# Patient Record
Sex: Female | Born: 1945 | Race: White | Hispanic: No
Health system: Midwestern US, Community
[De-identification: ages and names within clinical notes are randomized; demographics above are authoritative.]

## PROBLEM LIST (undated history)

## (undated) DIAGNOSIS — Z860100 Personal history of colon polyps, unspecified: Secondary | ICD-10-CM

## (undated) DIAGNOSIS — T7840XA Allergy, unspecified, initial encounter: Secondary | ICD-10-CM

## (undated) DIAGNOSIS — E785 Hyperlipidemia, unspecified: Secondary | ICD-10-CM

## (undated) DIAGNOSIS — J302 Other seasonal allergic rhinitis: Secondary | ICD-10-CM

## (undated) DIAGNOSIS — N200 Calculus of kidney: Secondary | ICD-10-CM

## (undated) DIAGNOSIS — Z789 Other specified health status: Secondary | ICD-10-CM

## (undated) DIAGNOSIS — G4733 Obstructive sleep apnea (adult) (pediatric): Secondary | ICD-10-CM

## (undated) DIAGNOSIS — D519 Vitamin B12 deficiency anemia, unspecified: Secondary | ICD-10-CM

## (undated) DIAGNOSIS — M1990 Unspecified osteoarthritis, unspecified site: Secondary | ICD-10-CM

## (undated) DIAGNOSIS — E119 Type 2 diabetes mellitus without complications: Secondary | ICD-10-CM

## (undated) DIAGNOSIS — G4730 Sleep apnea, unspecified: Secondary | ICD-10-CM

## (undated) DIAGNOSIS — Z952 Presence of prosthetic heart valve: Secondary | ICD-10-CM

## (undated) DIAGNOSIS — R011 Cardiac murmur, unspecified: Secondary | ICD-10-CM

## (undated) DIAGNOSIS — Z5189 Encounter for other specified aftercare: Secondary | ICD-10-CM

## (undated) DIAGNOSIS — N201 Calculus of ureter: Secondary | ICD-10-CM

## (undated) DIAGNOSIS — Z87442 Personal history of urinary calculi: Secondary | ICD-10-CM

## (undated) DIAGNOSIS — I10 Essential (primary) hypertension: Secondary | ICD-10-CM

## (undated) DIAGNOSIS — J4520 Mild intermittent asthma, uncomplicated: Secondary | ICD-10-CM

## (undated) HISTORY — PX: EXTRACORPOREAL SHOCK WAVE LITHOTRIPSY: SHX1557

## (undated) HISTORY — PX: UPPER GASTROINTESTINAL ENDOSCOPY: SHX188

## (undated) HISTORY — DX: Essential (primary) hypertension: I10

## (undated) HISTORY — DX: Unspecified osteoarthritis, unspecified site: M19.90

## (undated) HISTORY — PX: CARDIAC VALVE REPLACEMENT: SHX585

## (undated) HISTORY — PX: EYE SURGERY: SHX253

## (undated) HISTORY — DX: Sleep apnea, unspecified: G47.30

## (undated) HISTORY — DX: Encounter for other specified aftercare: Z51.89

## (undated) HISTORY — PX: AORTIC VALVE REPLACEMENT: SHX41

## (undated) HISTORY — PX: COLONOSCOPY: SHX174

## (undated) HISTORY — DX: Cardiac murmur, unspecified: R01.1

## (undated) HISTORY — DX: Other specified health status: Z78.9

## (undated) HISTORY — PX: POLYPECTOMY: SHX149

## (undated) HISTORY — DX: Allergy, unspecified, initial encounter: T78.40XA

## (undated) HISTORY — PX: ESOPHAGOGASTRODUODENOSCOPY: SHX1529

---
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
## 1976-12-09 HISTORY — PX: CHOLECYSTECTOMY: SHX55

## 2006-11-16 ENCOUNTER — Emergency Department: Payer: Self-pay | Admitting: Internal Medicine

## 2006-12-09 LAB — HM DEXA SCAN

## 2007-01-07 ENCOUNTER — Ambulatory Visit: Payer: Self-pay | Admitting: Family Medicine

## 2007-01-08 ENCOUNTER — Ambulatory Visit: Payer: Self-pay | Admitting: Family Medicine

## 2007-01-13 ENCOUNTER — Ambulatory Visit: Payer: Self-pay | Admitting: Specialist

## 2007-02-24 ENCOUNTER — Ambulatory Visit: Payer: Self-pay | Admitting: Gastroenterology

## 2007-02-24 ENCOUNTER — Encounter: Payer: Self-pay | Admitting: Gastroenterology

## 2007-06-18 ENCOUNTER — Ambulatory Visit: Payer: Self-pay

## 2007-06-18 LAB — CONVERTED CEMR LAB
Cholesterol: 235 mg/dL (ref 0–200)
HDL: 39.6 mg/dL (ref >39.0)
Triglycerides: 143 mg/dL (ref 0–149)

## 2007-10-02 ENCOUNTER — Other Ambulatory Visit: Payer: Self-pay

## 2007-10-02 ENCOUNTER — Emergency Department: Payer: Self-pay | Admitting: Emergency Medicine

## 2007-11-14 ENCOUNTER — Emergency Department: Payer: Self-pay | Admitting: Emergency Medicine

## 2008-07-25 DIAGNOSIS — L259 Unspecified contact dermatitis, unspecified cause: Secondary | ICD-10-CM

## 2009-04-26 ENCOUNTER — Emergency Department: Payer: Self-pay | Admitting: Emergency Medicine

## 2009-05-04 ENCOUNTER — Encounter: Payer: Self-pay | Admitting: Gastroenterology

## 2009-07-28 ENCOUNTER — Ambulatory Visit: Payer: Self-pay | Admitting: Gastroenterology

## 2009-07-28 DIAGNOSIS — R1012 Left upper quadrant pain: Secondary | ICD-10-CM

## 2009-07-28 LAB — CONVERTED CEMR LAB
ALT: 11 units/L (ref 0–35)
AST: 17 units/L (ref 0–37)
Alkaline Phosphatase: 39 units/L (ref 39–117)
Basophils Absolute: 0 10*3/uL (ref 0.0–0.1)
CO2: 32 meq/L (ref 19–32)
Creatinine, Ser: 0.6 mg/dL (ref 0.4–1.2)
Eosinophils Absolute: 0.1 10*3/uL (ref 0.0–0.7)
GFR calc non Af Amer: 107.54 mL/min (ref >60)
HCT: 40.6 % (ref 36.0–46.0)
Hemoglobin: 13.4 g/dL (ref 12.0–15.0)
Lymphs Abs: 1.2 10*3/uL (ref 0.7–4.0)
MCHC: 33 g/dL (ref 30.0–36.0)
Monocytes Absolute: 0.4 10*3/uL (ref 0.1–1.0)
Neutro Abs: 3.1 10*3/uL (ref 1.4–7.7)
Platelets: 108 10*3/uL — ABNORMAL LOW (ref 150.0–400.0)
RDW: 13.8 % (ref 11.5–14.6)
Sodium: 141 meq/L (ref 135–145)
Total Bilirubin: 0.9 mg/dL (ref 0.3–1.2)

## 2009-08-01 ENCOUNTER — Telehealth (INDEPENDENT_AMBULATORY_CARE_PROVIDER_SITE_OTHER): Payer: Self-pay | Admitting: *Deleted

## 2009-08-17 ENCOUNTER — Telehealth: Payer: Self-pay | Admitting: Gastroenterology

## 2009-08-17 ENCOUNTER — Ambulatory Visit: Payer: Self-pay | Admitting: Gastroenterology

## 2009-08-23 ENCOUNTER — Encounter: Payer: Self-pay | Admitting: Gastroenterology

## 2009-08-23 ENCOUNTER — Ambulatory Visit: Payer: Self-pay | Admitting: Gastroenterology

## 2009-08-24 ENCOUNTER — Encounter (INDEPENDENT_AMBULATORY_CARE_PROVIDER_SITE_OTHER): Payer: Self-pay | Admitting: *Deleted

## 2010-02-14 ENCOUNTER — Encounter (INDEPENDENT_AMBULATORY_CARE_PROVIDER_SITE_OTHER): Payer: Self-pay | Admitting: *Deleted

## 2010-09-12 ENCOUNTER — Telehealth: Payer: Self-pay | Admitting: Gastroenterology

## 2010-10-18 ENCOUNTER — Encounter (INDEPENDENT_AMBULATORY_CARE_PROVIDER_SITE_OTHER): Payer: Self-pay | Admitting: *Deleted

## 2010-12-24 ENCOUNTER — Encounter (INDEPENDENT_AMBULATORY_CARE_PROVIDER_SITE_OTHER): Payer: Self-pay | Admitting: *Deleted

## 2010-12-27 ENCOUNTER — Ambulatory Visit
Admission: RE | Admit: 2010-12-27 | Discharge: 2010-12-27 | Payer: Self-pay | Source: Home / Self Care | Attending: Gastroenterology | Admitting: Gastroenterology

## 2010-12-27 ENCOUNTER — Ambulatory Visit: Admit: 2010-12-27 | Payer: Self-pay | Admitting: Gastroenterology

## 2011-01-09 NOTE — Letter (Signed)
Summary: Pre Visit Letter Revised  [facility]  [address] [place], [place] [address]   Phone: [phone]  Fax: [phone]      10/18/2010 MRN: [id number]    [facility] [address] [place], [place]  [address]             Procedure Date:  January 09, 2011   Welcome to the Gastroenterology Division at [facility].    You are scheduled to see a nurse for your pre-procedure visit on THURSDAY, December 27, 2010 at 8:00 A.M. on the 3rd floor at [facility], [address]  We ask that you try to arrive at our office 15 minutes prior to your appointment time to allow for check-in.  Please take a minute to review the attached form.  If you answer "Yes" to one or more of the questions on the first page, we ask that you call the person listed at your earliest opportunity.  If you answer "No" to all of the questions, please complete the rest of the form and bring it to your appointment.    Your nurse visit will consist of discussing your medical and surgical history, your immediate family medical history, and your medications.   If you are unable to list all of your medications on the form, please bring the medication bottles to your appointment and we will list them.  We will need to be aware of both prescribed and over the counter drugs.  We will need to know exact dosage information as well.    Please be prepared to read and sign documents such as consent forms, a financial agreement, and acknowledgement forms.  If necessary, and with your consent, a friend or relative is welcome to sit-in on the nurse visit with you.  Please bring your insurance card so that we may make a copy of it.  If your insurance requires a referral to see a specialist, please bring your referral form from your primary care physician.  No co-pay is required for this nurse visit.     If you cannot keep your appointment, please call [phone] to cancel or reschedule prior to  your appointment date.  This allows [place] the opportunity to schedule an appointment for another patient in need of care.    Thank you for choosing [facility] for your medical needs.  We appreciate the opportunity to care for you.  Please visit [place] at our website  to learn more about our practice.  Sincerely, The Gastroenterology Division

## 2011-01-09 NOTE — Progress Notes (Signed)
Summary: Schedule Colonoscopy or Office Visit  Phone Note Outgoing Call Call back at [facility] Phone [phone] Call back at Work Phone [phone]   Call placed by: Harlow Mares CMA Duncan Dull),  September 12, 2010 9:05 AM Call placed to: Patient Summary of Call: patients home number is disconnected I called her work number and left a message on her voicemail. patient ns her last office visit and is due for a colonoscopy. She can make either appt. but can have a direct colonoscopy if she wants to just not have another ov. Left a message on patients machine to call back.  Initial call taken by: Harlow Mares CMA Duncan Dull),  September 12, 2010 9:06 AM  Follow-up for Phone Call        left another voicemail on the patients work voicemail.  Left a message on the patient machine to call back and schedule a previsit and procedure with our office. A letter will be mailed to the patient.   Follow-up by: Harlow Mares CMA Duncan Dull),  September 21, 2010 8:53 AM

## 2011-01-09 NOTE — Letter (Signed)
Summary: Colonoscopy Letter  [facility]  [address] [place], [place] [address]   Phone: [phone]  Fax: [phone]      February 14, 2010 MRN: [id number]   [facility] [address] [place], [place]  [address]   Dear Ms. Boothe,   According to your medical record, it is time for you to schedule a Colonoscopy. The American Cancer Society recommends this procedure as a method to detect early colon cancer. Patients with a family history of colon cancer, or a personal history of colon polyps or inflammatory bowel disease are at increased risk.  This letter has been generated based on the recommendations made at the time of your procedure. If you feel that in your particular situation this may no longer apply, please contact our office.  Please call our office at [phone] to schedule this appointment or to update your records at your earliest convenience.  Thank you for cooperating with [place] to provide you with the very best care possible.   Sincerely,  Rachael Fee, M.D.  [facility] Gastroenterology Division [phone]

## 2011-01-10 NOTE — Letter (Signed)
Summary: Moviprep Instructions  [facility]  [address]   [place], [place] [address]   Phone: [phone]  Fax: [phone]       Bailey [name]    03-Jan-1946    MRN: [id number]        Procedure Day /Date: Friday 01-18-11     Arrival Time: 9:00 a.m.     Procedure Time: 10:00 a.m.     Location of Procedure:                     x   [facility] (4th Floor)                        PREPARATION FOR COLONOSCOPY WITH MOVIPREP   Starting 5 days prior to your procedure 01-13-11 do not eat nuts, seeds, popcorn, corn, beans, peas,  salads, or any raw vegetables.  Do not take any fiber supplements (e.g. Metamucil, Citrucel, and Benefiber).  THE DAY BEFORE YOUR PROCEDURE         DATE: 01-17-11   DAY: Thursday  1.  Drink clear liquids the entire day-NO SOLID FOOD  2.  Do not drink anything colored red or purple.  Avoid juices with pulp.  No orange juice.  3.  Drink at least 64 oz. (8 glasses) of fluid/clear liquids during the day to prevent dehydration and help the prep work efficiently.  CLEAR LIQUIDS INCLUDE: Water Jello Ice Popsicles Tea (sugar ok, no milk/cream) Powdered fruit flavored drinks Coffee (sugar ok, no milk/cream) Gatorade Juice: apple, white grape, white cranberry  Lemonade Clear bullion, consomm, broth Carbonated beverages (any kind) Strained chicken noodle soup Hard Candy                             4.  In the morning, mix first dose of MoviPrep solution:    Empty 1 Pouch A and 1 Pouch B into the disposable container    Add lukewarm drinking water to the top line of the container. Mix to dissolve    Refrigerate (mixed solution should be used within 24 hrs)  5.  Begin drinking the prep at 5:00 p.m. The MoviPrep container is divided by 4 marks.   Every 15 minutes drink the solution down to the next mark (approximately 8 oz) until the full liter is complete.   6.  Follow completed prep with 16 oz of clear liquid of your choice  (Nothing red or purple).  Continue to drink clear liquids until bedtime.  7.  Before going to bed, mix second dose of MoviPrep solution:    Empty 1 Pouch A and 1 Pouch B into the disposable container    Add lukewarm drinking water to the top line of the container. Mix to dissolve    Refrigerate  THE DAY OF YOUR PROCEDURE      DATE: 01-18-11 DAY: Friday  Beginning at 5:00 a.m. (5 hours before procedure):         1. Every 15 minutes, drink the solution down to the next mark (approx 8 oz) until the full liter is complete.  2. Follow completed prep with 16 oz. of clear liquid of your choice.    3. You may drink clear liquids until 8:00 a.m. (2 HOURS BEFORE PROCEDURE).   MEDICATION INSTRUCTIONS  Unless otherwise instructed, you should take regular prescription medications with a small sip of water   as early as possible the  morning of your procedure.       OTHER INSTRUCTIONS  You will need a responsible adult at least 64 years of age to accompany you and drive you home.   This person must remain in the waiting room during your procedure.  Wear loose fitting clothing that is easily removed.  Leave jewelry and other valuables at home.  However, you may wish to bring a book to read or  an iPod/MP3 player to listen to music as you wait for your procedure to start.  Remove all body piercing jewelry and leave at home.  Total time from sign-in until discharge is approximately 2-3 hours.  You should go home directly after your procedure and rest.  You can resume normal activities the  day after your procedure.  The day of your procedure you should not:   Drive   Make legal decisions   Operate machinery   Drink alcohol   Return to work  You will receive specific instructions about eating, activities and medications before you leave.    The above instructions have been reviewed and explained to me by   Durwin Glaze RN  December 27, 2010 9:19 AM     I fully understand  and can verbalize these instructions _____________________________ Date _________

## 2011-01-10 NOTE — Miscellaneous (Signed)
Summary: [facility]  Clinical Lists Changes  Medications: Added new medication of MOVIPREP 100 GM  SOLR (PEG-KCL-NACL-NASULF-NA ASC-C) As per prep instructions. - Signed Added new medication of MOVIPREP 100 GM  SOLR (PEG-KCL-NACL-NASULF-NA ASC-C) As per prep instructions. - Signed Rx of MOVIPREP 100 GM  SOLR (PEG-KCL-NACL-NASULF-NA ASC-C) As per prep instructions.;  #1 x 0;  Signed;  Entered by: Durwin Glaze RN;  Authorized by: Rachael Fee MD;  Method used: Electronically to [facility] # [phone]*, [address], [place], [place]  [address], Ph: [phone], Fax: [phone] Rx of MOVIPREP 100 GM  SOLR (PEG-KCL-NACL-NASULF-NA ASC-C) As per prep instructions.;  #1 x 0;  Signed;  Entered by: Durwin Glaze RN;  Authorized by: Rachael Fee MD;  Method used: Electronically to [facility] # [phone]*, [address], [place], [place]  [address], Ph: [phone], Fax: [phone] Observations: Added new observation of ALLERGY REV: Done (12/27/2010 8:43)    Prescriptions: MOVIPREP 100 GM  SOLR (PEG-KCL-NACL-NASULF-NA ASC-C) As per prep instructions.  #1 x 0   Entered by:   Durwin Glaze RN   Authorized by:   Rachael Fee MD   Signed by:   Durwin Glaze RN on 12/27/2010   Method used:   Electronically to        [facility] # [phone]* (retail)       [address]       [place], [place]  [address]       Ph: [phone]       Fax: [phone]   RxID:   [id number] MOVIPREP 100 GM  SOLR (PEG-KCL-NACL-NASULF-NA ASC-C) As per prep instructions.  #1 x 0   Entered by:   Durwin Glaze RN   Authorized by:   Rachael Fee MD   Signed by:   Durwin Glaze RN on 12/27/2010   Method used:   Electronically to        [facility] # [phone]* (retail)       [address]       [place], [place]  [address]       Ph: [phone]       Fax: [phone]   RxID:   [id number]

## 2011-01-17 ENCOUNTER — Encounter (INDEPENDENT_AMBULATORY_CARE_PROVIDER_SITE_OTHER): Payer: Self-pay | Admitting: *Deleted

## 2011-01-17 ENCOUNTER — Telehealth (INDEPENDENT_AMBULATORY_CARE_PROVIDER_SITE_OTHER): Payer: Self-pay | Admitting: *Deleted

## 2011-01-17 DIAGNOSIS — R69 Illness, unspecified: Secondary | ICD-10-CM

## 2011-01-18 ENCOUNTER — Other Ambulatory Visit: Payer: Self-pay | Admitting: Gastroenterology

## 2011-01-24 NOTE — Letter (Signed)
Summary: [facility] Instructions  [facility]  [address] [place], [place] [address]   Phone: [phone]  Fax: [phone]       Bailey [name]    23-Oct-1946    MRN: [id number]        Procedure Day /Date: Tuesday 02/19/2011      Arrival Time: 1:00 pm     Procedure Time: 2:00 pm     Location of Procedure:                    _x _  [facility] (4th Floor)   PREPARATION FOR COLONOSCOPY WITH MOVIPREP   Starting 5 days prior to your procedure Thursday 3/8 do not eat nuts, seeds, popcorn, corn, beans, peas,  salads, or any raw vegetables.  Do not take any fiber supplements (e.g. Metamucil, Citrucel, and Benefiber).  THE DAY BEFORE YOUR PROCEDURE         DATE: Monday 3/12  1.  Drink clear liquids the entire day-NO SOLID FOOD  2.  Do not drink anything colored red or purple.  Avoid juices with pulp.  No orange juice.  3.  Drink at least 64 oz. (8 glasses) of fluid/clear liquids during the day to prevent dehydration and help the prep work efficiently.  CLEAR LIQUIDS INCLUDE: Water Jello Ice Popsicles Tea (sugar ok, no milk/cream) Powdered fruit flavored drinks Coffee (sugar ok, no milk/cream) Gatorade Juice: apple, white grape, white cranberry  Lemonade Clear bullion, consomm, broth Carbonated beverages (any kind) Strained chicken noodle soup Hard Candy                             4.  In the morning, mix first dose of MoviPrep solution:    Empty 1 Pouch A and 1 Pouch B into the disposable container    Add lukewarm drinking water to the top line of the container. Mix to dissolve    Refrigerate (mixed solution should be used within 24 hrs)  5.  Begin drinking the prep at 5:00 p.m. The MoviPrep container is divided by 4 marks.   Every 15 minutes drink the solution down to the next mark (approximately 8 oz) until the full liter is complete.   6.  Follow completed prep with 16 oz of clear liquid of your choice (Nothing red or purple).   Continue to drink clear liquids until bedtime.  7.  Before going to bed, mix second dose of MoviPrep solution:    Empty 1 Pouch A and 1 Pouch B into the disposable container    Add lukewarm drinking water to the top line of the container. Mix to dissolve    Refrigerate  THE DAY OF YOUR PROCEDURE      DATE: Tuesday 3/13  Beginning at 9:00 a.m. (5 hours before procedure):         1. Every 15 minutes, drink the solution down to the next mark (approx 8 oz) until the full liter is complete.  2. Follow completed prep with 16 oz. of clear liquid of your choice.    3. You may drink clear liquids until 12:00 pm (2 HOURS BEFORE PROCEDURE).   MEDICATION INSTRUCTIONS  Unless otherwise instructed, you should take regular prescription medications with a small sip of water   as early as possible the morning of your procedure.  Diabetic patients - see separate instructions.          OTHER INSTRUCTIONS  You  will need a responsible adult at least 64 years of age to accompany you and drive you home.   This person must remain in the waiting room during your procedure.  Wear loose fitting clothing that is easily removed.  Leave jewelry and other valuables at home.  However, you may wish to bring a book to read or  an iPod/MP3 player to listen to music as you wait for your procedure to start.  Remove all body piercing jewelry and leave at home.  Total time from sign-in until discharge is approximately 2-3 hours.  You should go home directly after your procedure and rest.  You can resume normal activities the  day after your procedure.  The day of your procedure you should not:   Drive   Make legal decisions   Operate machinery   Drink alcohol   Return to work  You will receive specific instructions about eating, activities and medications before you leave.    The above instructions have been reviewed and explained to me by   _______________________    I fully  understand and can verbalize these instructions _____________________________ Date _________  INSTRUCTIONS MAILED TO PT.  SHE WILL CALL PREVISIT TO REVIEW INSTRUCTIONS OVER TELEPHONE. [phone]

## 2011-01-24 NOTE — Letter (Signed)
Summary: Diabetic Instructions  [facility]  [address] [place], [place] [address]   Phone: [phone]  Fax: [phone]    SHERECE GAMBRILL 1946/05/07 MRN: [id number]   _  _   ORAL DIABETIC MEDICATION INSTRUCTIONS  The day before your procedure:   Take your diabetic pill as you do normally  The day of your procedure:   Do not take your diabetic pill    We will check your blood sugar levels during the admission process and again in Recovery before discharging you home  ________________________________________________________________________

## 2011-01-31 ENCOUNTER — Telehealth (INDEPENDENT_AMBULATORY_CARE_PROVIDER_SITE_OTHER): Payer: Self-pay | Admitting: *Deleted

## 2011-02-05 NOTE — Progress Notes (Signed)
Summary: speak to nurse  Phone Note Call from Patient Call back at Home Phone [phone]   Caller: Patient Call For: Dr Christella Hartigan Reason for Call: Talk to Nurse Summary of Call: Patient wants to speak directly to nurse,. Initial call taken by: Tawni Levy,  January 31, 2011 2:20 PM  Follow-up for Phone Call        Moviprep instructions and diabetic instructions reviewed w/ pt over the phone.  Pt verbalized understanding Follow-up by: Ezra Sites RN,  January 31, 2011 2:53 PM

## 2011-02-19 ENCOUNTER — Other Ambulatory Visit (AMBULATORY_SURGERY_CENTER): Payer: BC Managed Care – PPO | Admitting: Gastroenterology

## 2011-02-19 ENCOUNTER — Other Ambulatory Visit: Payer: Self-pay | Admitting: Gastroenterology

## 2011-02-19 DIAGNOSIS — D126 Benign neoplasm of colon, unspecified: Secondary | ICD-10-CM

## 2011-02-19 DIAGNOSIS — K644 Residual hemorrhoidal skin tags: Secondary | ICD-10-CM

## 2011-02-19 DIAGNOSIS — Z86010 Personal history of colonic polyps: Secondary | ICD-10-CM

## 2011-02-19 LAB — GLUCOSE, CAPILLARY
Glucose-Capillary: 107 mg/dL — ABNORMAL HIGH (ref 70–99)
Glucose-Capillary: 100 mg/dL — ABNORMAL HIGH (ref 70–99)

## 2011-02-19 NOTE — Progress Notes (Signed)
Summary: Cancelled colon/? cancellation fee  Phone Note Call from Patient   Caller: Patient Reason for Call: Talk to Nurse Summary of Call: Dr. Christella Hartigan: Pt is scheduled for colonoscopy tomorrow 2/10.  She lost prep instructions and has not followed guidlelines for prep today.  She rescheduled colonoscopy for next month.  Should she be charged cancellation fee? Initial call taken by: Ezra Sites RN,  January 17, 2011 2:12 PM  Follow-up for Phone Call        yes, it looks like she was scheduled for colonoscopy a week ago and rescheduled once already. Follow-up by: Rachael Fee MD,  January 17, 2011 2:53 PM  Additional Follow-up for Phone Call Additional follow up Details #1::        Will forward to [facility]. Additional Follow-up by: Ezra Sites RN,  January 17, 2011 2:59 PM    Additional Follow-up for Phone Call Additional follow up Details #2::    Patient BILLED Colon Cx fee $100. Follow-up by: Leanor Kail [facility],  February 13, 2011 8:34 AM

## 2011-02-26 ENCOUNTER — Encounter: Payer: Self-pay | Admitting: Gastroenterology

## 2011-02-26 NOTE — Procedures (Addendum)
Summary: Colonoscopy  Patient: [name] Note: All result statuses are Final unless otherwise noted.  Tests: (1) Colonoscopy (COL)   COL Colonoscopy           DONE     [facility]     [address]     [place], [place]  [address]          COLONOSCOPY PROCEDURE REPORT          PATIENT:  Bailey [name], Bailey [name]  MR#:  [id number]     BIRTHDATE:  1946/09/26, 64 yrs. old  GENDER:  female     ENDOSCOPIST:  Rachael Fee, MD     PROCEDURE DATE:  02/19/2011     PROCEDURE:  Colonoscopy with snare polypectomy     ASA CLASS:  Class II     INDICATIONS:  history of pre-cancerous (adenomatous) colon polyps,     adenomoatous polyps removed by Dr. Servando Snare in 2008, he recommended     repeat colonoscopy at 3 year interval     MEDICATIONS:   Fentanyl 100 mcg IV, Versed 10 mg IV     DESCRIPTION OF PROCEDURE:   After the risks benefits and     alternatives of the procedure were thoroughly explained, informed     consent was obtained.  Digital rectal exam was performed and     revealed no rectal masses.   The LB [id number]-[id number] endoscope     was introduced through the anus and advanced to the cecum, which     was identified by both the appendix and ileocecal valve, without     limitations.  The quality of the prep was good, using MoviPrep.     The instrument was then slowly withdrawn as the colon was fully     examined.     <<PROCEDUREIMAGES>>     FINDINGS:  Three diminutive polyps were found (transverse,     descending an sigmoid segments). All were removed with cold snare     and all were sent to pathology (jar 1) (see image2 and image3).     External Hemorrhoids were found.  This was otherwise a normal     examination of the colon (see image1 and image4).   Retroflexed     views in the rectum revealed no abnormalities.    The scope was     then withdrawn from the patient and the procedure completed.     COMPLICATIONS:  None          ENDOSCOPIC IMPRESSION:     1) Three small polyps  removed, all were sent to pathology     2) External hemorrhoids     3) Otherwise normal examination          RECOMMENDATIONS:     1) If the polyp(s) removed today are proven to be adenomatous     (pre-cancerous) polyps, you will need a colonoscopy in 3-5 years.          2) You will receive a letter within 1-2 weeks with the results     of your biopsy as well as final recommendations. Please call my     office if you have not received a letter after 3 weeks.          ______________________________     Rachael Fee, MD          n.     eSIGNED:   Rachael Fee at 02/19/2011 02:25 PM  [name], [name], [phone]  Note: An exclamation mark (!) indicates a result that was not dispersed into the flowsheet. Document Creation Date: 02/19/2011 2:25 PM _______________________________________________________________________  (1) Order result status: Final Collection or observation date-time: 02/19/2011 14:20 Requested date-time:  Receipt date-time:  Reported date-time:  Referring Physician:   Ordering Physician: Rob Bunting [id number]) Specimen Source:  Source: Launa Grill Order Number: [id number] Lab site:   Appended Document: Colonoscopy     Procedures Next Due Date:    Colonoscopy: 02/2014

## 2011-03-07 NOTE — Letter (Signed)
Summary: Results Letter  [facility]  [address] [place], [place] [address]   Phone: [phone]  Fax: [phone]        February 26, 2011 MRN: [id number]    Bailey [name] [address] [place], [place]  [address]    Dear Ms. Digangi,   The polyps removed during your recent procedure were proven to be adenomatous.  These are pre-cancerous polyps that may have grown into cancers if they had not been removed.  Based on current nationally recognized surveillance guidelines, I recommend that you have a repeat colonoscopy in 3 years.   We will therefore put your information in our reminder system and will contact you in 3 years to schedule a repeat procedure.  Please call if you have any questions or concerns.       Sincerely,  Rachael Fee MD  This letter has been electronically signed by your physician.  Appended Document: Results Letter letter mailed

## 2011-03-15 LAB — GLUCOSE, CAPILLARY: Glucose-Capillary: 83 mg/dL (ref 70–99)

## 2011-05-29 ENCOUNTER — Encounter: Payer: Self-pay | Admitting: Cardiovascular Disease

## 2011-09-26 ENCOUNTER — Other Ambulatory Visit: Payer: Self-pay | Admitting: Family Medicine

## 2011-09-26 DIAGNOSIS — Z1231 Encounter for screening mammogram for malignant neoplasm of breast: Secondary | ICD-10-CM

## 2011-10-14 ENCOUNTER — Ambulatory Visit: Payer: BC Managed Care – PPO

## 2011-12-10 DIAGNOSIS — Z952 Presence of prosthetic heart valve: Secondary | ICD-10-CM

## 2011-12-10 HISTORY — DX: Presence of prosthetic heart valve: Z95.2

## 2012-05-13 DIAGNOSIS — E669 Obesity, unspecified: Secondary | ICD-10-CM | POA: Diagnosis not present

## 2012-05-13 DIAGNOSIS — K8689 Other specified diseases of pancreas: Secondary | ICD-10-CM | POA: Diagnosis not present

## 2012-05-13 DIAGNOSIS — Z5181 Encounter for therapeutic drug level monitoring: Secondary | ICD-10-CM | POA: Diagnosis not present

## 2012-05-13 DIAGNOSIS — E119 Type 2 diabetes mellitus without complications: Secondary | ICD-10-CM | POA: Diagnosis not present

## 2012-05-13 DIAGNOSIS — R0602 Shortness of breath: Secondary | ICD-10-CM | POA: Diagnosis not present

## 2012-05-13 DIAGNOSIS — I359 Nonrheumatic aortic valve disorder, unspecified: Secondary | ICD-10-CM | POA: Diagnosis not present

## 2012-05-13 DIAGNOSIS — I10 Essential (primary) hypertension: Secondary | ICD-10-CM | POA: Diagnosis not present

## 2012-05-13 DIAGNOSIS — E785 Hyperlipidemia, unspecified: Secondary | ICD-10-CM | POA: Diagnosis not present

## 2012-05-13 DIAGNOSIS — Z01810 Encounter for preprocedural cardiovascular examination: Secondary | ICD-10-CM | POA: Diagnosis not present

## 2012-06-03 DIAGNOSIS — E119 Type 2 diabetes mellitus without complications: Secondary | ICD-10-CM | POA: Diagnosis not present

## 2012-06-03 DIAGNOSIS — R0789 Other chest pain: Secondary | ICD-10-CM | POA: Diagnosis not present

## 2012-06-03 DIAGNOSIS — I10 Essential (primary) hypertension: Secondary | ICD-10-CM | POA: Diagnosis not present

## 2012-06-03 DIAGNOSIS — Z79899 Other long term (current) drug therapy: Secondary | ICD-10-CM | POA: Diagnosis not present

## 2012-06-03 DIAGNOSIS — Z049 Encounter for examination and observation for unspecified reason: Secondary | ICD-10-CM | POA: Diagnosis not present

## 2012-06-03 DIAGNOSIS — I359 Nonrheumatic aortic valve disorder, unspecified: Secondary | ICD-10-CM | POA: Diagnosis not present

## 2012-06-03 DIAGNOSIS — R0602 Shortness of breath: Secondary | ICD-10-CM | POA: Diagnosis not present

## 2012-06-04 DIAGNOSIS — I10 Essential (primary) hypertension: Secondary | ICD-10-CM | POA: Diagnosis not present

## 2012-06-04 DIAGNOSIS — E119 Type 2 diabetes mellitus without complications: Secondary | ICD-10-CM | POA: Diagnosis not present

## 2012-06-04 DIAGNOSIS — R0602 Shortness of breath: Secondary | ICD-10-CM | POA: Diagnosis not present

## 2012-06-04 DIAGNOSIS — R0789 Other chest pain: Secondary | ICD-10-CM | POA: Diagnosis not present

## 2012-06-04 DIAGNOSIS — Z79899 Other long term (current) drug therapy: Secondary | ICD-10-CM | POA: Diagnosis not present

## 2012-06-04 DIAGNOSIS — I359 Nonrheumatic aortic valve disorder, unspecified: Secondary | ICD-10-CM | POA: Diagnosis not present

## 2012-06-14 DIAGNOSIS — Z049 Encounter for examination and observation for unspecified reason: Secondary | ICD-10-CM | POA: Diagnosis not present

## 2012-06-14 DIAGNOSIS — I359 Nonrheumatic aortic valve disorder, unspecified: Secondary | ICD-10-CM | POA: Diagnosis not present

## 2012-06-15 DIAGNOSIS — E785 Hyperlipidemia, unspecified: Secondary | ICD-10-CM | POA: Diagnosis present

## 2012-06-15 DIAGNOSIS — J45909 Unspecified asthma, uncomplicated: Secondary | ICD-10-CM | POA: Diagnosis present

## 2012-06-15 DIAGNOSIS — J811 Chronic pulmonary edema: Secondary | ICD-10-CM | POA: Diagnosis not present

## 2012-06-15 DIAGNOSIS — M1990 Unspecified osteoarthritis, unspecified site: Secondary | ICD-10-CM | POA: Diagnosis present

## 2012-06-15 DIAGNOSIS — Z4682 Encounter for fitting and adjustment of non-vascular catheter: Secondary | ICD-10-CM | POA: Diagnosis not present

## 2012-06-15 DIAGNOSIS — E119 Type 2 diabetes mellitus without complications: Secondary | ICD-10-CM | POA: Diagnosis present

## 2012-06-15 DIAGNOSIS — J90 Pleural effusion, not elsewhere classified: Secondary | ICD-10-CM | POA: Diagnosis not present

## 2012-06-15 DIAGNOSIS — J9819 Other pulmonary collapse: Secondary | ICD-10-CM | POA: Diagnosis not present

## 2012-06-15 DIAGNOSIS — I509 Heart failure, unspecified: Secondary | ICD-10-CM | POA: Diagnosis not present

## 2012-06-15 DIAGNOSIS — R9389 Abnormal findings on diagnostic imaging of other specified body structures: Secondary | ICD-10-CM | POA: Diagnosis not present

## 2012-06-15 DIAGNOSIS — Z952 Presence of prosthetic heart valve: Secondary | ICD-10-CM | POA: Insufficient documentation

## 2012-06-15 DIAGNOSIS — E039 Hypothyroidism, unspecified: Secondary | ICD-10-CM | POA: Diagnosis present

## 2012-06-15 DIAGNOSIS — R918 Other nonspecific abnormal finding of lung field: Secondary | ICD-10-CM | POA: Diagnosis not present

## 2012-06-15 DIAGNOSIS — I517 Cardiomegaly: Secondary | ICD-10-CM | POA: Diagnosis not present

## 2012-06-15 DIAGNOSIS — I10 Essential (primary) hypertension: Secondary | ICD-10-CM | POA: Diagnosis present

## 2012-06-15 DIAGNOSIS — I359 Nonrheumatic aortic valve disorder, unspecified: Secondary | ICD-10-CM | POA: Diagnosis not present

## 2012-07-01 DIAGNOSIS — I359 Nonrheumatic aortic valve disorder, unspecified: Secondary | ICD-10-CM | POA: Diagnosis not present

## 2012-07-01 DIAGNOSIS — J9819 Other pulmonary collapse: Secondary | ICD-10-CM | POA: Diagnosis not present

## 2012-07-06 DIAGNOSIS — R079 Chest pain, unspecified: Secondary | ICD-10-CM | POA: Diagnosis not present

## 2012-07-06 DIAGNOSIS — J9819 Other pulmonary collapse: Secondary | ICD-10-CM | POA: Diagnosis not present

## 2012-07-06 DIAGNOSIS — Z954 Presence of other heart-valve replacement: Secondary | ICD-10-CM | POA: Diagnosis not present

## 2012-07-06 DIAGNOSIS — I519 Heart disease, unspecified: Secondary | ICD-10-CM | POA: Diagnosis not present

## 2012-07-06 DIAGNOSIS — I10 Essential (primary) hypertension: Secondary | ICD-10-CM | POA: Diagnosis not present

## 2012-07-09 ENCOUNTER — Encounter (HOSPITAL_COMMUNITY): Payer: Self-pay

## 2012-07-09 ENCOUNTER — Encounter (HOSPITAL_COMMUNITY)
Admission: RE | Admit: 2012-07-09 | Discharge: 2012-07-09 | Disposition: A | Discharge status: Home / Self Care | Payer: Medicare Other | Source: Ambulatory Visit | Attending: Cardiology | Admitting: Cardiology

## 2012-07-09 DIAGNOSIS — I10 Essential (primary) hypertension: Secondary | ICD-10-CM | POA: Insufficient documentation

## 2012-07-09 DIAGNOSIS — I359 Nonrheumatic aortic valve disorder, unspecified: Secondary | ICD-10-CM | POA: Insufficient documentation

## 2012-07-09 DIAGNOSIS — Z5189 Encounter for other specified aftercare: Secondary | ICD-10-CM | POA: Insufficient documentation

## 2012-07-09 NOTE — Progress Notes (Signed)
Cardiac Rehab Medication Review by a Pharmacist  Does the patient  feel that his/her medications are working for him/her?  yes  Has the patient been experiencing any side effects to the medications prescribed?  no  Does the patient measure his/her own blood pressure or blood glucose at home?  yes   Does the patient have any problems obtaining medications due to transportation or finances?   no  Understanding of regimen: excellent Understanding of indications: excellent Potential of compliance: excellent    Bailey [name] 07/09/2012 9:30 AM             Subjective:    Patient ID: Bailey [name], female    DOB: June 23, 1946, 66 y.o.   MRN: [id number]  HPI    Review of Systems     Objective:   Physical Exam        Assessment & Plan:

## 2012-07-13 ENCOUNTER — Encounter (HOSPITAL_COMMUNITY)
Admission: RE | Admit: 2012-07-13 | Discharge: 2012-07-13 | Disposition: A | Discharge status: Home / Self Care | Payer: Medicare Other | Source: Ambulatory Visit | Attending: Cardiology | Admitting: Cardiology

## 2012-07-13 ENCOUNTER — Encounter (HOSPITAL_COMMUNITY): Payer: Self-pay

## 2012-07-13 DIAGNOSIS — Z5189 Encounter for other specified aftercare: Secondary | ICD-10-CM | POA: Diagnosis not present

## 2012-07-13 DIAGNOSIS — I359 Nonrheumatic aortic valve disorder, unspecified: Secondary | ICD-10-CM | POA: Diagnosis not present

## 2012-07-13 DIAGNOSIS — I10 Essential (primary) hypertension: Secondary | ICD-10-CM | POA: Diagnosis not present

## 2012-07-15 ENCOUNTER — Encounter (HOSPITAL_COMMUNITY)
Admission: RE | Admit: 2012-07-15 | Discharge: 2012-07-15 | Disposition: A | Discharge status: Home / Self Care | Payer: Medicare Other | Source: Ambulatory Visit | Attending: Cardiology | Admitting: Cardiology

## 2012-07-15 NOTE — Progress Notes (Signed)
Pt started cardiac rehab today.  Pt tolerated light exercise without difficulty.  Pt c/o palpitations "heart pounding, hard heart beats" x2days. Pt also had intermittent vague c/o pinching discomfort in chest and shoulder.  Fleeting discomfort, no change with exercise or rest.  Dr. Frutoso Chase office made aware, rhythm strips faxed.  VSS, telemetry-NSR, rare fusion beat.  Pt oriented to exercise equipment and routine.  Understanding verbalized.   Pt expresses anxiety about learning cardiac rehab routine.  Will continue to monitor the patient throughout  the program.

## 2012-07-15 NOTE — Progress Notes (Signed)
Pt c/o "deep" chest discomfort, epigastric area fleeting no change with exercise or rest.  Pt also c/o left scapular discomfort, describes as a dull ache present prior to arriving at cardiac rehab however reports more awareness with activity.  Pt states discomfort is worsened with touch. Pt states she experienced this discomfort in initial post op period and was relieved with pain medication.  Pt reports she spoke to Dr. Frutoso Chase nurse via phone yesterday with no new orders given.  Will continue to monitor the patient throughout  the program.

## 2012-07-17 ENCOUNTER — Encounter (HOSPITAL_COMMUNITY)
Admission: RE | Admit: 2012-07-17 | Discharge: 2012-07-17 | Disposition: A | Discharge status: Home / Self Care | Payer: Medicare Other | Source: Ambulatory Visit | Attending: Cardiology | Admitting: Cardiology

## 2012-07-20 ENCOUNTER — Encounter (HOSPITAL_COMMUNITY)
Admission: RE | Admit: 2012-07-20 | Discharge: 2012-07-20 | Disposition: A | Discharge status: Home / Self Care | Payer: Medicare Other | Source: Ambulatory Visit | Attending: Cardiology | Admitting: Cardiology

## 2012-07-20 NOTE — Progress Notes (Signed)
Pt c/o chest discomfort while bending over.  Pt reports this has been persistent since surgery.  Pt also c/o continued "pounding and hard" heart beats.  Pt report she has discussed this with cardiology office.  Lastly pt c/o shortness of breath associated with asthma   Pt denies using her inhaler today.  Lungs clear, diminished bilateral bases, O2 sat-97%.  Pt encouraged to use her MDI.  Offered to contact cardiologist for appt to discuss sx pt declined stating she would rather call on her own.

## 2012-07-22 ENCOUNTER — Encounter (HOSPITAL_COMMUNITY)
Admission: RE | Admit: 2012-07-22 | Discharge: 2012-07-22 | Disposition: A | Discharge status: Home / Self Care | Payer: Medicare Other | Source: Ambulatory Visit | Attending: Cardiology | Admitting: Cardiology

## 2012-07-24 ENCOUNTER — Encounter (HOSPITAL_COMMUNITY): Payer: Medicare Other

## 2012-07-27 ENCOUNTER — Encounter (HOSPITAL_COMMUNITY)
Admission: RE | Admit: 2012-07-27 | Discharge: 2012-07-27 | Disposition: A | Discharge status: Home / Self Care | Payer: Medicare Other | Source: Ambulatory Visit | Attending: Cardiology | Admitting: Cardiology

## 2012-07-27 NOTE — Progress Notes (Signed)
Reviewed home exercise guidelines with patient including endpoints, temperature guidelines, THR and RPE scale. Pt plans to walk and has access to a fitness room where she lives, pt also has an elliptical at home. Pt voices understanding of instructions given.

## 2012-07-29 ENCOUNTER — Encounter (HOSPITAL_COMMUNITY)
Admission: RE | Admit: 2012-07-29 | Discharge: 2012-07-29 | Disposition: A | Discharge status: Home / Self Care | Payer: Medicare Other | Source: Ambulatory Visit | Attending: Cardiology | Admitting: Cardiology

## 2012-07-31 ENCOUNTER — Encounter (HOSPITAL_COMMUNITY): Payer: Medicare Other

## 2012-08-03 ENCOUNTER — Encounter (HOSPITAL_COMMUNITY): Payer: Medicare Other

## 2012-08-05 ENCOUNTER — Encounter (HOSPITAL_COMMUNITY)
Admission: RE | Admit: 2012-08-05 | Discharge: 2012-08-05 | Disposition: A | Discharge status: Home / Self Care | Payer: Medicare Other | Source: Ambulatory Visit | Attending: Cardiology | Admitting: Cardiology

## 2012-08-05 NOTE — Progress Notes (Signed)
Pt at cardiac rehab post exercise reporting episode of chest discomfort associated with diaphoresis that occurred at work yesterday.  Pt states episode was self relieved. Pt denies sx today at rest or with exercise. Pt states she will call cardiologist today for appt.  Offered to call for her and she declined stating she prefers to call herself.   Pt also reported concern over increased glucose levels at home.  Pt states her fasting CBG this am was 159.  Pt also reports blood present in urine.  Pt advised to present to PCP or urgent care for UTI evaluation.  Understanding verbalized

## 2012-08-05 NOTE — Progress Notes (Signed)
Bailey [name] 66 y.o. female Nutrition Note Spoke with pt.  Nutrition Plan and cholesterol goals reviewed with pt. Pt has not returned her Diabetes test or MEDFICTS survey. Per pt, pt wt is 95 kg today, which is down 5.5# over the past 3 weeks. Rate of wt loss appears safe. Pt states "I know a healthy diet doesn't cause wt loss because I've been following a low fat diet for 2 years and I haven't lost any wt. Weight loss tips, statin intolerance, and low-sodium diet discussed.  Pt is diabetic. No recent A1c noted. Pt c/o having to increase soluble fiber for lower cholesterol and having to watch carbs with DM. The role of high fiber foods in a DM diet reviewed. Pt expressed understanding.  Nutrition Diagnosis   Food-and nutrition-related knowledge deficit related to lack of exposure to information as related to diagnosis of: ? CVD ? DM    Obesity related to excessive energy intake as evidenced by a BMI of 39.3  Nutrition RX/ Estimated Daily Nutrition Needs for: wt loss  1200-1700 Kcal, 30-45 gm fat, 8-13 gm sat fat, 1.2-1.7 gm trans-fat, <1500 mg sodium , 150-175 gm CHO   Nutrition Intervention   Pt's individual nutrition plan including cholesterol goals reviewed with pt.   Benefits of adopting Therapeutic Lifestyle Changes discussed when Medficts reviewed.   Pt to attend the Portion Distortion class   Pt to attend the  ? Nutrition I class                         ? Nutrition II class        ? Diabetes Blitz class        ? Diabetes Q & A class   Pt given handouts for: ? wt loss ? DM ? Heart Healthy Eating fiber tips ? low sodium   Continue client-centered nutrition education by RD, as part of interdisciplinary care. Goal(s)   Pt to identify food quantities necessary to achieve: ? wt loss to a goal wt of 190-202 lb (86.6-92.0 kg) at graduation from cardiac rehab.    Pt to describe the benefit of including fruits, vegetables, whole grains, and low-fat dairy products in a heart healthy meal  plan. Monitor and Evaluate progress toward nutrition goal with team.

## 2012-08-05 NOTE — Progress Notes (Signed)
Pt absent from cardiac rehab today for birth of grandchild

## 2012-08-05 NOTE — Progress Notes (Addendum)
Pt absent from cardiac rehab today to care for grandchild

## 2012-08-07 ENCOUNTER — Encounter (HOSPITAL_COMMUNITY)
Admission: RE | Admit: 2012-08-07 | Discharge: 2012-08-07 | Disposition: A | Discharge status: Home / Self Care | Payer: Medicare Other | Source: Ambulatory Visit | Attending: Cardiology | Admitting: Cardiology

## 2012-08-07 NOTE — Progress Notes (Signed)
Bailey [name] 66 y.o. female Nutrition Note Spoke with pt. Pt has not returned her MEDFICTS food survey or DM Education test. Pt reports she is moving and has not brought in her orientation paperwork. Pt plans on bringing in paperwork on Wed/Fri of next week. Pt is diabetic. Spoke with pt further re: DM management. Pt checks CBG's q am daily. Per pt, her CBG's were 100-105 mg/dL before hospitalization and now they are 115-120 mg/dL. The effects of the healing process and stress on CBG's reviewed. Pt expressed understanding. Nutrition Diagnosis   Food-and nutrition-related knowledge deficit related to lack of exposure to information as related to diagnosis of: ? CVD ? DM    Obesity related to excessive energy intake as evidenced by a BMI of 39.3  Nutrition RX/ Estimated Daily Nutrition Needs for: wt loss  1200-1700 Kcal, 30-45 gm fat, 8-13 gm sat fat, 1.2-1.7 gm trans-fat, <1500 mg sodium , 150-175 gm CHO   Nutrition Intervention   Pt's individual nutrition plan reviewed.   Pt to attend the Portion Distortion class   Pt to attend the  ? Nutrition I class                         ? Nutrition II class        ? Diabetes Blitz class        ? Diabetes Q & A class   Continue client-centered nutrition education by RD, as part of interdisciplinary care. Goal(s)   Pt to identify food quantities necessary to achieve: ? wt loss to a goal wt of 190-202 lb (86.6-92.0 kg) at graduation from cardiac rehab.    Pt to describe the benefit of including fruits, vegetables, whole grains, and low-fat dairy products in a heart healthy meal plan. Monitor and Evaluate progress toward nutrition goal with team.

## 2012-08-10 ENCOUNTER — Encounter (HOSPITAL_COMMUNITY): Payer: Medicare Other

## 2012-08-10 DIAGNOSIS — I10 Essential (primary) hypertension: Secondary | ICD-10-CM | POA: Insufficient documentation

## 2012-08-10 DIAGNOSIS — Z5189 Encounter for other specified aftercare: Secondary | ICD-10-CM | POA: Insufficient documentation

## 2012-08-10 DIAGNOSIS — I359 Nonrheumatic aortic valve disorder, unspecified: Secondary | ICD-10-CM | POA: Insufficient documentation

## 2012-08-12 ENCOUNTER — Encounter (HOSPITAL_COMMUNITY)
Admission: RE | Admit: 2012-08-12 | Discharge: 2012-08-12 | Disposition: A | Discharge status: Home / Self Care | Payer: Medicare Other | Source: Ambulatory Visit | Attending: Cardiology | Admitting: Cardiology

## 2012-08-12 DIAGNOSIS — I359 Nonrheumatic aortic valve disorder, unspecified: Secondary | ICD-10-CM | POA: Diagnosis not present

## 2012-08-12 DIAGNOSIS — Z5189 Encounter for other specified aftercare: Secondary | ICD-10-CM | POA: Diagnosis not present

## 2012-08-12 DIAGNOSIS — I10 Essential (primary) hypertension: Secondary | ICD-10-CM | POA: Diagnosis not present

## 2012-08-14 ENCOUNTER — Encounter (HOSPITAL_COMMUNITY)
Admission: RE | Admit: 2012-08-14 | Discharge: 2012-08-14 | Disposition: A | Discharge status: Home / Self Care | Payer: Medicare Other | Source: Ambulatory Visit | Attending: Cardiology | Admitting: Cardiology

## 2012-08-14 LAB — GLUCOSE, CAPILLARY: Glucose-Capillary: 105 mg/dL — ABNORMAL HIGH (ref 70–99)

## 2012-08-14 NOTE — Progress Notes (Signed)
Pt arrived to cardiac rehab today reporting continued concern about her tenderness and swelling around her sternal incision.  Pt states she did not receive phone call from Dr. Kizzie Bane office, however states she disconnected her home telephone as she moved her residence yesterday. LM with Melissa, Dr. Kizzie Bane office to call pt to discuss.

## 2012-08-17 ENCOUNTER — Encounter (HOSPITAL_COMMUNITY)
Admission: RE | Admit: 2012-08-17 | Discharge: 2012-08-17 | Disposition: A | Discharge status: Home / Self Care | Payer: Medicare Other | Source: Ambulatory Visit | Attending: Cardiology | Admitting: Cardiology

## 2012-08-17 LAB — GLUCOSE, CAPILLARY: Glucose-Capillary: 128 mg/dL — ABNORMAL HIGH (ref 70–99)

## 2012-08-17 NOTE — Progress Notes (Signed)
Pt arrived to cardiac rehab reporting she received PC from Dr. Kizzie Bane office stating it is not necessary for her to have incision checked at this time since her symptoms are part of normal healing process.  Pt continues to have concerns about tenderness and swelling around incision site. Pt also c/o mild dyspnea on more than usual activity when walking briskly across campus at work.  Pt denies such sx at cardiac rehab or other times throughout her daily routine.  Pt denies edema, lungs clear.  LM with Melissa, Dr. Kizzie Bane office to discuss pt concerns

## 2012-08-19 ENCOUNTER — Encounter (HOSPITAL_COMMUNITY)
Admission: RE | Admit: 2012-08-19 | Discharge: 2012-08-19 | Disposition: A | Discharge status: Home / Self Care | Payer: Medicare Other | Source: Ambulatory Visit | Attending: Cardiology | Admitting: Cardiology

## 2012-08-19 LAB — GLUCOSE, CAPILLARY: Glucose-Capillary: 108 mg/dL — ABNORMAL HIGH (ref 70–99)

## 2012-08-19 NOTE — Progress Notes (Signed)
Pt arrived at cardiac rehab reporting she did speak to [facility] at Dr. Kizzie Bane office, was reassured.

## 2012-08-21 ENCOUNTER — Encounter (HOSPITAL_COMMUNITY)
Admission: RE | Admit: 2012-08-21 | Discharge: 2012-08-21 | Disposition: A | Discharge status: Home / Self Care | Payer: Medicare Other | Source: Ambulatory Visit | Attending: Cardiology | Admitting: Cardiology

## 2012-08-24 ENCOUNTER — Encounter (HOSPITAL_COMMUNITY)
Admission: RE | Admit: 2012-08-24 | Discharge: 2012-08-24 | Disposition: A | Discharge status: Home / Self Care | Payer: Medicare Other | Source: Ambulatory Visit | Attending: Cardiology | Admitting: Cardiology

## 2012-08-24 LAB — GLUCOSE, CAPILLARY: Glucose-Capillary: 113 mg/dL — ABNORMAL HIGH (ref 70–99)

## 2012-08-26 ENCOUNTER — Encounter (HOSPITAL_COMMUNITY): Payer: Medicare Other

## 2012-08-28 ENCOUNTER — Encounter (HOSPITAL_COMMUNITY)
Admission: RE | Admit: 2012-08-28 | Discharge: 2012-08-28 | Disposition: A | Discharge status: Home / Self Care | Payer: Medicare Other | Source: Ambulatory Visit | Attending: Cardiology | Admitting: Cardiology

## 2012-08-28 LAB — GLUCOSE, CAPILLARY: Glucose-Capillary: 107 mg/dL — ABNORMAL HIGH (ref 70–99)

## 2012-08-28 NOTE — Progress Notes (Signed)
Pt arrived to cardiac rehab reporting chest wall discomfort on 08/26/12 following slamming a heavy car door.  Pt states sx are now relieved and her previous chest wall discomforts are also relieved.  Will continue to monitor

## 2012-08-31 ENCOUNTER — Encounter (HOSPITAL_COMMUNITY)
Admission: RE | Admit: 2012-08-31 | Discharge: 2012-08-31 | Disposition: A | Discharge status: Home / Self Care | Payer: Medicare Other | Source: Ambulatory Visit | Attending: Cardiology | Admitting: Cardiology

## 2012-09-02 ENCOUNTER — Encounter (HOSPITAL_COMMUNITY)
Admission: RE | Admit: 2012-09-02 | Discharge: 2012-09-02 | Disposition: A | Discharge status: Home / Self Care | Payer: Medicare Other | Source: Ambulatory Visit | Attending: Cardiology | Admitting: Cardiology

## 2012-09-04 ENCOUNTER — Encounter (HOSPITAL_COMMUNITY)
Admission: RE | Admit: 2012-09-04 | Discharge: 2012-09-04 | Disposition: A | Discharge status: Home / Self Care | Payer: Medicare Other | Source: Ambulatory Visit | Attending: Cardiology | Admitting: Cardiology

## 2012-09-07 ENCOUNTER — Encounter (HOSPITAL_COMMUNITY)
Admission: RE | Admit: 2012-09-07 | Discharge: 2012-09-07 | Disposition: A | Discharge status: Home / Self Care | Payer: Medicare Other | Source: Ambulatory Visit | Attending: Cardiology | Admitting: Cardiology

## 2012-09-09 ENCOUNTER — Encounter (HOSPITAL_COMMUNITY)
Admission: RE | Admit: 2012-09-09 | Discharge: 2012-09-09 | Disposition: A | Discharge status: Home / Self Care | Payer: Medicare Other | Source: Ambulatory Visit | Attending: Cardiology | Admitting: Cardiology

## 2012-09-09 DIAGNOSIS — I10 Essential (primary) hypertension: Secondary | ICD-10-CM | POA: Insufficient documentation

## 2012-09-09 DIAGNOSIS — I359 Nonrheumatic aortic valve disorder, unspecified: Secondary | ICD-10-CM | POA: Insufficient documentation

## 2012-09-09 DIAGNOSIS — Z5189 Encounter for other specified aftercare: Secondary | ICD-10-CM | POA: Diagnosis not present

## 2012-09-11 ENCOUNTER — Encounter (HOSPITAL_COMMUNITY)
Admission: RE | Admit: 2012-09-11 | Discharge: 2012-09-11 | Disposition: A | Discharge status: Home / Self Care | Payer: Medicare Other | Source: Ambulatory Visit | Attending: Cardiology | Admitting: Cardiology

## 2012-09-14 ENCOUNTER — Encounter (HOSPITAL_COMMUNITY): Payer: Medicare Other

## 2012-09-16 ENCOUNTER — Encounter (HOSPITAL_COMMUNITY)
Admission: RE | Admit: 2012-09-16 | Discharge: 2012-09-16 | Disposition: A | Discharge status: Home / Self Care | Payer: Medicare Other | Source: Ambulatory Visit | Attending: Cardiology | Admitting: Cardiology

## 2012-09-18 ENCOUNTER — Encounter (HOSPITAL_COMMUNITY)
Admission: RE | Admit: 2012-09-18 | Discharge: 2012-09-18 | Disposition: A | Discharge status: Home / Self Care | Payer: Medicare Other | Source: Ambulatory Visit | Attending: Cardiology | Admitting: Cardiology

## 2012-09-21 ENCOUNTER — Encounter (HOSPITAL_COMMUNITY): Payer: Medicare Other

## 2012-09-23 ENCOUNTER — Encounter (HOSPITAL_COMMUNITY)
Admission: RE | Admit: 2012-09-23 | Discharge: 2012-09-23 | Disposition: A | Discharge status: Home / Self Care | Payer: Medicare Other | Source: Ambulatory Visit | Attending: Cardiology | Admitting: Cardiology

## 2012-09-24 ENCOUNTER — Other Ambulatory Visit: Payer: Self-pay | Admitting: Family Medicine

## 2012-09-24 DIAGNOSIS — R109 Unspecified abdominal pain: Secondary | ICD-10-CM

## 2012-09-24 DIAGNOSIS — I10 Essential (primary) hypertension: Secondary | ICD-10-CM | POA: Diagnosis not present

## 2012-09-24 DIAGNOSIS — Z1273 Encounter for screening for malignant neoplasm of ovary: Secondary | ICD-10-CM | POA: Diagnosis not present

## 2012-09-24 DIAGNOSIS — E785 Hyperlipidemia, unspecified: Secondary | ICD-10-CM | POA: Diagnosis not present

## 2012-09-24 DIAGNOSIS — E119 Type 2 diabetes mellitus without complications: Secondary | ICD-10-CM | POA: Diagnosis not present

## 2012-09-24 DIAGNOSIS — R110 Nausea: Secondary | ICD-10-CM

## 2012-09-24 DIAGNOSIS — Z954 Presence of other heart-valve replacement: Secondary | ICD-10-CM | POA: Diagnosis not present

## 2012-09-24 DIAGNOSIS — Z1159 Encounter for screening for other viral diseases: Secondary | ICD-10-CM | POA: Diagnosis not present

## 2012-09-25 ENCOUNTER — Encounter (HOSPITAL_COMMUNITY)
Admission: RE | Admit: 2012-09-25 | Discharge: 2012-09-25 | Disposition: A | Discharge status: Home / Self Care | Payer: Medicare Other | Source: Ambulatory Visit | Attending: Cardiology | Admitting: Cardiology

## 2012-09-28 ENCOUNTER — Encounter (HOSPITAL_COMMUNITY): Payer: Medicare Other

## 2012-09-28 DIAGNOSIS — I359 Nonrheumatic aortic valve disorder, unspecified: Secondary | ICD-10-CM | POA: Diagnosis not present

## 2012-09-28 DIAGNOSIS — I10 Essential (primary) hypertension: Secondary | ICD-10-CM | POA: Diagnosis not present

## 2012-09-28 DIAGNOSIS — E785 Hyperlipidemia, unspecified: Secondary | ICD-10-CM | POA: Diagnosis not present

## 2012-09-30 ENCOUNTER — Encounter (HOSPITAL_COMMUNITY): Payer: Medicare Other

## 2012-10-01 ENCOUNTER — Inpatient Hospital Stay: Admission: RE | Admit: 2012-10-01 | Payer: Medicare Other | Source: Ambulatory Visit

## 2012-10-02 ENCOUNTER — Telehealth (HOSPITAL_COMMUNITY): Payer: Self-pay | Admitting: Cardiac Rehabilitation

## 2012-10-02 ENCOUNTER — Encounter (HOSPITAL_COMMUNITY)
Admission: RE | Admit: 2012-10-02 | Discharge: 2012-10-02 | Disposition: A | Discharge status: Home / Self Care | Payer: Medicare Other | Source: Ambulatory Visit | Attending: Cardiology | Admitting: Cardiology

## 2012-10-05 ENCOUNTER — Encounter (HOSPITAL_COMMUNITY): Payer: Medicare Other

## 2012-10-07 ENCOUNTER — Encounter (HOSPITAL_COMMUNITY)
Admission: RE | Admit: 2012-10-07 | Discharge: 2012-10-07 | Disposition: A | Discharge status: Home / Self Care | Payer: Medicare Other | Source: Ambulatory Visit | Attending: Cardiology | Admitting: Cardiology

## 2012-10-07 NOTE — Progress Notes (Signed)
Bailey [name] 66 y.o. female Nutrition Note Spoke with pt. Pt returned MEDFICTS survey. There are some ways pt can make her eating habits healthier. Pt is now eating "more of a vegetarian style diet after watching '[place]'." Pt continues to eat 4 eggs/week and uses regular feta cheese and butter. Ways to make diet healthier discussed. Pt has increased her fruit and vegetable intake to 6.5 servings/day. Pt expressed understanding of information reviewed. Pt is unable to attend Tuesday Nutrition Classes. Nutrition Diagnosis   Food-and nutrition-related knowledge deficit related to lack of exposure to information as related to diagnosis of: ? CVD ? DM    Obesity related to excessive energy intake as evidenced by a BMI of 39.3  Nutrition RX/ Estimated Daily Nutrition Needs for: wt loss  1200-1700 Kcal, 30-45 gm fat, 8-13 gm sat fat, 1.2-1.7 gm trans-fat, <1500 mg sodium , 150-175 gm CHO  Nutrition Intervention   Pt's individual nutrition plan reviewed.   Benefits of adopting Therapeutic Lifestyle Changes discussed when Medficts reviewed.    Pt to attend the Portion Distortion class - met 09/02/12   Handouts given for  ? Nutrition I class                                   ? Nutrition II class                                     ? Diabetes Blitz class   Continue client-centered nutrition education by RD, as part of interdisciplinary care. Goal(s)   Pt to identify food quantities necessary to achieve: ? wt loss to a goal wt of 190-202 lb (86.6-92.0 kg) at graduation from cardiac rehab.    Pt to describe the benefit of including fruits, vegetables, whole grains, and low-fat dairy products in a heart healthy meal plan. Monitor and Evaluate progress toward nutrition goal with team.

## 2012-10-09 ENCOUNTER — Encounter (HOSPITAL_COMMUNITY): Payer: Medicare Other

## 2012-10-12 ENCOUNTER — Encounter (HOSPITAL_COMMUNITY): Payer: Medicare Other

## 2012-10-14 ENCOUNTER — Encounter (HOSPITAL_COMMUNITY): Payer: Medicare Other

## 2012-10-16 ENCOUNTER — Encounter (HOSPITAL_COMMUNITY): Payer: Medicare Other

## 2012-10-19 ENCOUNTER — Encounter (HOSPITAL_COMMUNITY): Payer: Medicare Other

## 2012-10-21 ENCOUNTER — Encounter (HOSPITAL_COMMUNITY): Payer: Medicare Other

## 2012-10-21 ENCOUNTER — Telehealth (HOSPITAL_COMMUNITY): Payer: Self-pay | Admitting: Cardiac Rehabilitation

## 2012-10-21 NOTE — Telephone Encounter (Signed)
Lm with pt to assess reason for absence this week.  Pt was absent week of 11/4-11/8 for scheduled work place training.

## 2012-10-21 NOTE — Telephone Encounter (Signed)
Message received from pt stating reason for absence today is viral sickness.  She plans to return as soon as she is able.

## 2012-10-22 ENCOUNTER — Inpatient Hospital Stay: Admission: RE | Admit: 2012-10-22 | Payer: Medicare Other | Source: Ambulatory Visit

## 2012-10-23 ENCOUNTER — Encounter (HOSPITAL_COMMUNITY): Payer: Medicare Other

## 2012-10-26 ENCOUNTER — Encounter (HOSPITAL_COMMUNITY): Payer: Medicare Other

## 2012-10-26 ENCOUNTER — Telehealth (HOSPITAL_COMMUNITY): Payer: Self-pay | Admitting: Cardiac Rehabilitation

## 2012-10-28 ENCOUNTER — Encounter (HOSPITAL_COMMUNITY): Payer: Medicare Other

## 2012-10-30 ENCOUNTER — Encounter (HOSPITAL_COMMUNITY): Payer: Medicare Other

## 2012-11-02 ENCOUNTER — Encounter (HOSPITAL_COMMUNITY)
Admission: RE | Admit: 2012-11-02 | Discharge: 2012-11-02 | Disposition: A | Discharge status: Home / Self Care | Payer: Medicare Other | Source: Ambulatory Visit | Attending: Cardiology | Admitting: Cardiology

## 2012-11-02 DIAGNOSIS — I10 Essential (primary) hypertension: Secondary | ICD-10-CM | POA: Diagnosis not present

## 2012-11-02 DIAGNOSIS — I359 Nonrheumatic aortic valve disorder, unspecified: Secondary | ICD-10-CM | POA: Insufficient documentation

## 2012-11-02 DIAGNOSIS — Z5189 Encounter for other specified aftercare: Secondary | ICD-10-CM | POA: Insufficient documentation

## 2012-11-02 NOTE — Progress Notes (Signed)
Pt returned today after extended absence for sinus infection/allergy.  Pt reports she is currently taking azithromycin with some relief of symptoms, afebrile.    Pt c/o dyspnea more than usual.  Lungs clear with rare scattered wheeze right middle, lower lobes.  Pt tolerated exercise without difficulty.

## 2012-11-04 ENCOUNTER — Encounter (HOSPITAL_COMMUNITY)
Admission: RE | Admit: 2012-11-04 | Discharge: 2012-11-04 | Disposition: A | Discharge status: Home / Self Care | Payer: Medicare Other | Source: Ambulatory Visit | Attending: Cardiology | Admitting: Cardiology

## 2012-11-04 NOTE — Progress Notes (Signed)
Pt requests appointment to meet with Bailey [name] to discuss concerns she has felt since her surgery.  Feelings of sadness and uncertainty, loneliness. Offered to make appt for pt, she declined.  Phone number to spiritual care department given to pt.

## 2012-11-09 ENCOUNTER — Encounter (HOSPITAL_COMMUNITY)
Admission: RE | Admit: 2012-11-09 | Discharge: 2012-11-09 | Disposition: A | Discharge status: Home / Self Care | Payer: Medicare Other | Source: Ambulatory Visit | Attending: Cardiology | Admitting: Cardiology

## 2012-11-09 DIAGNOSIS — I359 Nonrheumatic aortic valve disorder, unspecified: Secondary | ICD-10-CM | POA: Insufficient documentation

## 2012-11-09 DIAGNOSIS — Z5189 Encounter for other specified aftercare: Secondary | ICD-10-CM | POA: Diagnosis not present

## 2012-11-09 DIAGNOSIS — I10 Essential (primary) hypertension: Secondary | ICD-10-CM | POA: Insufficient documentation

## 2012-11-11 ENCOUNTER — Encounter (HOSPITAL_COMMUNITY)
Admission: RE | Admit: 2012-11-11 | Discharge: 2012-11-11 | Disposition: A | Discharge status: Home / Self Care | Payer: Medicare Other | Source: Ambulatory Visit | Attending: Cardiology | Admitting: Cardiology

## 2012-11-11 DIAGNOSIS — H6120 Impacted cerumen, unspecified ear: Secondary | ICD-10-CM | POA: Diagnosis not present

## 2012-11-11 DIAGNOSIS — J398 Other specified diseases of upper respiratory tract: Secondary | ICD-10-CM | POA: Diagnosis not present

## 2012-11-11 NOTE — Progress Notes (Signed)
Pt in today for exercise.  Pt with congested sounding non productive cough.  Pt with continued complaints of sinus pressure and the inability to hear out of her left ear.  Pt did go to urgent care and was treated with antibiotic.  Pt did not take steroid.  Pt with elevated hr over 100 and complains of dizziness when changing positions.  Pt advised not to exercise today.  Appointment made for pt to see Dr. Carlynn Purl at [facility] this afternoon at 2:45. Pt advised to call rehab with update on tomorrow and will determine if pt may return for her last day of participation.  Pt verbalized understanding.

## 2012-11-13 ENCOUNTER — Encounter (HOSPITAL_COMMUNITY): Payer: Medicare Other

## 2012-11-13 ENCOUNTER — Telehealth (HOSPITAL_COMMUNITY): Payer: Self-pay | Admitting: Cardiac Rehabilitation

## 2012-11-13 NOTE — Telephone Encounter (Signed)
Returned call to pt from message left she would be absent from cardiac rehab today for continued bronchitis symptoms.  Left message for pt to return call.  Advised pt would mail her graduation packet and maintenance information.

## 2012-11-24 DIAGNOSIS — H6520 Chronic serous otitis media, unspecified ear: Secondary | ICD-10-CM | POA: Diagnosis not present

## 2012-11-24 DIAGNOSIS — H903 Sensorineural hearing loss, bilateral: Secondary | ICD-10-CM | POA: Diagnosis not present

## 2012-12-23 NOTE — Progress Notes (Addendum)
Pt completed cardiac rehab program attending 31/36 exercise sessions and 6/13 education classes.  Pt had sporadic attendance for health issues and work related conflicts.  Pt made good progress with exercise ability and decreased overall health related anxiety. Pt continues to express significant family related stressors for which she has positive coping skills.    Pt will need MD encouragement to continue positive lifestyle changes.  VSS, telemetry-NSR.  Pt plans to exercise on her own but has also expressed interest in cardiac maintenance program.  Pt will notify us if she is indeed interested in coming to maintenance.  Thank you for the referral

## 2013-12-31 ENCOUNTER — Encounter: Payer: Self-pay | Admitting: Gastroenterology

## 2014-01-06 DIAGNOSIS — Z0000 Encounter for general adult medical examination without abnormal findings: Secondary | ICD-10-CM | POA: Diagnosis not present

## 2014-01-06 DIAGNOSIS — Z1273 Encounter for screening for malignant neoplasm of ovary: Secondary | ICD-10-CM | POA: Diagnosis not present

## 2014-01-06 DIAGNOSIS — E785 Hyperlipidemia, unspecified: Secondary | ICD-10-CM | POA: Diagnosis not present

## 2014-01-06 DIAGNOSIS — E119 Type 2 diabetes mellitus without complications: Secondary | ICD-10-CM | POA: Diagnosis not present

## 2014-01-06 DIAGNOSIS — Z9181 History of falling: Secondary | ICD-10-CM | POA: Diagnosis not present

## 2014-01-06 DIAGNOSIS — Z23 Encounter for immunization: Secondary | ICD-10-CM | POA: Diagnosis not present

## 2014-01-06 DIAGNOSIS — I10 Essential (primary) hypertension: Secondary | ICD-10-CM | POA: Diagnosis not present

## 2014-01-06 DIAGNOSIS — Z1331 Encounter for screening for depression: Secondary | ICD-10-CM | POA: Diagnosis not present

## 2014-01-06 DIAGNOSIS — Z124 Encounter for screening for malignant neoplasm of cervix: Secondary | ICD-10-CM | POA: Diagnosis not present

## 2014-01-06 LAB — HM PAP SMEAR: HM PAP: NORMAL

## 2014-01-07 ENCOUNTER — Encounter: Payer: Self-pay | Admitting: Gastroenterology

## 2014-02-10 ENCOUNTER — Institutional Professional Consult (permissible substitution): Payer: Medicare Other | Admitting: Pulmonary Disease

## 2014-02-17 ENCOUNTER — Ambulatory Visit (AMBULATORY_SURGERY_CENTER): Payer: Self-pay | Admitting: *Deleted

## 2014-02-17 ENCOUNTER — Telehealth: Payer: Self-pay | Admitting: Gastroenterology

## 2014-02-17 VITALS — Ht 62.0 in | Wt 217.8 lb

## 2014-02-17 DIAGNOSIS — Z86010 Personal history of colonic polyps: Secondary | ICD-10-CM

## 2014-02-17 MED ORDER — MOVIPREP 100 G PO SOLR: ORAL | Status: DC

## 2014-02-17 NOTE — Progress Notes (Signed)
No allergies to eggs or soy. No problems with anesthesia.  

## 2014-02-17 NOTE — Telephone Encounter (Signed)
Talked with pharmacist.  Pt will be given voucher for free prep.  Numbers were read to pharmacist over phone

## 2014-02-28 ENCOUNTER — Encounter: Payer: Medicare Other | Admitting: Gastroenterology

## 2014-03-03 ENCOUNTER — Other Ambulatory Visit: Payer: Medicare Other

## 2014-03-03 ENCOUNTER — Encounter: Payer: Self-pay | Admitting: Pulmonary Disease

## 2014-03-03 ENCOUNTER — Ambulatory Visit (INDEPENDENT_AMBULATORY_CARE_PROVIDER_SITE_OTHER): Payer: Medicare Other | Admitting: Pulmonary Disease

## 2014-03-03 VITALS — BP 118/70 | HR 92 | Temp 98.0°F | Ht 62.0 in | Wt 214.0 lb

## 2014-03-03 DIAGNOSIS — J45901 Unspecified asthma with (acute) exacerbation: Secondary | ICD-10-CM | POA: Insufficient documentation

## 2014-03-03 DIAGNOSIS — I10 Essential (primary) hypertension: Secondary | ICD-10-CM | POA: Diagnosis not present

## 2014-03-03 MED ORDER — AZITHROMYCIN 250 MG PO TABS: ORAL_TABLET | ORAL | Status: DC

## 2014-03-03 NOTE — Assessment & Plan Note (Addendum)
Seems to be at least mild to moderate persistent CXR & Allergy blood test (RAST ) today Zpak Use albuterol MDI (rescue ) as needed Use Dulera - twice daily -rinse mouth after use  Stop inhalers morning of breathing test next visit

## 2014-03-03 NOTE — Progress Notes (Signed)
Subjective:    Patient ID: Bailey [name], female    DOB: 1946-09-24, 68 y.o.   MRN: [id number]  HPI  68 year old never smoker presents for evaluation of asthma. She also reports cough productive of green sputum preceded by URI symptoms.  She moved from [place] to [place] to be closer to her daughter a few years ago. Asthma was diagnosed in her 19s and she has used albuterol on a when necessary basis. She reports onset after taking naproxen and an emergency room visit for bronchospasm. Since then she has avoided NSAIDs, but does take an aspirin daily. Is no history of nasal polyps or skin rash. She reports seasonal allergies after moving to [place]. She denies nocturnal symptoms. She was placed on dulera which helped a little bit. Her diabetes is well controlled on metformin. Medication review shows enalapril. She reports constant throat clearing and a dry cough. 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  Chief Complaint  Patient presents with  . Pulmonary Consult    Referred by Dr. Ancil Boozer for asthma.     Past Medical History  Diagnosis Date  . Arthritis   . Asthma   . Diabetes mellitus     mellitus?  . Elevated cholesterol   . HTN (hypertension)   . Kidney stones   . Obesity   . Pneumonia   . Sleep apnea   . Colon polyps 2008   Past Surgical History  Procedure Laterality Date  . Appenedectomy  1958  . Cholecystectomy  1978  . Aortic valve replacement  06/2012    Allergies  Allergen Reactions  . Cyanocobalamin Other (See Comments)    SL tablets. Takes IM shots. Issues because of aortic valve; passes out; "heart stops"  . Dilaudid [Hydromorphone Hcl] Other (See Comments)    Severe hypotension  . Naproxen Shortness Of Breath  . Percocet [Oxycodone-Acetaminophen] Shortness Of Breath  . Latex Rash    welts  . Nsaids     Causes asthma    History   Social History  . Marital Status:  Single    Spouse Name: N/A    Number of Children: N/A  . Years of Education: N/A   Occupational History  . Not on file.   Social History Main Topics  . Smoking status: Never Smoker   . Smokeless tobacco: Never Used     Comment: does not smoke   . Alcohol Use: Yes     Comment: rare  . Drug Use: No  . Sexual Activity: Not on file   Other Topics Concern  . 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 Drinks 2-3 caffeinated beverages/day      Review of Systems  Constitutional: Negative for fever and unexpected weight change.  HENT: Positive for congestion, nosebleeds, postnasal drip, rhinorrhea and sinus pressure. Negative for dental problem, ear pain, sneezing, sore throat and trouble swallowing.   Eyes: Negative for redness and itching.  Respiratory: Positive for cough and shortness of breath. Negative for chest tightness and wheezing.   Cardiovascular: Positive for palpitations. Negative for leg swelling.  Gastrointestinal: Negative for nausea and vomiting.  Genitourinary: Negative for dysuria.  Musculoskeletal: Negative for joint swelling.  Skin: Negative for rash.  Neurological: Negative for headaches.  Hematological: Does not bruise/bleed easily.  Psychiatric/Behavioral: Negative for dysphoric mood. The patient is not nervous/anxious.        Objective:  Physical Exam  Gen. Pleasant, obese, in no distress, normal affect ENT - no lesions, no post nasal drip, class 2-3 airway Neck: No JVD, no thyromegaly, no carotid bruits Lungs: no use of accessory muscles, no dullness to percussion, decreased without rales or rhonchi  Cardiovascular: Rhythm regular, heart sounds  normal, no murmurs or gallops, no peripheral edema Abdomen: soft and non-tender, no hepatosplenomegaly, BS normal. Musculoskeletal: No deformities, no cyanosis or clubbing Neuro:  alert, non focal, no tremors        Assessment & Plan:

## 2014-03-03 NOTE — Patient Instructions (Signed)
CXR & Allergy blood test (RAST ) today Zpak Use albuterol MDI (rescue ) as needed Use Dulera - twice daily -rinse mouth after use  Stop inhalers morning of breathing test next visit STOP enalapril - this may be causing throat clearing Check your BP twice a week until next visit & keep record

## 2014-03-04 LAB — ALLERGY FULL PROFILE
ALLERGEN, D PTERNOYSSINUS, D1: 0.88 kU/L — AB
Allergen,Goose feathers, e70: <0.1 kU/L
Alternaria Alternata: <0.1 kU/L
Aspergillus fumigatus, m3: <0.1 kU/L
Bermuda Grass: <0.1 kU/L
Candida Albicans: <0.1 kU/L
Cat Dander: 0.67 kU/L — ABNORMAL HIGH
Curvularia lunata: <0.1 kU/L
D. FARINAE: 0.98 kU/L — AB
Dog Dander: 0.15 kU/L — ABNORMAL HIGH
Fescue: 0.1 kU/L — ABNORMAL HIGH
G009 RED TOP: 0.11 kU/L — AB
Goldenrod: <0.1 kU/L
HOUSE DUST HOLLISTER: 0.42 kU/L — AB
Helminthosporium halodes: <0.1 kU/L
IGE (IMMUNOGLOBULIN E), SERUM: 7 [IU]/mL (ref 0.0–180.0)
Lamb's Quarters: <0.1 kU/L
Plantain: <0.1 kU/L
Sycamore Tree: <0.1 kU/L

## 2014-03-04 NOTE — Assessment & Plan Note (Signed)
STOP enalapril - this may be causing constant throat clearing Check your BP twice a week until next visit & keep record. His blood pressure is high may have to start angiotensin receptor blocker instead

## 2014-03-21 ENCOUNTER — Ambulatory Visit: Payer: Medicare Other | Admitting: Adult Health

## 2014-03-21 ENCOUNTER — Inpatient Hospital Stay (HOSPITAL_COMMUNITY): Admission: RE | Admit: 2014-03-21 | Payer: Medicare Other | Source: Ambulatory Visit

## 2014-03-31 ENCOUNTER — Emergency Department (HOSPITAL_COMMUNITY): Payer: Medicare Other

## 2014-03-31 ENCOUNTER — Encounter (HOSPITAL_COMMUNITY): Payer: Self-pay | Admitting: Emergency Medicine

## 2014-03-31 ENCOUNTER — Encounter (HOSPITAL_COMMUNITY)
Admission: EM | Disposition: A | Discharge status: Home / Self Care | Payer: Self-pay | Source: Home / Self Care | Attending: Internal Medicine

## 2014-03-31 ENCOUNTER — Inpatient Hospital Stay (HOSPITAL_COMMUNITY)
Admission: EM | Admit: 2014-03-31 | Discharge: 2014-04-01 | DRG: 694 | Disposition: A | Discharge status: Home / Self Care | Payer: Medicare Other | Attending: Internal Medicine | Admitting: Internal Medicine

## 2014-03-31 ENCOUNTER — Encounter (HOSPITAL_COMMUNITY): Payer: Medicare Other | Admitting: Anesthesiology

## 2014-03-31 ENCOUNTER — Emergency Department (HOSPITAL_COMMUNITY): Payer: Medicare Other | Admitting: Anesthesiology

## 2014-03-31 DIAGNOSIS — E119 Type 2 diabetes mellitus without complications: Secondary | ICD-10-CM | POA: Diagnosis not present

## 2014-03-31 DIAGNOSIS — J45909 Unspecified asthma, uncomplicated: Secondary | ICD-10-CM | POA: Diagnosis present

## 2014-03-31 DIAGNOSIS — I10 Essential (primary) hypertension: Secondary | ICD-10-CM | POA: Diagnosis not present

## 2014-03-31 DIAGNOSIS — N1330 Unspecified hydronephrosis: Secondary | ICD-10-CM | POA: Diagnosis present

## 2014-03-31 DIAGNOSIS — R935 Abnormal findings on diagnostic imaging of other abdominal regions, including retroperitoneum: Secondary | ICD-10-CM | POA: Diagnosis not present

## 2014-03-31 DIAGNOSIS — E669 Obesity, unspecified: Secondary | ICD-10-CM | POA: Diagnosis present

## 2014-03-31 DIAGNOSIS — K2970 Gastritis, unspecified, without bleeding: Secondary | ICD-10-CM | POA: Diagnosis not present

## 2014-03-31 DIAGNOSIS — Z7982 Long term (current) use of aspirin: Secondary | ICD-10-CM | POA: Diagnosis not present

## 2014-03-31 DIAGNOSIS — Z954 Presence of other heart-valve replacement: Secondary | ICD-10-CM

## 2014-03-31 DIAGNOSIS — N23 Unspecified renal colic: Secondary | ICD-10-CM

## 2014-03-31 DIAGNOSIS — E7800 Pure hypercholesterolemia, unspecified: Secondary | ICD-10-CM | POA: Diagnosis present

## 2014-03-31 DIAGNOSIS — N139 Obstructive and reflux uropathy, unspecified: Secondary | ICD-10-CM | POA: Diagnosis present

## 2014-03-31 DIAGNOSIS — Z6841 Body Mass Index (BMI) 40.0 and over, adult: Secondary | ICD-10-CM | POA: Diagnosis not present

## 2014-03-31 DIAGNOSIS — G4730 Sleep apnea, unspecified: Secondary | ICD-10-CM | POA: Diagnosis present

## 2014-03-31 DIAGNOSIS — Z87442 Personal history of urinary calculi: Secondary | ICD-10-CM

## 2014-03-31 DIAGNOSIS — R110 Nausea: Secondary | ICD-10-CM | POA: Diagnosis not present

## 2014-03-31 DIAGNOSIS — N201 Calculus of ureter: Secondary | ICD-10-CM | POA: Diagnosis not present

## 2014-03-31 DIAGNOSIS — Z9861 Coronary angioplasty status: Secondary | ICD-10-CM | POA: Diagnosis not present

## 2014-03-31 DIAGNOSIS — N200 Calculus of kidney: Principal | ICD-10-CM | POA: Diagnosis present

## 2014-03-31 DIAGNOSIS — R109 Unspecified abdominal pain: Secondary | ICD-10-CM | POA: Diagnosis not present

## 2014-03-31 DIAGNOSIS — Z79899 Other long term (current) drug therapy: Secondary | ICD-10-CM | POA: Diagnosis not present

## 2014-03-31 DIAGNOSIS — N138 Other obstructive and reflux uropathy: Secondary | ICD-10-CM | POA: Diagnosis present

## 2014-03-31 DIAGNOSIS — K2990 Gastroduodenitis, unspecified, without bleeding: Secondary | ICD-10-CM | POA: Diagnosis not present

## 2014-03-31 DIAGNOSIS — R9431 Abnormal electrocardiogram [ECG] [EKG]: Secondary | ICD-10-CM | POA: Diagnosis not present

## 2014-03-31 LAB — LIPASE, BLOOD: Lipase: 22 U/L (ref 11–59)

## 2014-03-31 LAB — URINALYSIS, ROUTINE W REFLEX MICROSCOPIC
Bilirubin Urine: NEGATIVE
GLUCOSE, UA: NEGATIVE mg/dL
KETONES UR: NEGATIVE mg/dL
LEUKOCYTES UA: NEGATIVE
Nitrite: NEGATIVE
PH: 5 (ref 5.0–8.0)
Protein, ur: 30 mg/dL — AB
Specific Gravity, Urine: 1.026 (ref 1.005–1.030)
Urobilinogen, UA: 0.2 mg/dL (ref 0.0–1.0)

## 2014-03-31 LAB — CBC WITH DIFFERENTIAL/PLATELET
BASOS PCT: 0 % (ref 0–1)
Basophils Absolute: 0 10*3/uL (ref 0.0–0.1)
Eosinophils Absolute: 0.1 10*3/uL (ref 0.0–0.7)
Eosinophils Relative: 1 % (ref 0–5)
HEMATOCRIT: 38.9 % (ref 36.0–46.0)
HEMOGLOBIN: 12.6 g/dL (ref 12.0–15.0)
LYMPHS PCT: 9 % — AB (ref 12–46)
Lymphs Abs: 1 10*3/uL (ref 0.7–4.0)
MCH: 28.2 pg (ref 26.0–34.0)
MCHC: 32.4 g/dL (ref 30.0–36.0)
MCV: 87 fL (ref 78.0–100.0)
MONO ABS: 0.6 10*3/uL (ref 0.1–1.0)
MONOS PCT: 5 % (ref 3–12)
NEUTROS ABS: 9.8 10*3/uL — AB (ref 1.7–7.7)
Neutrophils Relative %: 86 % — ABNORMAL HIGH (ref 43–77)
Platelets: 168 10*3/uL (ref 150–400)
RBC: 4.47 MIL/uL (ref 3.87–5.11)
RDW: 14.5 % (ref 11.5–15.5)
WBC: 11.4 10*3/uL — ABNORMAL HIGH (ref 4.0–10.5)

## 2014-03-31 LAB — URINE MICROSCOPIC-ADD ON

## 2014-03-31 LAB — COMPREHENSIVE METABOLIC PANEL
ALT: 20 U/L (ref 0–35)
AST: 19 U/L (ref 0–37)
Albumin: 3.6 g/dL (ref 3.5–5.2)
Alkaline Phosphatase: 39 U/L (ref 39–117)
BILIRUBIN TOTAL: 0.3 mg/dL (ref 0.3–1.2)
BUN: 18 mg/dL (ref 6–23)
CHLORIDE: 102 meq/L (ref 96–112)
CO2: 24 meq/L (ref 19–32)
CREATININE: 0.74 mg/dL (ref 0.50–1.10)
Calcium: 9.2 mg/dL (ref 8.4–10.5)
GFR calc Af Amer: >90 mL/min (ref >90)
GFR, EST NON AFRICAN AMERICAN: 86 mL/min — AB (ref >90)
Glucose, Bld: 140 mg/dL — ABNORMAL HIGH (ref 70–99)
Potassium: 4.3 mEq/L (ref 3.7–5.3)
Sodium: 140 mEq/L (ref 137–147)
Total Protein: 7.3 g/dL (ref 6.0–8.3)

## 2014-03-31 SURGERY — CYSTOSCOPY, WITH RETROGRADE PYELOGRAM AND URETERAL STENT INSERTION
Anesthesia: General | Laterality: Right

## 2014-03-31 MED ORDER — ONDANSETRON HCL 4 MG/2ML IJ SOLN
4.0000 mg | Freq: Four times a day (QID) | INTRAMUSCULAR | Status: DC | PRN
  Administered 2014-04-01: 4 mg via INTRAVENOUS
  Filled 2014-03-31: qty 2

## 2014-03-31 MED ORDER — MORPHINE SULFATE 4 MG/ML IJ SOLN
4.0000 mg | Freq: Once | INTRAMUSCULAR | Status: AC
  Administered 2014-03-31: 4 mg via INTRAVENOUS

## 2014-03-31 MED ORDER — DIAZEPAM 5 MG/ML IJ SOLN
5.0000 mg | Freq: Once | INTRAMUSCULAR | Status: AC
  Administered 2014-03-31: 5 mg via INTRAVENOUS
  Filled 2014-03-31: qty 2

## 2014-03-31 MED ORDER — FENTANYL CITRATE 0.05 MG/ML IJ SOLN
INTRAMUSCULAR | Status: AC
  Filled 2014-03-31: qty 2

## 2014-03-31 MED ORDER — FENTANYL CITRATE 0.05 MG/ML IJ SOLN
50.0000 ug | Freq: Once | INTRAMUSCULAR | Status: AC
Start: 2014-03-31 — End: 2014-03-31
  Administered 2014-03-31: 50 ug via INTRAVENOUS
  Filled 2014-03-31: qty 2

## 2014-03-31 MED ORDER — FENTANYL CITRATE 0.05 MG/ML IJ SOLN
100.0000 ug | Freq: Once | INTRAMUSCULAR | Status: AC
  Administered 2014-03-31: 100 ug via INTRAVENOUS
  Filled 2014-03-31: qty 2

## 2014-03-31 MED ORDER — ONDANSETRON HCL 4 MG PO TABS
4.0000 mg | ORAL_TABLET | Freq: Four times a day (QID) | ORAL | Status: DC | PRN
Start: 2014-03-31 — End: 2014-04-01

## 2014-03-31 MED ORDER — SODIUM CHLORIDE 0.9 % IV SOLN: INTRAVENOUS | Status: DC

## 2014-03-31 MED ORDER — HEPARIN SODIUM (PORCINE) 5000 UNIT/ML IJ SOLN
5000.0000 [IU] | Freq: Three times a day (TID) | INTRAMUSCULAR | Status: DC
  Filled 2014-03-31 (×5): qty 1

## 2014-03-31 MED ORDER — FENTANYL CITRATE 0.05 MG/ML IJ SOLN
25.0000 ug | INTRAMUSCULAR | Status: DC | PRN
  Administered 2014-03-31 (×3): 50 ug via INTRAVENOUS
  Filled 2014-03-31 (×4): qty 2

## 2014-03-31 MED ORDER — SODIUM CHLORIDE 0.9 % IV BOLUS (SEPSIS)
1000.0000 mL | Freq: Once | INTRAVENOUS | Status: AC
  Administered 2014-03-31: 1000 mL via INTRAVENOUS

## 2014-03-31 MED ORDER — FENTANYL CITRATE 0.05 MG/ML IJ SOLN
50.0000 ug | Freq: Once | INTRAMUSCULAR | Status: AC
  Administered 2014-03-31: 50 ug via INTRAVENOUS

## 2014-03-31 MED ORDER — PROMETHAZINE HCL 25 MG/ML IJ SOLN
6.2500 mg | INTRAMUSCULAR | Status: DC | PRN
  Filled 2014-03-31: qty 1

## 2014-03-31 MED ORDER — FENTANYL CITRATE 0.05 MG/ML IJ SOLN
12.5000 ug | INTRAMUSCULAR | Status: DC | PRN
  Administered 2014-04-01: 25 ug via INTRAVENOUS
  Filled 2014-03-31: qty 2

## 2014-03-31 NOTE — ED Provider Notes (Addendum)
CSN: [id number]     Arrival date & time 03/31/14  1158 History   First MD Initiated Contact with Patient 03/31/14 1203     Chief Complaint  Patient presents with  . Flank Pain     (Consider location/radiation/quality/duration/timing/severity/associated sxs/prior Treatment) The history is provided by the patient.  Bailey [name] is a 68 y.o. female hx of DM, HTN, HL, kidney stones here with R flank pain. R flank pain started this morning. Initially, it was a dull, crampy ache. She went to work and it became sharp and constant. She states that it was similar to her previous kidney stone 7 years ago. Denies previous urologic surgeries. Has some nausea but no vomiting. Given 150 mcg fentanyl and 4mg  zofran by EMS.    Past Medical History  Diagnosis Date  . Arthritis   . Asthma   . Diabetes mellitus     mellitus?  . Elevated cholesterol   . HTN (hypertension)   . Kidney stones   . Obesity   . Pneumonia   . Sleep apnea   . Colon polyps 2008   Past Surgical History  Procedure Laterality Date  . Appenedectomy  1958  . Cholecystectomy  1978  . Aortic valve replacement  06/2012   Family History  Problem Relation Age of Onset  . Colon cancer Neg Hx   . Asthma Daughter   . Asthma Maternal Grandfather   . Heart disease Sister    History  Substance Use Topics  . Smoking status: Never Smoker   . Smokeless tobacco: Never Used     Comment: does not smoke   . Alcohol Use: Yes     Comment: rare   OB History   Grav Para Term Preterm Abortions TAB SAB Ect Mult Living                 Review of Systems  Gastrointestinal: Positive for nausea.  Genitourinary: Positive for flank pain.  All other systems reviewed and are negative.     Allergies  Cyanocobalamin; Dilaudid; Naproxen; Percocet; Latex; and Nsaids  Home Medications   Prior to Admission medications   Medication Sig Start Date End Date Taking? Authorizing Provider  aspirin 81 MG tablet Take 81 mg by mouth daily.     Historical Provider, MD  Astaxanthin 4 MG CAPS Take by mouth 2 (two) times daily.    Historical Provider, MD  azithromycin (ZITHROMAX) 250 MG tablet Take as directed. 03/03/14   Rigoberto Noel, MD  Cholecalciferol (VITAMIN D) 2000 UNITS tablet Take 2,000 Units by mouth daily.      Historical Provider, MD  Cinnamon 500 MG capsule Take 500 mg by mouth daily.    Historical Provider, MD  Coenzyme Q10 (CO Q-10) 100 MG CAPS Take 1 capsule by mouth daily.     Historical Provider, MD  enalapril (VASOTEC) 5 MG tablet Take 5 mg by mouth daily.      Historical Provider, MD  MAGNESIUM PO Take by mouth daily.    Historical Provider, MD  metFORMIN (GLUCOPHAGE) 500 MG tablet Take 500 mg by mouth daily.      Historical Provider, MD  Misc Natural Products (TART CHERRY ADVANCED) CAPS Take 1 capsule by mouth daily as needed.     Historical Provider, MD  MOVIPREP 100 G SOLR moviprep as directed. No substitutions 02/17/14   Milus Banister, MD  Omega-3 Fatty Acids (FISH OIL) 1000 MG CAPS Take by mouth. UAD     Historical Provider, MD  PROAIR HFA 108 (90 BASE) MCG/ACT inhaler Inhale 2 puffs into the lungs every 4 (four) hours as needed.    Historical Provider, MD  TURMERIC CURCUMIN PO Take by mouth every morning.    Historical Provider, MD   BP 126/74  Pulse 67  Temp(Src) 97.9 F (36.6 C) (Oral)  Resp 16  Ht 5\' 2"  (1.575 m)  Wt 215 lb (97.523 kg)  BMI 39.31 kg/m2  SpO2 98% Physical Exam  Nursing note and vitals reviewed. Constitutional: She is oriented to person, place, and time. She appears well-developed.  Uncomfortable   HENT:  Head: Normocephalic.  Mouth/Throat: Oropharynx is clear and moist.  Eyes: Conjunctivae are normal. Pupils are equal, round, and reactive to light.  Neck: Normal range of motion. Neck supple.  Cardiovascular: Normal rate, regular rhythm and normal heart sounds.   Pulmonary/Chest: Effort normal and breath sounds normal. No respiratory distress. She has no wheezes. She has no rales.   Abdominal: Soft. Bowel sounds are normal.  Mild epigastric tenderness and mild R CVAT. Neg murphy   Musculoskeletal: Normal range of motion. She exhibits no edema and no tenderness.  Neurological: She is alert and oriented to person, place, and time.  Skin: Skin is warm and dry.  Psychiatric: She has a normal mood and affect. Her behavior is normal. Judgment and thought content normal.    ED Course  Procedures (including critical care time) Labs Review Labs Reviewed  CBC WITH DIFFERENTIAL - Abnormal; Notable for the following:    WBC 11.4 (*)    Neutrophils Relative % 86 (*)    Neutro Abs 9.8 (*)    Lymphocytes Relative 9 (*)    All other components within normal limits  COMPREHENSIVE METABOLIC PANEL - Abnormal; Notable for the following:    Glucose, Bld 140 (*)    GFR calc non Af Amer 86 (*)    All other components within normal limits  URINALYSIS, ROUTINE W REFLEX MICROSCOPIC - Abnormal; Notable for the following:    APPearance CLOUDY (*)    Hgb urine dipstick LARGE (*)    Protein, ur 30 (*)    All other components within normal limits  URINE MICROSCOPIC-ADD ON - Abnormal; Notable for the following:    Squamous Epithelial / LPF FEW (*)    Casts GRANULAR CAST (*)    Crystals URIC ACID CRYSTALS (*)    All other components within normal limits  URINE CULTURE  LIPASE, BLOOD    Imaging Review Ct Abdomen Pelvis Wo Contrast  03/31/2014   CLINICAL DATA:  Right flank pain.  EXAM: CT ABDOMEN AND PELVIS WITHOUT CONTRAST  TECHNIQUE: Multidetector CT imaging of the abdomen and pelvis was performed following the standard protocol without intravenous contrast.  COMPARISON:  CT abdomen and pelvis 01/13/2007.  FINDINGS: The lung bases are clear without focal nodule, mass, or airspace disease. Mitral annulus calcifications are present. The heart size is normal. No significant pleural or pericardial effusion is present.  Diffuse fatty infiltration of the liver is evident. Spleen is within  normal limits. Stomach, duodenum and pancreas are within normal limits for age. Patient is status post cholecystectomy. The adrenal glands are normal bilaterally.  An obstructing 14 mm stone is present at the right UPJ with moderate right-sided hydronephrosis and stranding. No additional stones are present in the right kidney. A 4 mm nonobstructing stone is seen posteriorly in the left kidney. The left ureter is within normal limits. The distal right ureter is unremarkable. The urinary bladder is within  normal limits.  The rectosigmoid colon is within normal limits. The remainder of the colon is unremarkable. The appendix is not clearly visualized may be surgically absent. Small bowel is unremarkable.  Bone windows demonstrate degenerative anterolisthesis at L4-5. No focal lytic or blastic lesions are present.  IMPRESSION: 1. Obstructing 14 mm stone at through UVJ with moderate right-sided hydronephrosis. 2. Nonobstructing 4 mm left kidney stone. 3. Hepatic steatosis. 4. Mitral anulus calcifications.   Electronically Signed   By: Lawrence Santiago M.D.   On: 03/31/2014 15:33     EKG Interpretation None      MDM   Final diagnoses:  None    Bailey [name] is a 68 y.o. female here with R flank pain, hematuria. Likely renal colic. Will do CT ab/pel, labs, UA. Will give pain meds and reassess.   5:06 PM Patient in persistent pain despite pain meds. CT showed obstructing 14 mm R UVJ stone with hydro. I called Dr. Janice Norrie, who will see patient. Offered stent placement at [facility] but she wants to talk to him first.    8:41 PM Dr. Janice Norrie saw patient. Refused stent placement. She wants lithotripsy. However, don't know if machine will be here or [facility]aser or [employer]. She is still in pain. Will admit for pain control on medicine. She will need to be transferred for lithotripsy tomorrow.   Wandra Arthurs, MD 03/31/14 [place] Yao, MD 03/31/14 [place] Yao, MD 03/31/14 2042

## 2014-03-31 NOTE — ED Notes (Signed)
Per EMS pt from c/o right sided flank pain 10/10 with hematuria, nausea and dry heaves. Pt has hx of kidney stones- last one this bad was 7 years ago sts she has a frequent hx of smaller stones that pass easily and pain resolves. Pt in NAD  Pt has had 4mg  IV zofran and 135mcg of IV fentanyl. Pt pain now 6/10 in ED pain is increasing again and patient dry heaving.

## 2014-03-31 NOTE — Consult Note (Signed)
Urology Consult  Referring physician: Dr. Shirlyn Goltz Reason for referral: Right UPJ calculus  Chief Complaint: Right flank pain, nausea  History of Present Illness: Patient is a 68 years old female who was seen in the emergency room this morning with sudden onset of right flank pain. The pain was dull in nature to start with. It gradually got worse and then she went to the emergency room. She states that it felt like the kidney stones that she had 7 years ago. She never had any procedure for kidney stone and she passed the stones in the past. CT scan showed a 14 mm stone at the right UPJ with moderate hydronephrosis. I explained to the patient that she would need double-J stent followed by ESL. However she states that she did not want to have a stent. Her brother-in-law had a stent placement done several years ago and had an infection and he did not survive. So she would prefer to proceed with ESL. I indicated to her that there is a risk of obstruction by stone fragments that could require stent placement in the future. She states that she would be willing to take that risk at that time if she needs a stent. She is willing to go home today. And I will attempt to contact the [facility] and see if ESL could be done tomorrow.  Past Medical History  Diagnosis Date  . Arthritis   . Asthma   . Diabetes mellitus     mellitus?  . Elevated cholesterol   . HTN (hypertension)   . Kidney stones   . Obesity   . Pneumonia   . Sleep apnea   . Colon polyps 2008   Past Surgical History  Procedure Laterality Date  . Appenedectomy  1958  . Cholecystectomy  1978  . Aortic valve replacement  06/2012    Medications: Aspirin, Zithromax, vitamin D, coenzyme Q 10, metformin, Vasotec, omega-3 fatty acids Allergies:  Allergies  Allergen Reactions  . Cyanocobalamin Other (See Comments)    SL tablets. Takes IM shots. Issues because of aortic valve; passes out; "heart stops"  . Dilaudid  [Hydromorphone Hcl] Other (See Comments)    Severe hypotension  . Naproxen Shortness Of Breath  . Percocet [Oxycodone-Acetaminophen] Shortness Of Breath  . Latex Rash    welts  . Nsaids     Causes asthma    Family History  Problem Relation Age of Onset  . Colon cancer Neg Hx   . Asthma Daughter   . Asthma Maternal Grandfather   . Heart disease Sister    Social History:  reports that she has never smoked. She has never used smokeless tobacco. She reports that she drinks alcohol. She reports that she does not use illicit drugs.  ROS: All systems are reviewed and negative except as noted.   Physical Exam:  Vital signs in last 24 hours: Temp:  [97.8 F (36.6 C)-98 F (36.7 C)] 98 F (36.7 C) (04/23 2336) Pulse Rate:  [67-84] 77 (04/23 2336) Resp:  [12-33] 20 (04/23 2336) BP: (110-147)/(63-90) 134/75 mmHg (04/23 2336) SpO2:  [90 %-100 %] 100 % (04/23 2336) Weight:  [97.523 kg (215 lb)] 97.523 kg (215 lb) (04/23 1201)  Cardiovascular: Skin warm; not flushed Respiratory: Breaths quiet; no shortness of breath Abdomen: No masses Neurological: Normal sensation to touch Musculoskeletal: Normal motor function arms and legs Lymphatics: No inguinal adenopathy Skin: No rashes   Laboratory Data:  Results for orders placed during the hospital encounter of 03/31/14 (  from the past 72 hour(s))  CBC WITH DIFFERENTIAL     Status: Abnormal   Collection Time    03/31/14 12:15 PM      Result Value Ref Range   WBC 11.4 (*) 4.0 - 10.5 K/uL   RBC 4.47  3.87 - 5.11 MIL/uL   Hemoglobin 12.6  12.0 - 15.0 g/dL   HCT 38.9  36.0 - 46.0 %   MCV 87.0  78.0 - 100.0 fL   MCH 28.2  26.0 - 34.0 pg   MCHC 32.4  30.0 - 36.0 g/dL   RDW 14.5  11.5 - 15.5 %   Platelets 168  150 - 400 K/uL   Neutrophils Relative % 86 (*) 43 - 77 %   Neutro Abs 9.8 (*) 1.7 - 7.7 K/uL   Lymphocytes Relative 9 (*) 12 - 46 %   Lymphs Abs 1.0  0.7 - 4.0 K/uL   Monocytes Relative 5  3 - 12 %   Monocytes Absolute 0.6  0.1  - 1.0 K/uL   Eosinophils Relative 1  0 - 5 %   Eosinophils Absolute 0.1  0.0 - 0.7 K/uL   Basophils Relative 0  0 - 1 %   Basophils Absolute 0.0  0.0 - 0.1 K/uL  COMPREHENSIVE METABOLIC PANEL     Status: Abnormal   Collection Time    03/31/14 12:15 PM      Result Value Ref Range   Sodium 140  137 - 147 mEq/L   Potassium 4.3  3.7 - 5.3 mEq/L   Chloride 102  96 - 112 mEq/L   CO2 24  19 - 32 mEq/L   Glucose, Bld 140 (*) 70 - 99 mg/dL   BUN 18  6 - 23 mg/dL   Creatinine, Ser 0.74  0.50 - 1.10 mg/dL   Calcium 9.2  8.4 - 10.5 mg/dL   Total Protein 7.3  6.0 - 8.3 g/dL   Albumin 3.6  3.5 - 5.2 g/dL   AST 19  0 - 37 U/L   ALT 20  0 - 35 U/L   Alkaline Phosphatase 39  39 - 117 U/L   Total Bilirubin 0.3  0.3 - 1.2 mg/dL   GFR calc non Af Amer 86 (*) >90 mL/min   GFR calc Af Amer >90  >90 mL/min   Comment: (NOTE)     The eGFR has been calculated using the CKD EPI equation.     This calculation has not been validated in all clinical situations.     eGFR's persistently <90 mL/min signify possible Chronic Kidney     Disease.  LIPASE, BLOOD     Status: None   Collection Time    03/31/14 12:15 PM      Result Value Ref Range   Lipase 22  11 - 59 U/L  URINALYSIS, ROUTINE W REFLEX MICROSCOPIC     Status: Abnormal   Collection Time    03/31/14  1:38 PM      Result Value Ref Range   Color, Urine YELLOW  YELLOW   APPearance CLOUDY (*) CLEAR   Specific Gravity, Urine 1.026  1.005 - 1.030   pH 5.0  5.0 - 8.0   Glucose, UA NEGATIVE  NEGATIVE mg/dL   Hgb urine dipstick LARGE (*) NEGATIVE   Bilirubin Urine NEGATIVE  NEGATIVE   Ketones, ur NEGATIVE  NEGATIVE mg/dL   Protein, ur 30 (*) NEGATIVE mg/dL   Urobilinogen, UA 0.2  0.0 - 1.0 mg/dL   Nitrite NEGATIVE  NEGATIVE   Leukocytes, UA NEGATIVE  NEGATIVE  URINE MICROSCOPIC-ADD ON     Status: Abnormal   Collection Time    03/31/14  1:38 PM      Result Value Ref Range   Squamous Epithelial / LPF FEW (*) RARE   WBC, UA 0-2  <3 WBC/hpf   RBC /  HPF TOO NUMEROUS TO COUNT  <3 RBC/hpf   Casts GRANULAR CAST (*) NEGATIVE   Crystals URIC ACID CRYSTALS (*) NEGATIVE   No results found for this or any previous visit (from the past 240 hour(s)). Creatinine:  Recent Labs  03/31/14 1215  CREATININE 0.74    Xrays: See report/chart   Impression/Assessment:  Moderate right hydronephrosis secondary to a 14 mm UPJ calculus  Plan:  Patient would prefer not to have a stent placement. She would like to proceed with ESL. I will try to make arrangement for her to have the procedure done at a different location if possible since we cannot do lithotripsy in [place] until Monday.  Lowella Bandy 03/31/2014, 11:50 PM    CC: Dr. Shirlyn Goltz

## 2014-03-31 NOTE — ED Notes (Signed)
RN mixed morphine in 10 cc flush and pushed over 3 min. Pt sts immediately "Its not working." Shaking arms and legs and hypeventilating. RN instructed patient in deep breathing technique. Pt sts "I feel like I'm going like when I did with that dilaudid. It feels like there is a lot of pressure on my chest." RN placed my patient on 5 lead. R 30, HR 81 BP 144/85

## 2014-03-31 NOTE — ED Notes (Signed)
Dr. Darl Householder made aware of patient pain- unrelieved. Verbal order for 53mcg of IV fentanyl ordered

## 2014-03-31 NOTE — Anesthesia Preprocedure Evaluation (Deleted)
Anesthesia Evaluation  Patient identified by MRN, date of birth, ID band Patient awake    Reviewed: Allergy & Precautions, H&P , NPO status , Patient's Chart, lab work & pertinent test results  Airway Mallampati: II TM Distance: <3 FB Neck ROM: Full    Dental no notable dental hx.    Pulmonary asthma ,  breath sounds clear to auscultation  Pulmonary exam normal       Cardiovascular hypertension, Pt. on medications negative cardio ROS  Rhythm:Regular Rate:Normal     Neuro/Psych negative neurological ROS  negative psych ROS   GI/Hepatic negative GI ROS, Neg liver ROS,   Endo/Other  diabetesMorbid obesity  Renal/GU negative Renal ROS  negative genitourinary   Musculoskeletal negative musculoskeletal ROS (+)   Abdominal   Peds negative pediatric ROS (+)  Hematology negative hematology ROS (+)   Anesthesia Other Findings   Reproductive/Obstetrics negative OB ROS                           Anesthesia Physical Anesthesia Plan  ASA: III  Anesthesia Plan: General   Post-op Pain Management:    Induction: Intravenous  Airway Management Planned: LMA  Additional Equipment:   Intra-op Plan:   Post-operative Plan: Extubation in OR  Informed Consent: I have reviewed the patients History and Physical, chart, labs and discussed the procedure including the risks, benefits and alternatives for the proposed anesthesia with the patient or authorized representative who has indicated his/her understanding and acceptance.   Dental advisory given  Plan Discussed with: CRNA and Surgeon  Anesthesia Plan Comments:         Anesthesia Quick Evaluation

## 2014-03-31 NOTE — ED Notes (Signed)
Valeria- Lab notified for add on. sts will do.

## 2014-03-31 NOTE — ED Notes (Signed)
Ct called again for update- sts 2 out. Patient updated.

## 2014-04-01 ENCOUNTER — Encounter (HOSPITAL_COMMUNITY): Payer: Self-pay | Admitting: *Deleted

## 2014-04-01 DIAGNOSIS — R9431 Abnormal electrocardiogram [ECG] [EKG]: Secondary | ICD-10-CM | POA: Diagnosis not present

## 2014-04-01 DIAGNOSIS — Z87442 Personal history of urinary calculi: Secondary | ICD-10-CM | POA: Diagnosis present

## 2014-04-01 DIAGNOSIS — I10 Essential (primary) hypertension: Secondary | ICD-10-CM | POA: Diagnosis present

## 2014-04-01 DIAGNOSIS — E119 Type 2 diabetes mellitus without complications: Secondary | ICD-10-CM | POA: Diagnosis present

## 2014-04-01 DIAGNOSIS — R109 Unspecified abdominal pain: Secondary | ICD-10-CM | POA: Diagnosis not present

## 2014-04-01 DIAGNOSIS — N200 Calculus of kidney: Secondary | ICD-10-CM | POA: Diagnosis not present

## 2014-04-01 DIAGNOSIS — N139 Obstructive and reflux uropathy, unspecified: Secondary | ICD-10-CM | POA: Diagnosis present

## 2014-04-01 DIAGNOSIS — R935 Abnormal findings on diagnostic imaging of other abdominal regions, including retroperitoneum: Secondary | ICD-10-CM | POA: Diagnosis not present

## 2014-04-01 DIAGNOSIS — N23 Unspecified renal colic: Secondary | ICD-10-CM | POA: Diagnosis not present

## 2014-04-01 LAB — CBC
HEMATOCRIT: 35.7 % — AB (ref 36.0–46.0)
Hemoglobin: 11.7 g/dL — ABNORMAL LOW (ref 12.0–15.0)
MCH: 28 pg (ref 26.0–34.0)
MCHC: 32.8 g/dL (ref 30.0–36.0)
MCV: 85.4 fL (ref 78.0–100.0)
Platelets: 160 10*3/uL (ref 150–400)
RBC: 4.18 MIL/uL (ref 3.87–5.11)
RDW: 14.7 % (ref 11.5–15.5)
WBC: 12.6 10*3/uL — AB (ref 4.0–10.5)

## 2014-04-01 LAB — PROTIME-INR
INR: 1.04 (ref 0.00–1.49)
Prothrombin Time: 13.4 seconds (ref 11.6–15.2)

## 2014-04-01 LAB — COMPREHENSIVE METABOLIC PANEL
ALBUMIN: 3.4 g/dL — AB (ref 3.5–5.2)
ALT: 18 U/L (ref 0–35)
AST: 18 U/L (ref 0–37)
Alkaline Phosphatase: 39 U/L (ref 39–117)
BUN: 17 mg/dL (ref 6–23)
CO2: 21 mEq/L (ref 19–32)
CREATININE: 0.72 mg/dL (ref 0.50–1.10)
Calcium: 8.7 mg/dL (ref 8.4–10.5)
Chloride: 102 mEq/L (ref 96–112)
GFR calc Af Amer: >90 mL/min (ref >90)
GFR calc non Af Amer: 87 mL/min — ABNORMAL LOW (ref >90)
Glucose, Bld: 134 mg/dL — ABNORMAL HIGH (ref 70–99)
POTASSIUM: 4 meq/L (ref 3.7–5.3)
Sodium: 138 mEq/L (ref 137–147)
Total Bilirubin: 0.4 mg/dL (ref 0.3–1.2)
Total Protein: 6.8 g/dL (ref 6.0–8.3)

## 2014-04-01 LAB — GLUCOSE, CAPILLARY
GLUCOSE-CAPILLARY: 117 mg/dL — AB (ref 70–99)
GLUCOSE-CAPILLARY: 138 mg/dL — AB (ref 70–99)

## 2014-04-01 LAB — URIC ACID: URIC ACID, SERUM: 7.5 mg/dL — AB (ref 2.4–7.0)

## 2014-04-01 MED ORDER — INSULIN ASPART 100 UNIT/ML ~~LOC~~ SOLN
0.0000 [IU] | Freq: Every day | SUBCUTANEOUS | Status: DC
Start: 2014-04-01 — End: 2014-04-01

## 2014-04-01 MED ORDER — LEVOFLOXACIN 750 MG PO TABS: 750.0000 mg | ORAL_TABLET | Freq: Every day | ORAL | Status: DC

## 2014-04-01 MED ORDER — FENTANYL CITRATE 0.05 MG/ML IJ SOLN
INTRAMUSCULAR | Status: AC
  Filled 2014-04-01: qty 2

## 2014-04-01 MED ORDER — PANTOPRAZOLE SODIUM 40 MG IV SOLR
40.0000 mg | INTRAVENOUS | Status: DC
  Administered 2014-04-01: 40 mg via INTRAVENOUS
  Filled 2014-04-01 (×2): qty 40

## 2014-04-01 MED ORDER — ALBUTEROL SULFATE (2.5 MG/3ML) 0.083% IN NEBU: 2.5000 mg | INHALATION_SOLUTION | RESPIRATORY_TRACT | Status: DC | PRN

## 2014-04-01 MED ORDER — TAMSULOSIN HCL 0.4 MG PO CAPS: 0.4000 mg | ORAL_CAPSULE | Freq: Every day | ORAL | Status: DC

## 2014-04-01 MED ORDER — TAMSULOSIN HCL 0.4 MG PO CAPS
0.4000 mg | ORAL_CAPSULE | Freq: Every day | ORAL | Status: DC
  Administered 2014-04-01: 0.4 mg via ORAL
  Filled 2014-04-01 (×2): qty 1

## 2014-04-01 MED ORDER — FENTANYL CITRATE 0.05 MG/ML IJ SOLN
50.0000 ug | Freq: Once | INTRAMUSCULAR | Status: AC
  Administered 2014-04-01: 50 ug via INTRAVENOUS

## 2014-04-01 MED ORDER — ASPIRIN 81 MG PO CHEW: 81.0000 mg | CHEWABLE_TABLET | Freq: Every day | ORAL | Status: DC

## 2014-04-01 MED ORDER — METFORMIN HCL 500 MG PO TABS: 500.0000 mg | ORAL_TABLET | Freq: Two times a day (BID) | ORAL | Status: DC

## 2014-04-01 MED ORDER — HEPARIN SODIUM (PORCINE) 5000 UNIT/ML IJ SOLN
5000.0000 [IU] | Freq: Three times a day (TID) | INTRAMUSCULAR | Status: DC
Start: 2014-04-01 — End: 2014-04-01
  Filled 2014-04-01 (×3): qty 1

## 2014-04-01 MED ORDER — LORAZEPAM 0.5 MG PO TABS: 0.5000 mg | ORAL_TABLET | Freq: Four times a day (QID) | ORAL | Status: DC | PRN

## 2014-04-01 MED ORDER — OXYCODONE HCL 5 MG PO TABS
5.0000 mg | ORAL_TABLET | ORAL | Status: DC | PRN
  Administered 2014-04-01: 5 mg via ORAL
  Filled 2014-04-01: qty 1

## 2014-04-01 MED ORDER — FENTANYL CITRATE 0.05 MG/ML IJ SOLN
25.0000 ug | Freq: Once | INTRAMUSCULAR | Status: AC
Start: 2014-04-01 — End: 2014-04-01
  Administered 2014-04-01: 25 ug via INTRAVENOUS

## 2014-04-01 MED ORDER — SODIUM CHLORIDE 0.9 % IV BOLUS (SEPSIS): 500.0000 mL | INTRAVENOUS | Status: DC | PRN

## 2014-04-01 MED ORDER — LEVOFLOXACIN 750 MG PO TABS
750.0000 mg | ORAL_TABLET | Freq: Every day | ORAL | Status: DC
  Filled 2014-04-01: qty 1

## 2014-04-01 MED ORDER — POTASSIUM CITRATE ER 10 MEQ (1080 MG) PO TBCR
10.0000 meq | EXTENDED_RELEASE_TABLET | Freq: Three times a day (TID) | ORAL | Status: DC
  Filled 2014-04-01 (×2): qty 1

## 2014-04-01 MED ORDER — ASPIRIN 81 MG PO CHEW
81.0000 mg | CHEWABLE_TABLET | Freq: Every day | ORAL | Status: DC
  Filled 2014-04-01: qty 1

## 2014-04-01 MED ORDER — OXYCODONE HCL 5 MG PO TABS: 5.0000 mg | ORAL_TABLET | ORAL | Status: DC | PRN

## 2014-04-01 MED ORDER — INSULIN ASPART 100 UNIT/ML ~~LOC~~ SOLN: 0.0000 [IU] | Freq: Three times a day (TID) | SUBCUTANEOUS | Status: DC

## 2014-04-01 MED ORDER — POTASSIUM CITRATE ER 10 MEQ (1080 MG) PO TBCR
10.0000 meq | EXTENDED_RELEASE_TABLET | Freq: Three times a day (TID) | ORAL | Status: DC
  Administered 2014-04-01: 10 meq via ORAL
  Filled 2014-04-01 (×5): qty 1

## 2014-04-01 NOTE — Progress Notes (Signed)
Spoke with Dr. Janice Norrie.  He suggested that the patient could be seen in [place] or [place] today for lithotripsy.  She would need to be discharged in the AM to make this happen.  Jillyn Ledger, MBA, BS, RN

## 2014-04-01 NOTE — Discharge Instructions (Signed)
Follow with Primary MD Christianne Borrow, MD in 3days   Get CBC, CMP, checked 3 days by Primary MD and again as instructed by your Primary MD.   Activity: As tolerated with Full fall precautions use walker/cane & assistance as needed   Disposition Home     Diet: Heart Healthy Low carb  For Heart failure patients - Check your Weight same time everyday, if you gain over 2 pounds, or you develop in leg swelling, experience more shortness of breath or chest pain, call your Primary MD immediately. Follow Cardiac Low Salt Diet and 1.8 lit/day fluid restriction.   On your next visit with her primary care physician please Get Medicines reviewed and adjusted.  Please request your Prim.MD to go over all Hospital Tests and Procedure/Radiological results at the follow up, please get all Hospital records sent to your Prim MD by signing hospital release before you go home.   If you experience worsening of your admission symptoms, develop shortness of breath, life threatening emergency, suicidal or homicidal thoughts you must seek medical attention immediately by calling 911 or calling your MD immediately  if symptoms less severe.  You Must read complete instructions/literature along with all the possible adverse reactions/side effects for all the Medicines you take and that have been prescribed to you. Take any new Medicines after you have completely understood and accpet all the possible adverse reactions/side effects.   Do not drive and provide baby sitting services if your were admitted for syncope or siezures until you have seen by Primary MD or a Neurologist and advised to do so again.  Do not drive when taking Pain medications.    Do not take more than prescribed Pain, Sleep and Anxiety Medications  Special Instructions: If you have smoked or chewed Tobacco  in the last 2 yrs please stop smoking, stop any regular Alcohol  and or any Recreational drug use.  Wear Seat belts while  driving.   Please note  You were cared for by a hospitalist during your hospital stay. If you have any questions about your discharge medications or the care you received while you were in the hospital after you are discharged, you can call the unit and asked to speak with the hospitalist on call if the hospitalist that took care of you is not available. Once you are discharged, your primary care physician will handle any further medical issues. Please note that NO REFILLS for any discharge medications will be authorized once you are discharged, as it is imperative that you return to your primary care physician (or establish a relationship with a primary care physician if you do not have one) for your aftercare needs so that they can reassess your need for medications and monitor your lab values.

## 2014-04-01 NOTE — Discharge Summary (Signed)
Bailey [name], is a 68 y.o. female  DOB Jan 13, 1946  MRN [id number].  Admission date:  03/31/2014  Admitting Physician  Lowella Bandy, MD  Discharge Date:  04/01/2014   Primary MD  Christianne Borrow, MD  Recommendations for primary care physician for things to follow:   Kindly follow CBC, BMP and glycemic control closely   Admission Diagnosis  Renal colic on right side [962.9]   Discharge Diagnosis  Renal colic on right side [528.4]     Principal Problem:   Urinary tract obstruction by kidney stone Active Problems:   History of renal stone   Diabetes   Essential hypertension, benign   Obstructive uropathy      Past Medical History  Diagnosis Date  . Arthritis   . Asthma   . Diabetes mellitus     mellitus?  . Elevated cholesterol   . HTN (hypertension)   . Kidney stones   . Obesity   . Pneumonia   . Sleep apnea   . Colon polyps 2008    Past Surgical History  Procedure Laterality Date  . Appenedectomy  1958  . Cholecystectomy  1978  . Aortic valve replacement  06/2012  . Appendectomy    . Cholecystectomy    . Tonsillectomy    . Cardiac valve replacement       Discharge Condition: stable   Follow UP  Follow-up Information   Follow up with Christianne Borrow, MD. Schedule an appointment as soon as possible for a visit in 3 days.   Specialty:  Family Medicine   Contact information:   [address] [place] [address] [phone]       Follow up with Joie Bimler, MD. (Now)    Specialty:  Urology   Contact information:   [address] [place] [address] [phone]         Discharge Instructions  and  Discharge Medications      Discharge Orders   Future Appointments Provider Department Dept Phone   04/27/2014 8:30 AM Irene Shipper, MD [place] [phone]   Future Orders Complete By Expires   Discharge instructions  As directed    Scheduling Instructions:   Follow with Primary MD Christianne Borrow, MD in 3days   Get CBC, CMP, checked 3 days by Primary MD and again as instructed by your Primary MD.   Activity: As tolerated with Full fall precautions use walker/cane & assistance as needed   Disposition Home     Diet: Heart Healthy Low carb  For Heart failure patients - Check your Weight same time everyday, if you gain over 2 pounds, or you develop in leg swelling, experience more shortness of breath or chest pain, call your Primary MD immediately. Follow Cardiac Low Salt Diet and 1.8 lit/day fluid restriction.   On your next visit with her primary care physician please Get Medicines reviewed and adjusted.  Please request your Prim.MD to go over all Hospital Tests and Procedure/Radiological results  at the follow up, please get all Hospital records sent to your Prim MD by signing hospital release before you go home.   If you experience worsening of your admission symptoms, develop shortness of breath, life threatening emergency, suicidal or homicidal thoughts you must seek medical attention immediately by calling 911 or calling your MD immediately  if symptoms less severe.  You Must read complete instructions/literature along with all the possible adverse reactions/side effects for all the Medicines you take and that have been prescribed to you. Take any new Medicines after you have completely understood and accpet all the possible adverse reactions/side effects.   Do not drive and provide baby sitting services if your were admitted for syncope or siezures until you have seen by Primary MD or a Neurologist and advised to do so again.  Do not drive when taking Pain medications.    Do not take more than prescribed Pain, Sleep and Anxiety Medications  Special Instructions: If you have smoked or  chewed Tobacco  in the last 2 yrs please stop smoking, stop any regular Alcohol  and or any Recreational drug use.  Wear Seat belts while driving.   Please note  You were cared for by a hospitalist during your hospital stay. If you have any questions about your discharge medications or the care you received while you were in the hospital after you are discharged, you can call the unit and asked to speak with the hospitalist on call if the hospitalist that took care of you is not available. Once you are discharged, your primary care physician will handle any further medical issues. Please note that NO REFILLS for any discharge medications will be authorized once you are discharged, as it is imperative that you return to your primary care physician (or establish a relationship with a primary care physician if you do not have one) for your aftercare needs so that they can reassess your need for medications and monitor your lab values.   Increase activity slowly  As directed        Medication List    STOP taking these medications       enalapril 5 MG tablet  Commonly known as:  VASOTEC      TAKE these medications       aspirin 81 MG tablet  Take 81 mg by mouth daily.     Astaxanthin 4 MG Caps  Take by mouth 2 (two) times daily.     Cinnamon 500 MG capsule  Take 500 mg by mouth daily.     Co Q-10 100 MG Caps  Take 1 capsule by mouth daily.     Fish Oil 1000 MG Caps  Take 1 capsule by mouth daily. UAD     levofloxacin 750 MG tablet  Commonly known as:  LEVAQUIN  Take 1 tablet (750 mg total) by mouth daily.     MAGNESIUM PO  Take 2 tablets by mouth 2 (two) times daily.     metFORMIN 500 MG tablet  Commonly known as:  GLUCOPHAGE  Take 1 tablet (500 mg total) by mouth 2 (two) times daily with a meal.  Start taking on:  04/03/2014     oxyCODONE 5 MG immediate release tablet  Commonly known as:  Oxy IR/ROXICODONE  Take 1 tablet (5 mg total) by mouth every 4 (four) hours as  needed for severe pain or breakthrough pain.     PROAIR HFA 108 (90 BASE) MCG/ACT inhaler  Generic drug:  albuterol  Inhale 2 puffs into the lungs every 4 (four) hours as needed for shortness of breath.     tamsulosin 0.4 MG Caps capsule  Commonly known as:  FLOMAX  Take 1 capsule (0.4 mg total) by mouth daily.     TURMERIC CURCUMIN PO  Take 1 tablet by mouth every morning.     Vitamin D 2000 UNITS tablet  Take 2,000 Units by mouth daily.          Diet and Activity recommendation: See Discharge Instructions above   Consults obtained - Urology - [place]   Major procedures and Radiology Reports - PLEASE review detailed and final reports for all details, in brief -       Ct Abdomen Pelvis Wo Contrast  03/31/2014   CLINICAL DATA:  Right flank pain.  EXAM: CT ABDOMEN AND PELVIS WITHOUT CONTRAST  TECHNIQUE: Multidetector CT imaging of the abdomen and pelvis was performed following the standard protocol without intravenous contrast.  COMPARISON:  CT abdomen and pelvis 01/13/2007.  FINDINGS: The lung bases are clear without focal nodule, mass, or airspace disease. Mitral annulus calcifications are present. The heart size is normal. No significant pleural or pericardial effusion is present.  Diffuse fatty infiltration of the liver is evident. Spleen is within normal limits. Stomach, duodenum and pancreas are within normal limits for age. Patient is status post cholecystectomy. The adrenal glands are normal bilaterally.  An obstructing 14 mm stone is present at the right UPJ with moderate right-sided hydronephrosis and stranding. No additional stones are present in the right kidney. A 4 mm nonobstructing stone is seen posteriorly in the left kidney. The left ureter is within normal limits. The distal right ureter is unremarkable. The urinary bladder is within normal limits.  The rectosigmoid colon is within normal limits. The remainder of the colon is unremarkable. The appendix is not clearly  visualized may be surgically absent. Small bowel is unremarkable.  Bone windows demonstrate degenerative anterolisthesis at L4-5. No focal lytic or blastic lesions are present.  IMPRESSION: 1. Obstructing 14 mm stone at through UVJ with moderate right-sided hydronephrosis. 2. Nonobstructing 4 mm left kidney stone. 3. Hepatic steatosis. 4. Mitral anulus calcifications.   Electronically Signed   By: Lawrence Santiago M.D.   On: 03/31/2014 15:33    Micro Results      No results found for this or any previous visit (from the past 240 hour(s)).   History of present illness and  Hospital Course:     Kindly see H&P for history of present illness and admission details, please review complete Labs, Consult reports and Test reports for all details in brief Bailey [name], is a 68 y.o. female, patient with history of  type 2 diabetes mellitus, hypertension, atrial kidney stones, asthma, obstructive sleep apnea and morbid obesity presented to the hospital with chief complaints of right-sided flank pain. CT scan was suggestive of right-sided ureteric obstruction with hydronephrosis due to a kidney stone.   She was placed on Flomax along with IV fluids, pain medications and was started on Levaquin this morning. With supportive care she feels better however she needs lithotripsy for definitive treatment, she was seen by urologist Dr. Leanna Sato this morning who recommended that patient be discharged from this hospital and she should present to the urology office in-[place]-for lithotripsy today as lithotripsy cannot be performed locally until Monday. Patient was seen she appears nontoxic and she is walking in the hospital room without any distress, will give her 500 cc normal  saline bolus as her blood pressure is slightly soft, will place her on 5 more days of oral Levaquin and have her follow with urology and PCP within 3-4 days. We'll request PCP to repeat CBC and BMP in 3-4 days . She has been asked to hold her  ACE till she sees her PCP.   Incidental finding of fatty liver on CT scan of the abdomen will defer to PCP, one time outpatient GI followup can be considered in the outpatient setting.     Today   Subjective:   Karilyn Cota today has no headache,no chest abdominal pain, mild R flank pain,no new weakness tingling or numbness, feels much better wants to go home today.    Objective:   Blood pressure 94/52, pulse 82, temperature 98 F (36.7 C), temperature source Oral, resp. rate 20, height 5\' 2"  (1.575 m), weight 108.8 kg (239 lb 13.8 oz), SpO2 96.00%.  No intake or output data in the 24 hours ending 04/01/14 1041  Exam Awake Alert, Oriented *3, No new F.N deficits, Normal affect [place].AT,PERRAL Supple Neck,No JVD, No cervical lymphadenopathy appriciated.  Symmetrical Chest wall movement, Good air movement bilaterally, CTAB RRR,No Gallops,Rubs or new Murmurs, No Parasternal Heave +ve B.Sounds, Abd Soft, Non tender, No organomegaly appriciated, No rebound -guarding or rigidity. No Cyanosis, Clubbing or edema, No new Rash or bruise  Data Review   CBC w Diff:  Lab Results  Component Value Date   WBC 12.6* 04/01/2014   HGB 11.7* 04/01/2014   HCT 35.7* 04/01/2014   PLT 160 04/01/2014   LYMPHOPCT 9* 03/31/2014   MONOPCT 5 03/31/2014   EOSPCT 1 03/31/2014   BASOPCT 0 03/31/2014    CMP:  Lab Results  Component Value Date   NA 138 04/01/2014   K 4.0 04/01/2014   CL 102 04/01/2014   CO2 21 04/01/2014   BUN 17 04/01/2014   CREATININE 0.72 04/01/2014   PROT 6.8 04/01/2014   ALBUMIN 3.4* 04/01/2014   BILITOT 0.4 04/01/2014   ALKPHOS 39 04/01/2014   AST 18 04/01/2014   ALT 18 04/01/2014  .   Total Time in preparing paper work, data evaluation and todays exam - 35 minutes  Thurnell Lose M.D on 04/01/2014 at 10:41 AM  [facility] Office  [phone]

## 2014-04-01 NOTE — Progress Notes (Signed)
Utilization review completed. Swannie Milius, RN, BSN. 

## 2014-04-01 NOTE — H&P (Signed)
[facility] History and Physical  Patient: Bailey [name]  EPP:[id number]  DOB: [birth year]  DOS: the patient was seen and examined on 03/31/2014 PCP: Christianne Borrow, MD  Chief Complaint: Right flank pain  HPI: [name] is a 68 y.o. female with Past medical history of diabetes, recurrent renal stone, hypertension, asthma, sleep apnea. The patient complains of right flank pain. She mentions she was at her baseline yesterday and when she woke up in this morning she started having severe right-sided flank pain. The pain was radiating to her groin on the right side. She mentions that she has recurrent renal stone every 3 month without any pain with burning and blood in her urine. She has not seen any blood in her urine today she does not have any burning urination. She denies any fever or chills. She denies any abdominal pain or constipation. She mentions that she felt nauseated with the pain and control of the pain improves her pain and nausea. Urology was consulted, Patient was offered stent procedure by urology but since the patient's family member had bad outcome with the stent procedure she prefers to go with ESL directly without any stent procedure, and if ESL is not effective then think about stent procedure. With a recurrent renal stone the patient has never had any workup related to recurrent stones. Patient was given a significantly high doses of fentanyl in the ED to control her pain. And with that high requirement it was felt unsafe for patient to go home on oral narcotics and ED physician consulted hospitalist for admission for pain management and further workup related to her renal stone  The patient is coming from home. And at her baseline INdependent for most of her ADL.  Review of Systems: as mentioned in the history of present illness.  A Comprehensive review of the other systems is negative.  Past Medical History  Diagnosis Date  . Arthritis   . Asthma   .  Diabetes mellitus     mellitus?  . Elevated cholesterol   . HTN (hypertension)   . Kidney stones   . Obesity   . Pneumonia   . Sleep apnea   . Colon polyps 2008   Past Surgical History  Procedure Laterality Date  . Appenedectomy  1958  . Cholecystectomy  1978  . Aortic valve replacement  06/2012   Social History:  reports that she has never smoked. She has never used smokeless tobacco. She reports that she drinks alcohol. She reports that she does not use illicit drugs.  Allergies  Allergen Reactions  . Cyanocobalamin Other (See Comments)    SL tablets. Takes IM shots. Issues because of aortic valve; passes out; "heart stops"  . Dilaudid [Hydromorphone Hcl] Other (See Comments)    Severe hypotension  . Naproxen Shortness Of Breath  . Percocet [Oxycodone-Acetaminophen] Shortness Of Breath  . Latex Rash    welts  . Nsaids     Causes asthma    Family History  Problem Relation Age of Onset  . Colon cancer Neg Hx   . Asthma Daughter   . Asthma Maternal Grandfather   . Heart disease Sister     Prior to Admission medications   Medication Sig Start Date End Date Taking? Authorizing Provider  aspirin 81 MG tablet Take 81 mg by mouth daily.   Yes Historical Provider, MD  Astaxanthin 4 MG CAPS Take by mouth 2 (two) times daily.   Yes Historical Provider, MD  Cholecalciferol (VITAMIN D) 2000  UNITS tablet Take 2,000 Units by mouth daily.     Yes Historical Provider, MD  Cinnamon 500 MG capsule Take 500 mg by mouth daily.   Yes Historical Provider, MD  Coenzyme Q10 (CO Q-10) 100 MG CAPS Take 1 capsule by mouth daily.    Yes Historical Provider, MD  enalapril (VASOTEC) 5 MG tablet Take 5 mg by mouth daily.     Yes Historical Provider, MD  MAGNESIUM PO Take 2 tablets by mouth 2 (two) times daily.    Yes Historical Provider, MD  metFORMIN (GLUCOPHAGE) 500 MG tablet Take 500 mg by mouth 2 (two) times daily with a meal.    Yes Historical Provider, MD  Omega-3 Fatty Acids (FISH OIL) 1000  MG CAPS Take 1 capsule by mouth daily. UAD   Yes Historical Provider, MD  PROAIR HFA 108 (90 BASE) MCG/ACT inhaler Inhale 2 puffs into the lungs every 4 (four) hours as needed for shortness of breath.    Yes Historical Provider, MD  TURMERIC CURCUMIN PO Take 1 tablet by mouth every morning.    Yes Historical Provider, MD    Physical Exam: Filed Vitals:   03/31/14 2000 03/31/14 2100 03/31/14 2326 03/31/14 2336  BP: 142/73 130/84 147/64 134/75  Pulse: 81 77 80 77  Temp:  98 F (36.7 C) 98 F (36.7 C) 98 F (36.7 C)  TempSrc:   Oral Oral  Resp: 18 20 20 20   Height:      Weight:      SpO2: 98% 99% 100% 100%    General: Alert, Awake and Oriented to Time, Place and Person. Appear in marked distress Eyes: PERRL ENT: Oral Mucosa clear moist. Neck: No JVD Cardiovascular: S1 and S2 Present, no Murmur, Peripheral Pulses Present Respiratory: Bilateral Air entry equal and Decreased, Clear to Auscultation,  No Crackles, no wheezes Abdomen: Bowel Sound Present, Soft and Non tender in the front, right CVA tenderness Skin: No Rash Extremities: Trace Pedal edema, no calf tenderness Neurologic: Grossly no focal neuro deficit.  Labs on Admission:  CBC:  Recent Labs Lab 03/31/14 1215  WBC 11.4*  NEUTROABS 9.8*  HGB 12.6  HCT 38.9  MCV 87.0  PLT 168    CMP     Component Value Date/Time   NA 140 03/31/2014 1215   K 4.3 03/31/2014 1215   CL 102 03/31/2014 1215   CO2 24 03/31/2014 1215   GLUCOSE 140* 03/31/2014 1215   BUN 18 03/31/2014 1215   CREATININE 0.74 03/31/2014 1215   CALCIUM 9.2 03/31/2014 1215   PROT 7.3 03/31/2014 1215   ALBUMIN 3.6 03/31/2014 1215   AST 19 03/31/2014 1215   ALT 20 03/31/2014 1215   ALKPHOS 39 03/31/2014 1215   BILITOT 0.3 03/31/2014 1215   GFRNONAA 86* 03/31/2014 1215   GFRAA >90 03/31/2014 1215     Recent Labs Lab 03/31/14 1215  LIPASE 22   No results found for this basename: AMMONIA,  in the last 168 hours  No results found for this basename: CKTOTAL,  CKMB, CKMBINDEX, TROPONINI,  in the last 168 hours BNP (last 3 results) No results found for this basename: PROBNP,  in the last 8760 hours  Radiological Exams on Admission: Ct Abdomen Pelvis Wo Contrast  03/31/2014   CLINICAL DATA:  Right flank pain.  EXAM: CT ABDOMEN AND PELVIS WITHOUT CONTRAST  TECHNIQUE: Multidetector CT imaging of the abdomen and pelvis was performed following the standard protocol without intravenous contrast.  COMPARISON:  CT abdomen and pelvis 01/13/2007.  FINDINGS: The lung bases are clear without focal nodule, mass, or airspace disease. Mitral annulus calcifications are present. The heart size is normal. No significant pleural or pericardial effusion is present.  Diffuse fatty infiltration of the liver is evident. Spleen is within normal limits. Stomach, duodenum and pancreas are within normal limits for age. Patient is status post cholecystectomy. The adrenal glands are normal bilaterally.  An obstructing 14 mm stone is present at the right UPJ with moderate right-sided hydronephrosis and stranding. No additional stones are present in the right kidney. A 4 mm nonobstructing stone is seen posteriorly in the left kidney. The left ureter is within normal limits. The distal right ureter is unremarkable. The urinary bladder is within normal limits.  The rectosigmoid colon is within normal limits. The remainder of the colon is unremarkable. The appendix is not clearly visualized may be surgically absent. Small bowel is unremarkable.  Bone windows demonstrate degenerative anterolisthesis at L4-5. No focal lytic or blastic lesions are present.  IMPRESSION: 1. Obstructing 14 mm stone at through UVJ with moderate right-sided hydronephrosis. 2. Nonobstructing 4 mm left kidney stone. 3. Hepatic steatosis. 4. Mitral anulus calcifications.   Electronically Signed   By: Lawrence Santiago M.D.   On: 03/31/2014 15:33   Assessment/Plan Principal Problem:   Urinary tract obstruction by kidney  stone Active Problems:   History of renal stone   Diabetes   Essential hypertension, benign   Obstructive uropathy   1. Urinary tract obstruction by kidney stone The patient is presenting with complaints of right-sided flank pain. A CT scan is showing evidence of right-sided hydronephrosis with right 14 mm stone at the UP junction. Patient was offered stent procedure for pain management but she prefers to go to the lithotripsy directly. Urology has been consulted who will be assisting an arrangement for lithotripsy for the patient. At present the patient is in significant pain and will be admitted for pain management as well. I would start her on IV hydration, IV fentanyl as needed. I would also continue her with OxyIR as needed for pain management. Flomax will be started for symptomatic relief. Patient's urine appears to have uric acid crystal which suggest that she could have uric acid stone which will benefit from alkalinization of her urine and therefore he would be started on potassium citrate. check urinary pH in morning  2. Diabetes mellitus I would continue her on sliding scale  3. Hypertension Holding lisinopril  Consults: Urology appreciate their input  DVT Prophylaxis: subcutaneous Heparin Nutrition: N.p.o. after midnight  Code Status: Full  Family Communication: Family was present at bedside, opportunity was given to ask question and all questions were answered satisfactorily at the time of interview. Disposition: Admitted to inpatient in med-surge unit.  Author: Berle Mull, MD [facility] Pager: [phone]  If 7PM-7AM, please contact night-coverage [email or web address] Password [email or web address]

## 2014-04-01 NOTE — Progress Notes (Signed)
ANTIBIOTIC CONSULT NOTE - INITIAL  Pharmacy Consult for levofloxacin Indication: UTI  Allergies  Allergen Reactions  . Cyanocobalamin Other (See Comments)    SL tablets. Takes IM shots. Issues because of aortic valve; passes out; "heart stops"  . Dilaudid [Hydromorphone Hcl] Other (See Comments)    Severe hypotension  . Naproxen Shortness Of Breath  . Percocet [Oxycodone-Acetaminophen] Shortness Of Breath    Can tolerate oxy IR  . Latex Rash    welts  . Nsaids     Causes asthma    Patient Measurements: Height: 5\' 2"  (157.5 cm) Weight: 239 lb 13.8 oz (108.8 kg) IBW/kg (Calculated) : 50.1   Vital Signs: Temp: 98 F (36.7 C) (04/24 1000) Temp src: Oral (04/24 1000) BP: 94/52 mmHg (04/24 1000) Pulse Rate: 82 (04/24 1000) Intake/Output from previous day:   Intake/Output from this shift:    Labs:  Recent Labs  03/31/14 1215 04/01/14 0032  WBC 11.4* 12.6*  HGB 12.6 11.7*  PLT 168 160  CREATININE 0.74 0.72   Estimated Creatinine Clearance: 79.3 ml/min (by C-G formula based on Cr of 0.72). No results found for this basename: VANCOTROUGH, VANCOPEAK, VANCORANDOM, GENTTROUGH, GENTPEAK, GENTRANDOM, TOBRATROUGH, TOBRAPEAK, TOBRARND, AMIKACINPEAK, AMIKACINTROU, AMIKACIN,  in the last 72 hours   Microbiology: No results found for this or any previous visit (from the past 720 hour(s)).  Medical History: Past Medical History  Diagnosis Date  . Arthritis   . Asthma   . Diabetes mellitus     mellitus?  . Elevated cholesterol   . HTN (hypertension)   . Kidney stones   . Obesity   . Pneumonia   . Sleep apnea   . Colon polyps 2008    Assessment: 88 YOF with 52mm kidney stone also found to have UTI to start levofloxacin. To be discharged today in order to have outpatient lithotripsy completed per urology. SCr 0.72 with est CrCl ~6mL/min. WBC 12.6, afebrile.  Goal of Therapy:  eradication of infection with correctly dosed antibiotics  Plan:  1. Levofloxacin 750mg   PO x5 days for complicated UTI since she has obstruction 2. Pharmacy to sign off as being discharged and do not anticipate renal adjustment of medication  Anicia Leuthold D. Roanne Haye, PharmD, BCPS Clinical Pharmacist Pager: [phone] 04/01/2014 10:39 AM

## 2014-04-02 LAB — URINE CULTURE

## 2014-04-06 DIAGNOSIS — R109 Unspecified abdominal pain: Secondary | ICD-10-CM | POA: Diagnosis not present

## 2014-04-06 DIAGNOSIS — R9389 Abnormal findings on diagnostic imaging of other specified body structures: Secondary | ICD-10-CM | POA: Diagnosis not present

## 2014-04-06 DIAGNOSIS — N200 Calculus of kidney: Secondary | ICD-10-CM | POA: Diagnosis not present

## 2014-04-07 DIAGNOSIS — J45909 Unspecified asthma, uncomplicated: Secondary | ICD-10-CM | POA: Diagnosis not present

## 2014-04-07 DIAGNOSIS — I10 Essential (primary) hypertension: Secondary | ICD-10-CM | POA: Diagnosis not present

## 2014-04-07 DIAGNOSIS — N200 Calculus of kidney: Secondary | ICD-10-CM | POA: Diagnosis not present

## 2014-04-07 DIAGNOSIS — E119 Type 2 diabetes mellitus without complications: Secondary | ICD-10-CM | POA: Diagnosis not present

## 2014-04-08 ENCOUNTER — Encounter: Payer: Medicare Other | Admitting: Gastroenterology

## 2014-04-15 DIAGNOSIS — N200 Calculus of kidney: Secondary | ICD-10-CM | POA: Diagnosis not present

## 2014-04-15 DIAGNOSIS — N1330 Unspecified hydronephrosis: Secondary | ICD-10-CM | POA: Diagnosis not present

## 2014-04-21 ENCOUNTER — Encounter: Payer: Self-pay | Admitting: Gastroenterology

## 2014-04-27 ENCOUNTER — Encounter: Payer: Medicare Other | Admitting: Internal Medicine

## 2014-04-27 ENCOUNTER — Encounter: Payer: Medicare Other | Admitting: Gastroenterology

## 2014-05-06 ENCOUNTER — Telehealth: Payer: Self-pay | Admitting: *Deleted

## 2014-05-06 NOTE — Telephone Encounter (Signed)
Patient was transferred directly to [facility] 4th floor and states that she has lost her prep instructions.  Also wanted to know about stopping her Potassium.  Ritta Slot, RN went over the instructions in detail to patient over the phone.  All questions were answered and patient was advised to take all medications prior to 6:30 am but hold diabetic medications day of procedure.

## 2014-05-09 ENCOUNTER — Encounter: Payer: Self-pay | Admitting: Gastroenterology

## 2014-05-09 ENCOUNTER — Ambulatory Visit (AMBULATORY_SURGERY_CENTER): Payer: Medicare Other | Admitting: Gastroenterology

## 2014-05-09 VITALS — BP 128/58 | HR 73 | Temp 96.7°F | Resp 20 | Ht 62.0 in | Wt 217.0 lb

## 2014-05-09 DIAGNOSIS — Z86010 Personal history of colonic polyps: Secondary | ICD-10-CM

## 2014-05-09 DIAGNOSIS — D126 Benign neoplasm of colon, unspecified: Secondary | ICD-10-CM

## 2014-05-09 DIAGNOSIS — E119 Type 2 diabetes mellitus without complications: Secondary | ICD-10-CM | POA: Diagnosis not present

## 2014-05-09 DIAGNOSIS — J45909 Unspecified asthma, uncomplicated: Secondary | ICD-10-CM | POA: Diagnosis not present

## 2014-05-09 DIAGNOSIS — G4733 Obstructive sleep apnea (adult) (pediatric): Secondary | ICD-10-CM | POA: Diagnosis not present

## 2014-05-09 DIAGNOSIS — I10 Essential (primary) hypertension: Secondary | ICD-10-CM | POA: Diagnosis not present

## 2014-05-09 HISTORY — PX: COLONOSCOPY: SHX5424

## 2014-05-09 LAB — GLUCOSE, CAPILLARY
GLUCOSE-CAPILLARY: 109 mg/dL — AB (ref 70–99)
Glucose-Capillary: 104 mg/dL — ABNORMAL HIGH (ref 70–99)

## 2014-05-09 LAB — HM COLONOSCOPY: HM Colonoscopy: ABNORMAL

## 2014-05-09 MED ORDER — SODIUM CHLORIDE 0.9 % IV SOLN: 500.0000 mL | INTRAVENOUS | Status: DC

## 2014-05-09 NOTE — Patient Instructions (Signed)
Colon polyps x 3 removed today, see handout on polyps. Repeat colonoscopy in 3 years.  Hold aspirin, NSAIDS, anti-inflammatory medications for 2 weeks.  Call us with any questions or concerns. Thank you!  YOU HAD AN ENDOSCOPIC PROCEDURE TODAY AT [facility]: Refer to the procedure report that was given to you for any specific questions about what was found during the examination.  If the procedure report does not answer your questions, please call your gastroenterologist to clarify.  If you requested that your care partner not be given the details of your procedure findings, then the procedure report has been included in a sealed envelope for you to review at your convenience later.  YOU SHOULD EXPECT: Some feelings of bloating in the abdomen. Passage of more gas than usual.  Walking can help get rid of the air that was put into your GI tract during the procedure and reduce the bloating. If you had a lower endoscopy (such as a colonoscopy or flexible sigmoidoscopy) you may notice spotting of blood in your stool or on the toilet paper. If you underwent a bowel prep for your procedure, then you may not have a normal bowel movement for a few days.  DIET: Your first meal following the procedure should be a light meal and then it is ok to progress to your normal diet.  A half-sandwich or bowl of soup is an example of a good first meal.  Heavy or fried foods are harder to digest and may make you feel nauseous or bloated.  Likewise meals heavy in dairy and vegetables can cause extra gas to form and this can also increase the bloating.  Drink plenty of fluids but you should avoid alcoholic beverages for 24 hours.  ACTIVITY: Your care partner should take you home directly after the procedure.  You should plan to take it easy, moving slowly for the rest of the day.  You can resume normal activity the day after the procedure however you should NOT DRIVE or use heavy machinery for 24 hours (because of  the sedation medicines used during the test).    SYMPTOMS TO REPORT IMMEDIATELY: A gastroenterologist can be reached at any hour.  During normal business hours, 8:30 AM to 5:00 PM Monday through Friday, call [phone].  After hours and on weekends, please call the GI answering service at [phone] who will take a message and have the physician on call contact you.   Following lower endoscopy (colonoscopy or flexible sigmoidoscopy):  Excessive amounts of blood in the stool  Significant tenderness or worsening of abdominal pains  Swelling of the abdomen that is new, acute  Fever of 100F or higher  Following upper endoscopy (EGD)  Vomiting of blood or coffee ground material  New chest pain or pain under the shoulder blades  Painful or persistently difficult swallowing  New shortness of breath  Fever of 100F or higher  Black, tarry-looking stools  FOLLOW UP: If any biopsies were taken you will be contacted by phone or by letter within the next 1-3 weeks.  Call your gastroenterologist if you have not heard about the biopsies in 3 weeks.  Our staff will call the home number listed on your records the next business day following your procedure to check on you and address any questions or concerns that you may have at that time regarding the information given to you following your procedure. This is a courtesy call and so if there is no answer at the  home number and we have not heard from you through the emergency physician on call, we will assume that you have returned to your regular daily activities without incident.  SIGNATURES/CONFIDENTIALITY: You and/or your care partner have signed paperwork which will be entered into your electronic medical record.  These signatures attest to the fact that that the information above on your After Visit Summary has been reviewed and is understood.  Full responsibility of the confidentiality of this discharge information lies with you and/or your  care-partner.

## 2014-05-09 NOTE — Op Note (Signed)
[facility]  [address] [place], [id number]   COLONOSCOPY PROCEDURE REPORT  PATIENT: Bailey [name], Bailey [name]  MR#: [id number] BIRTHDATE: 08/10/1946 , 89  yrs. old GENDER: Female ENDOSCOPIST: Milus Banister, MD PROCEDURE DATE:  05/09/2014 PROCEDURE:   Colonoscopy with snare polypectomy First Screening Colonoscopy - Avg.  risk and is 50 yrs.  old or older - No.  Prior Negative Screening - Now for repeat screening. N/A  History of Adenoma - Now for follow-up colonoscopy & has been > or = to 3 yrs.  Yes hx of adenoma.  Has been 3 or more years since last colonoscopy.  Polyps Removed Today? Yes. ASA CLASS:   Class II INDICATIONS:3 small adenomas removed 2012 ([facility]), also previous precancerous polyps Dr.  Allen Norris (2008, 2009). MEDICATIONS: MAC sedation, administered by CRNA and propofol (Diprivan) 250mg  IV  DESCRIPTION OF PROCEDURE:   After the risks benefits and alternatives of the procedure were thoroughly explained, informed consent was obtained.  A digital rectal exam revealed no abnormalities of the rectum.   The LB [id number]  endoscope was introduced through the anus and advanced to the cecum, which was identified by both the appendix and ileocecal valve. No adverse events experienced.   The quality of the prep was good.  The instrument was then slowly withdrawn as the colon was fully examined.  COLON FINDINGS: Three polyps were found, removed and sent to pathology.  Two of them were located in ascending segment, sessile, 4-93mm across, removed with cold snare.  One was 76mm, sessile, located in sigmoid, removed with snare/cautery.  All sent to pathology (jar 1).  The examination was otherwise normal. Retroflexed views revealed no abnormalities. The time to cecum=2 minutes 38 seconds.  Withdrawal time=9 minutes 27 seconds.  The scope was withdrawn and the procedure completed. COMPLICATIONS: There were no complications.  ENDOSCOPIC IMPRESSION: Three polyps  were found, removed and sent to pathology. The examination was otherwise normal.  RECOMMENDATIONS: If the polyp(s) removed today are proven to be adenomatous (pre-cancerous) polyps, you will need a colonoscopy in 3 years. You will receive a letter within 1-2 weeks with the results of your biopsy as well as final recommendations.  Please call my office if you have not received a letter after 3 weeks.  eSigned:  Milus Banister, MD 05/09/2014 9:42 AM

## 2014-05-09 NOTE — Progress Notes (Signed)
Procedure ends, to recovery, report given and VSS. 

## 2014-05-09 NOTE — Progress Notes (Signed)
Called to room to assist during endoscopic procedure.  Patient ID and intended procedure confirmed with present staff. Received instructions for my participation in the procedure from the performing physician.  

## 2014-05-10 ENCOUNTER — Telehealth: Payer: Self-pay | Admitting: *Deleted

## 2014-05-10 NOTE — Telephone Encounter (Signed)
Message left

## 2014-05-13 ENCOUNTER — Encounter: Payer: Self-pay | Admitting: Gastroenterology

## 2014-06-02 DIAGNOSIS — N1330 Unspecified hydronephrosis: Secondary | ICD-10-CM | POA: Diagnosis not present

## 2014-06-02 DIAGNOSIS — N200 Calculus of kidney: Secondary | ICD-10-CM | POA: Diagnosis not present

## 2014-06-03 ENCOUNTER — Other Ambulatory Visit: Payer: Self-pay | Admitting: Urology

## 2014-06-28 ENCOUNTER — Encounter (HOSPITAL_BASED_OUTPATIENT_CLINIC_OR_DEPARTMENT_OTHER): Payer: Self-pay | Admitting: *Deleted

## 2014-06-28 DIAGNOSIS — I359 Nonrheumatic aortic valve disorder, unspecified: Secondary | ICD-10-CM | POA: Diagnosis not present

## 2014-06-28 DIAGNOSIS — Z01810 Encounter for preprocedural cardiovascular examination: Secondary | ICD-10-CM | POA: Diagnosis not present

## 2014-06-28 DIAGNOSIS — I10 Essential (primary) hypertension: Secondary | ICD-10-CM | POA: Diagnosis not present

## 2014-06-29 ENCOUNTER — Encounter (HOSPITAL_BASED_OUTPATIENT_CLINIC_OR_DEPARTMENT_OTHER): Payer: Self-pay | Admitting: *Deleted

## 2014-06-29 NOTE — Progress Notes (Signed)
NPO AFTER MN. ARRIVE AT 0930. NEEDS ISTAT. PT IS TO CALL [facility] TO FAX LAST EKG AND OFFICE NOTE TO OUR FACILITY, FAX NUMBER GIVEN TO PT.  HER CARDIOLOGIST WAS TO SEND ADVISE ABOUT IV ANTIBIOTIC COVERAGE  TO  DR GRAPEY OFFICE  FOR VALVE REPLACEMENT, LM FOR SELITA FOR UPDATE. PT WAS REASSURED THAT OUR ANESTHESIOLOGIST WOULD KNOW WHAT IV ANTIBIOTIC COVERAGE SHE WOULD NEED.

## 2014-07-03 NOTE — H&P (Signed)
History of Present Illness   Bailey [name] presented in May 2015 for a followup visit. Again, approximately 2 weeks prior to that appointment she presented to [facility] Emergency Room with right significant flank pain. At that time she was noted to have a 14 mm stone in her right renal pelvis. She was seen in consultation by Dr Janice Norrie. Because the lithotripter was not currently available, it was recommended that she consider a double-J stent placement followed by lithotripsy at a later date. The patient wanted to see if lithotripsy was possible and actually inquired to a location that was going to be doing lithotripsy the next day. She subsequently went to [facility] and was treated by Dr Nila Nephew. She underwent lithotripsy and her pain did resolve. Unfortunately, she did not pass any stone fragments. She did follow up with Dr Nila Nephew who obtained an ultrasound and KUB and did not feel like the stone had really fractured well.     Bailey [name] tells me that she has experienced gout in the past. Her urine pH appears to be consistently acidic and she has uric acid crystals in her urine. I suspect the majority of her stones are probably uric acid and may have had a shell of calcium. On KUB in May, there is a questionable faint area in the area of the renal pelvis measuring about 12-14 mm and I suspect this may be the stone. I suspect she has mixed uric acid and calcium composition.     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 We decided to put her on urinary alkalization with potassiums citrate and her repeat imaging studies. If the stone was dissolving we would continue with medical management. If there was no change in the stone despite successful urinary alkalization we would consider ureteroscopy with holmium laser lithotripsy.   Past Medical History Problems  1. History of arthritis (V13.4) 2. History of asthma (V12.69) 3. History of cardiac murmur (V12.59) 4. History of  diabetes mellitus (V12.29) 5. History of hypercholesterolemia (V12.29) 6. History of hypertension (V12.59)  Surgical History Problems  1. History of Aortic Valve Replacement 2. History of Appendectomy 3. History of Cholecystectomy  Current Meds 1. Astaxanthin 4 MG Oral Capsule;  Therapy: (Recorded:08May2015) to Recorded 2. Enalapril Maleate 10 MG Oral Tablet;  Therapy: 29Jan2015 to Recorded 3. Leggett;  Therapy: (Recorded:08May2015) to Recorded 4. MetFORMIN HCl - 500 MG Oral Tablet;  Therapy: 29Jan2015 to Recorded 5. Potassium Citrate ER 15 MEQ (1620 MG) Oral Tablet Extended Release; take 2 tablets  by mouth twice a day;  Therapy: 46EVO3500 to (Evaluate:07Jul2015)  Requested for: 93GHW2993; Last  [id number] Ordered  Allergies Medication  1. Dilaudid TABS 2. Latex Gloves MISC Non-Medication  3. Latex  Family History Problems  1. Family history of cardiac disorder (V17.49) : Sister 2. Family history of heart failure (V17.49) : Father 3. Family history of kidney cancer (V16.51) : Sister 4. Family history of myocardial infarction (V17.3) : Mother  Social History Problems  1. Denied: History of Alcohol use 2. Caffeine use (V49.89)   3-5 qd 3. Divorced 4. Father deceased   34yrs, aortic separation 5. Mother deceased   MI 65. Never a smoker 7. Occupation   dispatcher/ nanny 63. Two children  Review of Systems Genitourinary, constitutional, skin, eye, otolaryngeal, hematologic/lymphatic, cardiovascular, pulmonary, endocrine, musculoskeletal, gastrointestinal, neurological and psychiatric system(s) were reviewed and pertinent findings if present are noted.  Genitourinary: urinary frequency and incontinence.  Gastrointestinal: diarrhea, but no  flank pain and no abdominal pain.  Constitutional: feeling tired (fatigue).    Vitals   Blood Pressure: 104 / 66 Temperature: 97.1 F Heart Rate: 66  Physical Exam Constitutional: Well nourished and well  developed . No acute distress.  Pulmonary: No respiratory distress and normal respiratory rhythm and effort.  Cardiovascular: Heart rate and rhythm are normal . No peripheral edema.  Abdomen: The abdomen is soft and nontender. No masses are palpated. No CVA tenderness. No hernias are palpable. No hepatosplenomegaly noted.  Skin: Normal skin turgor, no visible rash and no visible skin lesions.  Neuro/Psych:. Mood and affect are appropriate.    Results/Data Urine  COLOR YELLOW  APPEARANCE CLEAR  SPECIFIC GRAVITY 1.025  pH 6.0  GLUCOSE NEG mg/dL BILIRUBIN NEG  KETONE NEG mg/dL BLOOD NEG  PROTEIN NEG mg/dL UROBILINOGEN 0.2 mg/dL NITRITE NEG  LEUKOCYTE ESTERASE NEG   Assessment Assessed  1. Nephrolithiasis (592.0)  Plan Health Maintenance  1. UA With REFLEX; [Do Not Release]; Status:Complete;   Done: [id number] 10:21AM Nephrolithiasis  2. Follow-up Schedule Surgery Office  Follow-up  Status: Complete  Done: 25Jun2015  Discussion/Summary     Bailey [name] currently is doing relatively well clinically. She has not had any significant issues with abdominal or flank pain. Her urinalysis today is clear. Urine pH is up somewhat to 6.0. Unfortunately, on repeat CT imaging it does not appear there has been a dramatic change in the size/volume of his right renal calculus. No evidence of any significant hydronephrosis. I do need to await the official radiologist's reading, but I have interpreted her CT today relative to previous imaging. At this point I would recommend consideration for ureteroscopy. I do not think the stone is big enough to really justify a more invasive percutaneous nephrolithotomy. I do think this stone is amenable to flexible ureteroscopy with Holmium laser lithotripsy. We will try to extract as many fragments as possible. Assuming this is indeed uric acid with fragmentation, I would expect the urinary alkalinization to be much more effective. She will require double-J stent  placement status post the procedure for potentially anywhere from 1 week to a month or more. Obviously we cannot guarantee success with ureteroscopy, but this would have a much higher likelihood than a repeat ESWL, since there did not appear to be any good fragmentation with that procedure.      Amendment Discussed all risks benefits of ureteroscopy. 25-30 minutes face-to-face time going over her situation today.

## 2014-07-04 ENCOUNTER — Encounter (HOSPITAL_BASED_OUTPATIENT_CLINIC_OR_DEPARTMENT_OTHER): Payer: Medicare Other | Admitting: Anesthesiology

## 2014-07-04 ENCOUNTER — Other Ambulatory Visit: Payer: Self-pay

## 2014-07-04 ENCOUNTER — Ambulatory Visit (HOSPITAL_BASED_OUTPATIENT_CLINIC_OR_DEPARTMENT_OTHER): Payer: Medicare Other | Admitting: Anesthesiology

## 2014-07-04 ENCOUNTER — Ambulatory Visit (HOSPITAL_BASED_OUTPATIENT_CLINIC_OR_DEPARTMENT_OTHER)
Admission: RE | Admit: 2014-07-04 | Discharge: 2014-07-04 | Disposition: A | Discharge status: Home / Self Care | Payer: Medicare Other | Source: Ambulatory Visit | Attending: Urology | Admitting: Urology

## 2014-07-04 ENCOUNTER — Encounter (HOSPITAL_BASED_OUTPATIENT_CLINIC_OR_DEPARTMENT_OTHER): Payer: Self-pay | Admitting: *Deleted

## 2014-07-04 ENCOUNTER — Encounter (HOSPITAL_BASED_OUTPATIENT_CLINIC_OR_DEPARTMENT_OTHER)
Admission: RE | Disposition: A | Discharge status: Home / Self Care | Payer: Self-pay | Source: Ambulatory Visit | Attending: Urology

## 2014-07-04 DIAGNOSIS — I10 Essential (primary) hypertension: Secondary | ICD-10-CM | POA: Insufficient documentation

## 2014-07-04 DIAGNOSIS — Z79899 Other long term (current) drug therapy: Secondary | ICD-10-CM | POA: Insufficient documentation

## 2014-07-04 DIAGNOSIS — E669 Obesity, unspecified: Secondary | ICD-10-CM | POA: Diagnosis not present

## 2014-07-04 DIAGNOSIS — Z954 Presence of other heart-valve replacement: Secondary | ICD-10-CM | POA: Insufficient documentation

## 2014-07-04 DIAGNOSIS — J45909 Unspecified asthma, uncomplicated: Secondary | ICD-10-CM | POA: Diagnosis not present

## 2014-07-04 DIAGNOSIS — E7800 Pure hypercholesterolemia, unspecified: Secondary | ICD-10-CM | POA: Insufficient documentation

## 2014-07-04 DIAGNOSIS — G4730 Sleep apnea, unspecified: Secondary | ICD-10-CM | POA: Insufficient documentation

## 2014-07-04 DIAGNOSIS — E119 Type 2 diabetes mellitus without complications: Secondary | ICD-10-CM | POA: Diagnosis not present

## 2014-07-04 DIAGNOSIS — Z9089 Acquired absence of other organs: Secondary | ICD-10-CM | POA: Diagnosis not present

## 2014-07-04 DIAGNOSIS — N200 Calculus of kidney: Secondary | ICD-10-CM | POA: Insufficient documentation

## 2014-07-04 DIAGNOSIS — D649 Anemia, unspecified: Secondary | ICD-10-CM | POA: Insufficient documentation

## 2014-07-04 HISTORY — DX: Other seasonal allergic rhinitis: J30.2

## 2014-07-04 HISTORY — PX: HOLMIUM LASER APPLICATION: SHX5852

## 2014-07-04 HISTORY — DX: Presence of prosthetic heart valve: Z95.2

## 2014-07-04 HISTORY — DX: Calculus of ureter: N20.1

## 2014-07-04 HISTORY — PX: CYSTOSCOPY/RETROGRADE/URETEROSCOPY: SHX5316

## 2014-07-04 HISTORY — DX: Obstructive sleep apnea (adult) (pediatric): G47.33

## 2014-07-04 HISTORY — DX: Personal history of colon polyps, unspecified: Z86.0100

## 2014-07-04 HISTORY — DX: Vitamin B12 deficiency anemia, unspecified: D51.9

## 2014-07-04 HISTORY — DX: Personal history of urinary calculi: Z87.442

## 2014-07-04 HISTORY — DX: Calculus of kidney: N20.0

## 2014-07-04 HISTORY — DX: Type 2 diabetes mellitus without complications: E11.9

## 2014-07-04 HISTORY — DX: Hyperlipidemia, unspecified: E78.5

## 2014-07-04 HISTORY — DX: Personal history of colonic polyps: Z86.010

## 2014-07-04 LAB — POCT I-STAT 4, (NA,K, GLUC, HGB,HCT)
Glucose, Bld: 107 mg/dL — ABNORMAL HIGH (ref 70–99)
HCT: 42 % (ref 36.0–46.0)
Hemoglobin: 14.3 g/dL (ref 12.0–15.0)
Potassium: 4.1 mEq/L (ref 3.7–5.3)
SODIUM: 140 meq/L (ref 137–147)

## 2014-07-04 LAB — GLUCOSE, CAPILLARY: GLUCOSE-CAPILLARY: 113 mg/dL — AB (ref 70–99)

## 2014-07-04 SURGERY — CYSTOSCOPY/RETROGRADE/URETEROSCOPY
Anesthesia: General | Site: Ureter | Laterality: Right

## 2014-07-04 MED ORDER — LIDOCAINE HCL 2 % EX GEL
CUTANEOUS | Status: DC | PRN
  Administered 2014-07-04: 1 via URETHRAL

## 2014-07-04 MED ORDER — DEXAMETHASONE SODIUM PHOSPHATE 4 MG/ML IJ SOLN
INTRAMUSCULAR | Status: DC | PRN
  Administered 2014-07-04: 10 mg via INTRAVENOUS

## 2014-07-04 MED ORDER — MIDAZOLAM HCL 5 MG/5ML IJ SOLN
INTRAMUSCULAR | Status: DC | PRN
  Administered 2014-07-04: 2 mg via INTRAVENOUS

## 2014-07-04 MED ORDER — FENTANYL CITRATE 0.05 MG/ML IJ SOLN
INTRAMUSCULAR | Status: AC
  Filled 2014-07-04: qty 2

## 2014-07-04 MED ORDER — FENTANYL CITRATE 0.05 MG/ML IJ SOLN
INTRAMUSCULAR | Status: AC
Start: 2014-07-04 — End: 2014-07-04
  Filled 2014-07-04: qty 2

## 2014-07-04 MED ORDER — ACETAMINOPHEN 10 MG/ML IV SOLN
INTRAVENOUS | Status: DC | PRN
  Administered 2014-07-04: 1000 mg via INTRAVENOUS

## 2014-07-04 MED ORDER — BELLADONNA ALKALOIDS-OPIUM 16.2-60 MG RE SUPP
RECTAL | Status: DC | PRN
  Administered 2014-07-04: 1 via RECTAL

## 2014-07-04 MED ORDER — PROMETHAZINE HCL 25 MG/ML IJ SOLN
6.2500 mg | INTRAMUSCULAR | Status: DC | PRN
  Filled 2014-07-04: qty 1

## 2014-07-04 MED ORDER — FENTANYL CITRATE 0.05 MG/ML IJ SOLN
INTRAMUSCULAR | Status: DC | PRN
Start: 2014-07-04 — End: 2014-07-05
  Administered 2014-07-04 (×2): 50 ug via INTRAVENOUS

## 2014-07-04 MED ORDER — CEFAZOLIN SODIUM-DEXTROSE 2-3 GM-% IV SOLR
2.0000 g | INTRAVENOUS | Status: AC
  Administered 2014-07-04: 2 g via INTRAVENOUS
  Filled 2014-07-04: qty 50

## 2014-07-04 MED ORDER — PHENAZOPYRIDINE HCL 100 MG PO TABS
ORAL_TABLET | ORAL | Status: AC
  Filled 2014-07-04: qty 1

## 2014-07-04 MED ORDER — LIDOCAINE HCL (CARDIAC) 20 MG/ML IV SOLN
INTRAVENOUS | Status: DC | PRN
  Administered 2014-07-04: 70 mg via INTRAVENOUS

## 2014-07-04 MED ORDER — METOCLOPRAMIDE HCL 5 MG/ML IJ SOLN
INTRAMUSCULAR | Status: DC | PRN
  Administered 2014-07-04: 10 mg via INTRAVENOUS

## 2014-07-04 MED ORDER — MIDAZOLAM HCL 2 MG/2ML IJ SOLN
INTRAMUSCULAR | Status: AC
  Filled 2014-07-04: qty 2

## 2014-07-04 MED ORDER — ALUM & MAG HYDROXIDE-SIMETH 200-200-20 MG/5ML PO SUSP
30.0000 mL | Freq: Once | ORAL | Status: AC
  Administered 2014-07-04: 30 mL via ORAL
  Filled 2014-07-04: qty 30

## 2014-07-04 MED ORDER — PHENAZOPYRIDINE HCL 200 MG PO TABS: 200.0000 mg | ORAL_TABLET | Freq: Three times a day (TID) | ORAL | Status: DC | PRN

## 2014-07-04 MED ORDER — PROPOFOL 10 MG/ML IV BOLUS
INTRAVENOUS | Status: DC | PRN
  Administered 2014-07-04: 180 mg via INTRAVENOUS

## 2014-07-04 MED ORDER — BELLADONNA ALKALOIDS-OPIUM 16.2-60 MG RE SUPP
RECTAL | Status: AC
  Filled 2014-07-04: qty 1

## 2014-07-04 MED ORDER — ONDANSETRON HCL 4 MG/2ML IJ SOLN
INTRAMUSCULAR | Status: DC | PRN
  Administered 2014-07-04: 4 mg via INTRAVENOUS

## 2014-07-04 MED ORDER — SODIUM CHLORIDE 0.9 % IR SOLN
Status: DC | PRN
  Administered 2014-07-04: 6000 mL

## 2014-07-04 MED ORDER — LACTATED RINGERS IV SOLN
INTRAVENOUS | Status: DC
  Administered 2014-07-04: 10:00:00 via INTRAVENOUS
  Filled 2014-07-04: qty 1000

## 2014-07-04 MED ORDER — IOHEXOL 350 MG/ML SOLN
INTRAVENOUS | Status: DC | PRN
  Administered 2014-07-04: 5 mL

## 2014-07-04 MED ORDER — FENTANYL CITRATE 0.05 MG/ML IJ SOLN
INTRAMUSCULAR | Status: AC
  Filled 2014-07-04: qty 4

## 2014-07-04 MED ORDER — FENTANYL CITRATE 0.05 MG/ML IJ SOLN
25.0000 ug | INTRAMUSCULAR | Status: DC | PRN
  Administered 2014-07-04 (×2): 25 ug via INTRAVENOUS
  Filled 2014-07-04: qty 1

## 2014-07-04 MED ORDER — PHENAZOPYRIDINE HCL 200 MG PO TABS
200.0000 mg | ORAL_TABLET | Freq: Three times a day (TID) | ORAL | Status: DC | PRN
  Administered 2014-07-04: 200 mg via ORAL
  Filled 2014-07-04: qty 1

## 2014-07-04 SURGICAL SUPPLY — 36 items
ADAPTER CATH URET PLST 4-6FR (CATHETERS) ×3 IMPLANT
BAG DRAIN URO-CYSTO SKYTR STRL (DRAIN) ×3 IMPLANT
BASKET LASER NITINOL 1.9FR (BASKET) IMPLANT
BASKET STNLS GEMINI 4WIRE 3FR (BASKET) IMPLANT
BASKET ZERO TIP NITINOL 2.4FR (BASKET) ×3 IMPLANT
BENZOIN TINCTURE PRP APPL 2/3 (GAUZE/BANDAGES/DRESSINGS) IMPLANT
CANISTER SUCT LVC 12 LTR MEDI- (MISCELLANEOUS) ×3 IMPLANT
CATH INTERMIT  6FR 70CM (CATHETERS) IMPLANT
CATH URET 5FR 28IN CONE TIP (BALLOONS)
CATH URET 5FR 28IN OPEN ENDED (CATHETERS) ×3 IMPLANT
CATH URET 5FR 70CM CONE TIP (BALLOONS) IMPLANT
CLOTH BEACON ORANGE TIMEOUT ST (SAFETY) ×3 IMPLANT
DRAPE CAMERA CLOSED 9X96 (DRAPES) ×3 IMPLANT
DRSG TEGADERM 2-3/8X2-3/4 SM (GAUZE/BANDAGES/DRESSINGS) ×3 IMPLANT
FIBER LASER FLEXIVA 1000 (UROLOGICAL SUPPLIES) IMPLANT
FIBER LASER FLEXIVA 200 (UROLOGICAL SUPPLIES) IMPLANT
FIBER LASER FLEXIVA 365 (UROLOGICAL SUPPLIES) IMPLANT
FIBER LASER FLEXIVA 550 (UROLOGICAL SUPPLIES) IMPLANT
FIBER LASER TRAC TIP (UROLOGICAL SUPPLIES) ×6 IMPLANT
GLOVE BIO SURGEON STRL SZ7.5 (GLOVE) ×6 IMPLANT
GOWN STRL REIN XL XLG (GOWN DISPOSABLE) IMPLANT
GOWN STRL REUS W/ TWL XL LVL3 (GOWN DISPOSABLE) ×1 IMPLANT
GOWN STRL REUS W/TWL XL LVL3 (GOWN DISPOSABLE) ×5 IMPLANT
GUIDEWIRE 0.038 PTFE COATED (WIRE) IMPLANT
GUIDEWIRE ANG ZIPWIRE 038X150 (WIRE) IMPLANT
GUIDEWIRE STR DUAL SENSOR (WIRE) ×3 IMPLANT
IV NS IRRIG 3000ML ARTHROMATIC (IV SOLUTION) ×6 IMPLANT
KIT BALLIN UROMAX 15FX10 (LABEL) IMPLANT
KIT BALLN UROMAX 15FX4 (MISCELLANEOUS) IMPLANT
KIT BALLN UROMAX 26 75X4 (MISCELLANEOUS)
NS IRRIG 500ML POUR BTL (IV SOLUTION) IMPLANT
PACK CYSTOSCOPY (CUSTOM PROCEDURE TRAY) ×3 IMPLANT
SET HIGH PRES BAL DIL (LABEL)
SHEATH ACCESS URETERAL 38CM (SHEATH) ×3 IMPLANT
SHEATH ACCESS URETERAL 54CM (SHEATH) IMPLANT
STENT URET 6FRX24 CONTOUR (STENTS) ×3 IMPLANT

## 2014-07-04 NOTE — Interval H&P Note (Signed)
History and Physical Interval Note:  07/04/2014 10:24 AM  Bailey [name]  has presented today for surgery, with the diagnosis of RIGHT RENAL CALCULUS  The various methods of treatment have been discussed with the patient and family. After consideration of risks, benefits and other options for treatment, the patient has consented to  Procedure(s): CYSTOSCOPY/RETROGRADE/URETEROSCOPY (Right) HOLMIUM LASER APPLICATION (Right) as a surgical intervention .  The patient's history has been reviewed, patient examined, no change in status, stable for surgery.  I have reviewed the patient's chart and labs.  Questions were answered to the patient's satisfaction.     [name]

## 2014-07-04 NOTE — Anesthesia Preprocedure Evaluation (Addendum)
Anesthesia Evaluation  Patient identified by MRN, date of birth, ID band Patient awake    Reviewed: Allergy & Precautions, H&P , NPO status , Patient's Chart, lab work & pertinent test results  Airway Mallampati: II TM Distance: >3 FB Neck ROM: Full    Dental no notable dental hx.    Pulmonary asthma , sleep apnea ,  breath sounds clear to auscultation  Pulmonary exam normal       Cardiovascular Exercise Tolerance: Good hypertension, Pt. on medications Rhythm:Regular Rate:Normal  S/P AVR bioprosthetic in 2013. Recent cardiology visit 06-28-14 and everything good. Recent 5 mile hike without symptoms.   Neuro/Psych negative neurological ROS  negative psych ROS   GI/Hepatic negative GI ROS, Neg liver ROS,   Endo/Other  diabetes, Type 2, Oral Hypoglycemic Agents  Renal/GU Renal disease  negative genitourinary   Musculoskeletal negative musculoskeletal ROS (+)   Abdominal (+) + obese,   Peds negative pediatric ROS (+)  Hematology  (+) anemia ,   Anesthesia Other Findings   Reproductive/Obstetrics negative OB ROS                          Anesthesia Physical Anesthesia Plan  ASA: III  Anesthesia Plan: General   Post-op Pain Management:    Induction: Intravenous  Airway Management Planned: LMA  Additional Equipment:   Intra-op Plan:   Post-operative Plan: Extubation in OR  Informed Consent: I have reviewed the patients History and Physical, chart, labs and discussed the procedure including the risks, benefits and alternatives for the proposed anesthesia with the patient or authorized representative who has indicated his/her understanding and acceptance.   Dental advisory given  Plan Discussed with: CRNA  Anesthesia Plan Comments:         Anesthesia Quick Evaluation

## 2014-07-04 NOTE — Progress Notes (Signed)
Patient chest/abdominal discomfort much improved. Patient informed to seek medical attention if cardiac/chest related symptoms reoccur.

## 2014-07-04 NOTE — Progress Notes (Signed)
Called to PACU to evaluate patient with new onset substernal chest discomfort. Patient was sitting upright in bed, awake and alert, appearing in no acute distress at the time of my exam. Bailey [name] describes vague chest pressure which she noted immediately upon arrival from OR to PACU. Pain is dull and non-radiating. She denies any N/V or SOB. Prior surgical scare for AVR in past is noted on exam. Abdomen is diffusely tender with increased pain on deep palpation of abdomen. Epigastric pain also noted on exam which patient describes as different than the chest wall discomfort that she is presently experiencing. Similar pain is solicited upon sternal compression at level of lower sternum. VSS. STAT EKG shows NSR with no acute changes.  Imp: atypical chest wall pain/ abdominal pain Plan: IV analgesic with po Mylanta; continue to observe.

## 2014-07-04 NOTE — Discharge Instructions (Addendum)
[facility] [phone] Post Ureteroscopy With or Without Stent Instructions  Definitions:  Ureter: The duct that transports urine from the kidney to the bladder. Stent:   A plastic hollow tube that is placed into the ureter, from the kidney to the                 bladder to prevent the ureter from swelling shut.  GENERAL INSTRUCTIONS:  Despite the fact that no skin incisions were used, the area around the ureter and bladder is raw and irritated. The stent is a foreign body which will further irritate the bladder wall. This irritation is manifested by increased frequency of urination, both day and night, and by an increase in the urge to urinate. In some, the urge to urinate is present almost always. Sometimes the urge is strong enough that you may not be able to stop yourself from urinating. The only real cure is to remove the stent and then give time for the bladder wall to heal which can't be done until the danger of the ureter swelling shut has passed, which varies.  You may see some blood in your urine while the stent is in place and a few days afterwards. Do not be alarmed, even if the urine was clear for a while. Get off your feet and drink lots of fluids until clearing occurs. If you start to pass clots or don't improve, call us.  DIET: You may return to your normal diet immediately. Because of the raw surface of your bladder, alcohol, spicy foods, acid type foods and drinks with caffeine may cause irritation or frequency and should be used in moderation. To keep your urine flowing freely and to avoid constipation, drink plenty of fluids during the day ( 8-10 glasses ). Tip: Avoid cranberry juice because it is very acidic.  ACTIVITY: Your physical activity doesn't need to be restricted. However, if you are very active, you may see some blood in your urine. We suggest that you reduce your activity under these circumstances until the bleeding has stopped.  BOWELS: It is  important to keep your bowels regular during the postoperative period. Straining with bowel movements can cause bleeding. A bowel movement every other day is reasonable. Use a mild laxative if needed, such as Milk of Magnesia 2-3 tablespoons, or 2 Dulcolax tablets. Call if you continue to have problems. If you have been taking narcotics for pain, before, during or after your surgery, you may be constipated. Take a laxative if necessary.   MEDICATION: You should resume your pre-surgery medications unless told not to. In addition you will often be given an antibiotic to prevent infection. These should be taken as prescribed until the bottles are finished unless you are having an unusual reaction to one of the drugs.  PROBLEMS YOU SHOULD REPORT TO [place]:  Fevers over 100.5 Fahrenheit.  Heavy bleeding, or clots ( See above notes about blood in urine ).  Inability to urinate.  Drug reactions ( hives, rash, nausea, vomiting, diarrhea ).  Severe burning or pain with urination that is not improving.  FOLLOW-UP: You will need a follow-up appointment to monitor your progress. Currently a followup is setup for August 11 at 8:30 AM      Post Anesthesia Home Care Instructions  Activity: Get plenty of rest for the remainder of the day. A responsible adult should stay with you for 24 hours following the procedure.  For the next 24 hours, DO NOT: -Drive a car Film/video editor -  Drink alcoholic beverages -Take any medication unless instructed by your physician -Make any legal decisions or sign important papers.  Meals: Start with liquid foods such as gelatin or soup. Progress to regular foods as tolerated. Avoid greasy, spicy, heavy foods. If nausea and/or vomiting occur, drink only clear liquids until the nausea and/or vomiting subsides. Call your physician if vomiting continues.  Special Instructions/Symptoms: Your throat may feel dry or sore from the anesthesia or the breathing tube placed  in your throat during surgery. If this causes discomfort, gargle with warm salt water. The discomfort should disappear within 24 hours. [facility] [phone] Post Ureteroscopy With or Without Stent Instructions  Definitions:  Ureter: The duct that transports urine from the kidney to the bladder. Stent:   A plastic hollow tube that is placed into the ureter, from the kidney to the                 bladder to prevent the ureter from swelling shut.  GENERAL INSTRUCTIONS:  Despite the fact that no skin incisions were used, the area around the ureter and bladder is raw and irritated. The stent is a foreign body which will further irritate the bladder wall. This irritation is manifested by increased frequency of urination, both day and night, and by an increase in the urge to urinate. In some, the urge to urinate is present almost always. Sometimes the urge is strong enough that you may not be able to stop yourself from urinating. The only real cure is to remove the stent and then give time for the bladder wall to heal which can't be done until the danger of the ureter swelling shut has passed, which varies.  You may see some blood in your urine while the stent is in place and a few days afterwards. Do not be alarmed, even if the urine was clear for a while. Get off your feet and drink lots of fluids until clearing occurs. If you start to pass clots or don't improve, call us.  DIET: You may return to your normal diet immediately. Because of the raw surface of your bladder, alcohol, spicy foods, acid type foods and drinks with caffeine may cause irritation or frequency and should be used in moderation. To keep your urine flowing freely and to avoid constipation, drink plenty of fluids during the day ( 8-10 glasses ). Tip: Avoid cranberry juice because it is very acidic.  ACTIVITY: Your physical activity doesn't need to be restricted. However, if you are very active, you may see some  blood in your urine. We suggest that you reduce your activity under these circumstances until the bleeding has stopped.  BOWELS: It is important to keep your bowels regular during the postoperative period. Straining with bowel movements can cause bleeding. A bowel movement every other day is reasonable. Use a mild laxative if needed, such as Milk of Magnesia 2-3 tablespoons, or 2 Dulcolax tablets. Call if you continue to have problems. If you have been taking narcotics for pain, before, during or after your surgery, you may be constipated. Take a laxative if necessary.   MEDICATION: You should resume your pre-surgery medications unless told not to. In addition you will often be given an antibiotic to prevent infection. These should be taken as prescribed until the bottles are finished unless you are having an unusual reaction to one of the drugs.  PROBLEMS YOU SHOULD REPORT TO [place]:  Fevers over 100.5 Fahrenheit.  Heavy bleeding, or clots ( See above  notes about blood in urine ).  Inability to urinate.  Drug reactions ( hives, rash, nausea, vomiting, diarrhea ).  Severe burning or pain with urination that is not improving.  FOLLOW-UP: You will need a follow-up appointment to monitor your progress. Call for this appointment at the number listed above. Usually the first appointment will be about three to fourteen days after your surgery.

## 2014-07-04 NOTE — Op Note (Signed)
Preoperative diagnosis: Right renal calculus Postoperative diagnosis: Same  Procedure: Cystoscopy, right retrograde pyelography, flexible ureteroscopy, holmium laser lithotripsy, double-J stent placement   Surgeon: Bernestine Amass M.D.  Anesthesia: Gen.  Indications: Patient is 68 years of age. Several months [facility]. At that time she was diagnosed with a 14 mm stone in her right renal pelvis. She was seen by the on-call urologist but because the lithotriptor was not immediately available the patient chose to leave [place] and travel to aspirate [place] where she was treated by an outside urologist with ESWL. There was poor fragmentation of the stone. We felt that given several findings that the stone may be comprised of uric acid and suggested a trial of alkalinization. Followup imaging did not reveal any substantial change to the stone. She has done well clinically without evidence of significant flank discomfort. At this point we have recommended definitive intervention for the stone and felt that at this point ureteroscopy with holmium laser lithotripsy made the most sense. She accepted that recommendation and full informed consent has been obtained. She presents now for an attempt at definitive management of this large right renal calculus.     Technique and findings: Patient was brought the operating room where she had successful induction of general anesthesia. She was placed in lithotomy position prepped and draped in usual manner. She received perioperative antibiotics. PAS compression boots were utilized. Appropriate surgical timeout was performed. Cystoscopy revealed no significant abnormalities within the bladder or urethra. Retrograde pyelogram was then performed on the right side.   This was done with an open-ended catheter. A large filling defect was noted in the right renal pelvis. There did not appear to be a  significant degree of obstruction or hydronephrosis at this time. Retrograde pyelogram interpretation was done in real-time with fluoroscopic imaging.   A guidewire was placed to the right renal pelvis. A flexible ureteroscope access sheath was then placed over the guidewire. Flexible ureteroscope was then inserted and advanced to the right renal pelvis where the stone was encountered. Stone appeared to be approximately 1.5 cm. Holmium laser fiber was utilized at 0.8 J x8 Hz. The stone was broken into innumerable pieces. It did not appear that any fragments were larger than 3-4 mm. We basket extracted several fragments for stone analysis. I did not feel it was feasible to remove all the fragments. No obvious injury to the ureter or collecting system occurred. Over the guidewire we suction we placed a 6 French x24 cm double-J stent. Good positioning confirmed fluoroscopically as well as with direct vision. No obvious complications occurred the patient was brought to recovery room in stable condition.

## 2014-07-05 ENCOUNTER — Encounter (HOSPITAL_BASED_OUTPATIENT_CLINIC_OR_DEPARTMENT_OTHER): Payer: Self-pay | Admitting: Urology

## 2014-07-05 NOTE — Progress Notes (Signed)
Received call from pt.  Pt voiced concern that she gained 5 pounds overnight.  Denies shortness of breath or pain.  Pt denies talking w/ Dr. Risa Grill.  Pt encouraged to call Dr. Risa Grill.  If he isn't available, she can speak w/ his nurse or the FNP. Pt reminded that she does have a PCP and cardiologist if she feels the need to speak w/ them. Pt verbalized her understanding and states that she will follow up w/ Dr. Risa Grill 1st.

## 2014-07-05 NOTE — Transfer of Care (Signed)
Immediate Anesthesia Transfer of Care Note  Patient: Bailey [name]  Procedure(s) Performed: Procedure(s) (LRB): CYSTOSCOPY/RETROGRADE/URETEROSCOPY (Right) HOLMIUM LASER APPLICATION (Right)  Patient Location: PACU  Anesthesia Type: General  Level of Consciousness: awake, alert  and oriented  Airway & Oxygen Therapy: Patient Spontanous Breathing and Patient connected to face mask oxygen  Post-op Assessment: Report given to PACU RN and Post -op Vital signs reviewed and stable  Post vital signs: Reviewed and stable  Complications: No apparent anesthesia complications

## 2014-07-05 NOTE — Anesthesia Postprocedure Evaluation (Signed)
  Anesthesia Post-op Note  Patient: Bailey [name]  Procedure(s) Performed: Procedure(s) (LRB): CYSTOSCOPY/RETROGRADE/URETEROSCOPY (Right) HOLMIUM LASER APPLICATION (Right)  Patient Location: PACU  Anesthesia Type: General  Level of Consciousness: awake and alert   Airway and Oxygen Therapy: Patient Spontanous Breathing  Post-op Pain: mild  Post-op Assessment: Post-op Vital signs reviewed, Patient's Cardiovascular Status Stable, Respiratory Function Stable, Patent Airway and No signs of Nausea or vomiting  Last Vitals:  Filed Vitals:   07/04/14 1511  BP: 134/69  Pulse: 74  Temp:   Resp: 16    Post-op Vital Signs: stable   Complications: No apparent anesthesia complications

## 2014-07-19 DIAGNOSIS — N200 Calculus of kidney: Secondary | ICD-10-CM | POA: Diagnosis not present

## 2014-07-19 DIAGNOSIS — N1330 Unspecified hydronephrosis: Secondary | ICD-10-CM | POA: Diagnosis not present

## 2014-09-04 ENCOUNTER — Encounter: Payer: Self-pay | Admitting: Gastroenterology

## 2014-10-13 DIAGNOSIS — N200 Calculus of kidney: Secondary | ICD-10-CM | POA: Diagnosis not present

## 2014-11-17 DIAGNOSIS — Z87442 Personal history of urinary calculi: Secondary | ICD-10-CM | POA: Diagnosis not present

## 2014-11-17 DIAGNOSIS — D473 Essential (hemorrhagic) thrombocythemia: Secondary | ICD-10-CM | POA: Diagnosis not present

## 2014-11-17 DIAGNOSIS — E119 Type 2 diabetes mellitus without complications: Secondary | ICD-10-CM | POA: Diagnosis not present

## 2014-11-17 DIAGNOSIS — E785 Hyperlipidemia, unspecified: Secondary | ICD-10-CM | POA: Diagnosis not present

## 2014-11-17 DIAGNOSIS — I10 Essential (primary) hypertension: Secondary | ICD-10-CM | POA: Diagnosis not present

## 2014-11-17 DIAGNOSIS — Z713 Dietary counseling and surveillance: Secondary | ICD-10-CM | POA: Diagnosis not present

## 2014-11-17 DIAGNOSIS — R5381 Other malaise: Secondary | ICD-10-CM | POA: Diagnosis not present

## 2014-11-17 LAB — LIPID PANEL
Cholesterol: 201 mg/dL — AB (ref 0–200)
HDL: 61 mg/dL (ref 35–70)
LDL Cholesterol: 118 mg/dL
Triglycerides: 111 mg/dL (ref 40–160)

## 2014-11-22 ENCOUNTER — Ambulatory Visit: Payer: Self-pay | Admitting: Medical

## 2014-11-29 DIAGNOSIS — S8391XA Sprain of unspecified site of right knee, initial encounter: Secondary | ICD-10-CM | POA: Diagnosis not present

## 2014-12-24 ENCOUNTER — Emergency Department: Payer: Self-pay | Admitting: Emergency Medicine

## 2014-12-24 DIAGNOSIS — E119 Type 2 diabetes mellitus without complications: Secondary | ICD-10-CM | POA: Diagnosis not present

## 2014-12-24 DIAGNOSIS — I10 Essential (primary) hypertension: Secondary | ICD-10-CM | POA: Diagnosis not present

## 2014-12-24 DIAGNOSIS — M79605 Pain in left leg: Secondary | ICD-10-CM | POA: Diagnosis not present

## 2014-12-24 DIAGNOSIS — M79662 Pain in left lower leg: Secondary | ICD-10-CM | POA: Diagnosis not present

## 2014-12-24 DIAGNOSIS — S80812A Abrasion, left lower leg, initial encounter: Secondary | ICD-10-CM | POA: Diagnosis not present

## 2014-12-24 DIAGNOSIS — Z79899 Other long term (current) drug therapy: Secondary | ICD-10-CM | POA: Diagnosis not present

## 2014-12-24 LAB — CBC WITH DIFFERENTIAL/PLATELET
BASOS ABS: 0.1 10*3/uL (ref 0.0–0.1)
Basophil %: 0.8 %
EOS ABS: 0.2 10*3/uL (ref 0.0–0.7)
EOS PCT: 2 %
HCT: 40.1 % (ref 35.0–47.0)
HGB: 13 g/dL (ref 12.0–16.0)
Lymphocyte #: 2.5 10*3/uL (ref 1.0–3.6)
Lymphocyte %: 29.9 %
MCH: 28 pg (ref 26.0–34.0)
MCHC: 32.3 g/dL (ref 32.0–36.0)
MCV: 87 fL (ref 80–100)
MONO ABS: 1.1 x10 3/mm — AB (ref 0.2–0.9)
MONOS PCT: 13.3 %
NEUTROS PCT: 54 %
Neutrophil #: 4.4 10*3/uL (ref 1.4–6.5)
PLATELETS: 176 10*3/uL (ref 150–440)
RBC: 4.63 10*6/uL (ref 3.80–5.20)
RDW: 14.5 % (ref 11.5–14.5)
WBC: 8.2 10*3/uL (ref 3.6–11.0)

## 2014-12-24 LAB — BASIC METABOLIC PANEL
ANION GAP: 8 (ref 7–16)
BUN: 21 mg/dL — ABNORMAL HIGH (ref 7–18)
CHLORIDE: 100 mmol/L (ref 98–107)
CREATININE: 0.76 mg/dL (ref 0.60–1.30)
Calcium, Total: 8.8 mg/dL (ref 8.5–10.1)
Co2: 30 mmol/L (ref 21–32)
EGFR (African American): >60
EGFR (Non-African Amer.): >60
Glucose: 93 mg/dL (ref 65–99)
Osmolality: 278 (ref 275–301)
POTASSIUM: 4 mmol/L (ref 3.5–5.1)
Sodium: 138 mmol/L (ref 136–145)

## 2014-12-24 LAB — TROPONIN I

## 2015-01-05 DIAGNOSIS — E119 Type 2 diabetes mellitus without complications: Secondary | ICD-10-CM | POA: Diagnosis not present

## 2015-01-05 DIAGNOSIS — H52223 Regular astigmatism, bilateral: Secondary | ICD-10-CM | POA: Diagnosis not present

## 2015-01-05 DIAGNOSIS — H5203 Hypermetropia, bilateral: Secondary | ICD-10-CM | POA: Diagnosis not present

## 2015-01-06 LAB — HM DIABETES EYE EXAM

## 2015-03-23 DIAGNOSIS — B0229 Other postherpetic nervous system involvement: Secondary | ICD-10-CM | POA: Diagnosis not present

## 2015-03-23 DIAGNOSIS — G8929 Other chronic pain: Secondary | ICD-10-CM | POA: Diagnosis not present

## 2015-03-23 DIAGNOSIS — E119 Type 2 diabetes mellitus without complications: Secondary | ICD-10-CM | POA: Diagnosis not present

## 2015-03-23 DIAGNOSIS — Z713 Dietary counseling and surveillance: Secondary | ICD-10-CM | POA: Diagnosis not present

## 2015-03-23 DIAGNOSIS — I10 Essential (primary) hypertension: Secondary | ICD-10-CM | POA: Diagnosis not present

## 2015-03-23 DIAGNOSIS — Z954 Presence of other heart-valve replacement: Secondary | ICD-10-CM | POA: Diagnosis not present

## 2015-03-23 DIAGNOSIS — E785 Hyperlipidemia, unspecified: Secondary | ICD-10-CM | POA: Diagnosis not present

## 2015-03-23 DIAGNOSIS — J302 Other seasonal allergic rhinitis: Secondary | ICD-10-CM | POA: Diagnosis not present

## 2015-03-23 DIAGNOSIS — M2550 Pain in unspecified joint: Secondary | ICD-10-CM | POA: Diagnosis not present

## 2015-03-23 DIAGNOSIS — L259 Unspecified contact dermatitis, unspecified cause: Secondary | ICD-10-CM | POA: Diagnosis not present

## 2015-03-23 DIAGNOSIS — J4520 Mild intermittent asthma, uncomplicated: Secondary | ICD-10-CM | POA: Diagnosis not present

## 2015-03-23 LAB — HEMOGLOBIN A1C: Hgb A1c MFr Bld: 6.2 % — AB (ref 4.0–6.0)

## 2015-04-04 DIAGNOSIS — H9202 Otalgia, left ear: Secondary | ICD-10-CM | POA: Diagnosis not present

## 2015-04-04 DIAGNOSIS — H903 Sensorineural hearing loss, bilateral: Secondary | ICD-10-CM | POA: Diagnosis not present

## 2015-04-04 DIAGNOSIS — H6123 Impacted cerumen, bilateral: Secondary | ICD-10-CM | POA: Diagnosis not present

## 2015-08-23 ENCOUNTER — Encounter: Payer: Self-pay | Admitting: Family Medicine

## 2015-08-23 DIAGNOSIS — B0229 Other postherpetic nervous system involvement: Secondary | ICD-10-CM | POA: Insufficient documentation

## 2015-08-23 DIAGNOSIS — Z8639 Personal history of other endocrine, nutritional and metabolic disease: Secondary | ICD-10-CM | POA: Insufficient documentation

## 2015-08-23 DIAGNOSIS — E669 Obesity, unspecified: Secondary | ICD-10-CM | POA: Insufficient documentation

## 2015-08-23 DIAGNOSIS — J4520 Mild intermittent asthma, uncomplicated: Secondary | ICD-10-CM | POA: Insufficient documentation

## 2015-08-23 DIAGNOSIS — E785 Hyperlipidemia, unspecified: Secondary | ICD-10-CM | POA: Insufficient documentation

## 2015-08-23 DIAGNOSIS — M1990 Unspecified osteoarthritis, unspecified site: Secondary | ICD-10-CM | POA: Insufficient documentation

## 2015-08-24 ENCOUNTER — Telehealth: Payer: Self-pay

## 2015-08-24 ENCOUNTER — Ambulatory Visit (INDEPENDENT_AMBULATORY_CARE_PROVIDER_SITE_OTHER): Payer: Medicare Other | Admitting: Family Medicine

## 2015-08-24 ENCOUNTER — Encounter: Payer: Self-pay | Admitting: Family Medicine

## 2015-08-24 ENCOUNTER — Encounter: Payer: Medicare Other | Admitting: Family Medicine

## 2015-08-24 VITALS — BP 124/84 | HR 80 | Temp 98.1°F | Resp 18 | Ht 62.0 in | Wt 210.1 lb

## 2015-08-24 DIAGNOSIS — E119 Type 2 diabetes mellitus without complications: Secondary | ICD-10-CM | POA: Diagnosis not present

## 2015-08-24 DIAGNOSIS — M2550 Pain in unspecified joint: Secondary | ICD-10-CM | POA: Diagnosis not present

## 2015-08-24 DIAGNOSIS — Z23 Encounter for immunization: Secondary | ICD-10-CM | POA: Diagnosis not present

## 2015-08-24 DIAGNOSIS — E669 Obesity, unspecified: Secondary | ICD-10-CM | POA: Diagnosis not present

## 2015-08-24 DIAGNOSIS — Z1239 Encounter for other screening for malignant neoplasm of breast: Secondary | ICD-10-CM | POA: Diagnosis not present

## 2015-08-24 DIAGNOSIS — H9193 Unspecified hearing loss, bilateral: Secondary | ICD-10-CM | POA: Diagnosis not present

## 2015-08-24 DIAGNOSIS — I10 Essential (primary) hypertension: Secondary | ICD-10-CM | POA: Diagnosis not present

## 2015-08-24 DIAGNOSIS — R5383 Other fatigue: Secondary | ICD-10-CM | POA: Diagnosis not present

## 2015-08-24 DIAGNOSIS — Z79899 Other long term (current) drug therapy: Secondary | ICD-10-CM | POA: Diagnosis not present

## 2015-08-24 DIAGNOSIS — Z8639 Personal history of other endocrine, nutritional and metabolic disease: Secondary | ICD-10-CM | POA: Diagnosis not present

## 2015-08-24 DIAGNOSIS — J4520 Mild intermittent asthma, uncomplicated: Secondary | ICD-10-CM

## 2015-08-24 DIAGNOSIS — E559 Vitamin D deficiency, unspecified: Secondary | ICD-10-CM | POA: Diagnosis not present

## 2015-08-24 DIAGNOSIS — H9190 Unspecified hearing loss, unspecified ear: Secondary | ICD-10-CM | POA: Insufficient documentation

## 2015-08-24 DIAGNOSIS — E785 Hyperlipidemia, unspecified: Secondary | ICD-10-CM

## 2015-08-24 MED ORDER — METFORMIN HCL 500 MG PO TABS: 500.0000 mg | ORAL_TABLET | Freq: Two times a day (BID) | ORAL | Status: DC

## 2015-08-24 MED ORDER — ALBUTEROL SULFATE 0.63 MG/3ML IN NEBU: 1.0000 | INHALATION_SOLUTION | Freq: Four times a day (QID) | RESPIRATORY_TRACT | Status: DC | PRN

## 2015-08-24 MED ORDER — BUDESONIDE 0.5 MG/2ML IN SUSP: 0.5000 mg | Freq: Two times a day (BID) | RESPIRATORY_TRACT | Status: DC

## 2015-08-24 MED ORDER — ALBUTEROL SULFATE HFA 108 (90 BASE) MCG/ACT IN AERS: 2.0000 | INHALATION_SPRAY | RESPIRATORY_TRACT | Status: DC | PRN

## 2015-08-24 MED ORDER — ENALAPRIL MALEATE 10 MG PO TABS: 10.0000 mg | ORAL_TABLET | Freq: Every day | ORAL | Status: DC

## 2015-08-24 MED ORDER — BLOOD GLUCOSE MONITOR KIT: PACK | Status: DC

## 2015-08-24 NOTE — Addendum Note (Signed)
Addended by: Steele Sizer F on: 08/24/2015 03:37 PM   Modules accepted: Orders, SmartSet

## 2015-08-24 NOTE — Telephone Encounter (Signed)
Patient also called asking about Glucose Meters and Strips. If you could please send a prescription into her pharmacy. Thanks

## 2015-08-24 NOTE — Patient Instructions (Signed)
Polymyalgia Rheumatica Polymyalgia rheumatica (also called PMR or polymyalgia) is a rheumatologic (arthritic) condition that causes pain and morning stiffness in your neck, shoulders, and hips. It is an inflammatory condition. In some people, inflammation of certain structures in the shoulder, hips, or other joints can be seen on special testing. It does not cause joint destruction, as occurs in other arthritic conditions. It usually occurs after 68 years of age, and is more common as you age. It can be confused with several other diseases, but it is usually easily treated. People with PMR often have, or can develop, a more severe rheumatologic condition called giant cell arteritis (also called CGA or temporal arteritis).  CAUSES  The exact cause of PMR is not known.   There are genetic factors involved.  Viruses have been suspected in the cause of PMR. This has not been proven. SYMPTOMS   Aching, pain, and morning stiffness your neck, both shoulders, or both hips.  Symptoms usually start slowly and build gradually.  Morning stiffness usually lasts at least 30 minutes.  Swelling and tenderness in other joints of the arms, hands, legs, and feet may occur.  Swelling and inflammation in the wrists can cause nerve inflammation at the wrist (carpal tunnel syndrome).  You may also have low grade fever, fatigue, weakness, decreased appetite and weight loss. DIAGNOSIS   Your caregiver may suspect that you have PMR based on your description of your symptoms and on your exam.  Your caregiver will examine you to be sure you do not have diseases that can be confused with PMR. These diseases include rheumatoid arthritis, fibromyalgia, or thyroid disease.  Your caregiver should check for signs of giant cell arteritis. This can cause serious complications such as blindness.  Lab tests can help confirm that you have PMR and not other diseases, but are sometimes inconclusive.  X-rays cannot show PMR.  However, it can identify other diseases like rheumatoid arthritis. Your caregiver may have you see a specialist in arthritis and inflammatory diseases (rheumatologist). TREATMENT  The goal of treatment is relief of symptoms. Treatment does not shorten the course of the illness or prevent complications. With proper treatment, you usually feel better almost right away.   The initial treatment of PMR is usually a cortisone (steroid) medication. Your caregiver will help determine a starting dose. The dose is gradually reduced every few weeks to months. Treatment usually lasts one to three years.  Other stronger medications are rarely needed. They will only be prescribed if your symptoms do not get better on cortisone medication alone, or if they recur as the dose is reduced.  Cortisone medication can have different side effects. With the doses of cortisone needed for PMR, the side effects can affect bones and joints, blood sugar control in diabetes, and mood changes. Discuss this with your caregiver.  Your caregiver will evaluate you regularly during your treatment. They will do this in order to assess progress and to check for complications of the illness or treatment.  Physical therapy is sometimes useful. This is especially true if your joints are still stiff after other symptoms have improved. HOME CARE INSTRUCTIONS   Follow your caregiver's instructions. Do not change your dose of cortisone medication on your own.  Keep your appointments for follow-up lab tests and caregiver visits. Your lab tests need to be monitored. You must get checked periodically for giant cell arteritis.  Follow your caregiver's guidance regarding physical activity (usually no restrictions are needed) or physical therapy.  Your caregiver  may have instructions to prevent or check for side effects from cortisone medication (including bone density testing or treatment). Follow their instructions carefully. SEEK MEDICAL  CARE IF:   You develop any side effects from treatment. Side effects can include:  Elevated blood pressure.  High blood sugar (or worsening of diabetes, if you are diabetic).  Difficulty fighting off infections.  Weight gain.  Weakness of the bones (osteoporosis).  Your aches, pains, morning stiffness, or other symptoms get worse with time. This is especially true after your dose of cortisone is reduced.  You develop new joint symptoms (pain, swelling, etc.) SEEK IMMEDIATE MEDICAL CARE IF:   You develop a severe headache.  You start vomiting.  You have problems with your vision.  You have an oral temperature above 102 F (38.9 C), not controlled by medicine. Document Released: 01/02/2005 Document Revised: 11/11/2012 Document Reviewed: 04/17/2009 ExitCare Patient Information 2015 [employer]. This information is not intended to replace advice given to you by your health care provider. Make sure you discuss any questions you have with your health care provider.  

## 2015-08-24 NOTE — Telephone Encounter (Signed)
Patient left without scheduling the 6 month fu visit. However she already has her annual cpe in november

## 2015-08-24 NOTE — Progress Notes (Addendum)
Name: Bailey [name]   MRN: [id number]    DOB: 1946/03/02   Date:08/24/2015       Progress Note  Subjective  Chief Complaint  Chief Complaint  Patient presents with  . Medication Refill    3 month F/U  . Diabetes    Has not been checking sugar at home. Run out of strips and needs meter.  . Asthma    Well Controlled-needs paperwork for [facility]  . Hypertension    Enalapril-takes one at night. Well Controlled    HPI  DMII : she has been taking Metformin, denies side effects of medication. Denies polyphagia, polyuria or polydipsia. Taking aspirin, and ARB.   Asthma Mild Intermittent: she is using Pulmicort prn, albuterol occasionally, denies symptoms at this time  HTN: taking medication and denies side effects of medication, bp is at goal  Obesity: she lost 10 lbs since last visit, she is very stressed, not happy at work, also lost a young friend that called her grammy  Dyslipidemia: taking medication and denies side effects of medication   Fatigue: she has noticed significant fatigue over the past 6 weeks, sudden onset, tired, no energy, lots of stress at work, now also crying because she lost a friend ( shot  ) three weeks ago  Hearing loss: seen by ENT, needs hearing aid but can't afford it at this time  Arthralgia: she has noticed acute onset of joint aches and extreme fatigue in July, not getting better, no headaches, no temporal tenderness, weight loss   Patient Active Problem List   Diagnosis Date Noted  . Asthma, mild intermittent 08/23/2015  . Arthritis, degenerative 08/23/2015  . Dyslipidemia 08/23/2015  . H/O: hypothyroidism 08/23/2015  . Obesity (BMI 30-39.9) 08/23/2015  . HZV (herpes zoster virus) post herpetic neuralgia 08/23/2015  . History of renal stone 04/01/2014  . Diabetes mellitus, controlled 04/01/2014  . Essential hypertension, benign 04/01/2014  . H/O prosthetic heart valve 06/15/2012  . CD (contact dermatitis) 07/25/2008    Past Surgical  History  Procedure Laterality Date  . Aortic valve replacement  06/2012   [facility]  . Colonoscopy  05-09-2014  . Appendectomy  1958  . Cholecystectomy  1978  . Tonsillectomy  1953  . Extracorporeal shock wave lithotripsy Right 06/ 2015     [place]  . Cystoscopy/retrograde/ureteroscopy Right 07/04/2014    Procedure: CYSTOSCOPY/RETROGRADE/URETEROSCOPY;  Surgeon: Bernestine Amass, MD;  [facility];  Service: Urology;  Laterality: Right;  . Holmium laser application Right 5/78/4696    Procedure: HOLMIUM LASER APPLICATION;  Surgeon: Bernestine Amass, MD;  Location: [facility];  Service: Urology;  Laterality: Right;    Family History  Problem Relation Age of Onset  . Colon cancer Neg Hx   . Asthma Daughter   . Asthma Maternal Grandfather   . Heart disease Sister   . Heart disease Mother   . Heart disease Father     Social History   Social History  . Marital Status: Divorced    Spouse Name: N/A  . Number of Children: N/A  . Years of Education: N/A   Occupational History  . Not on file.   Social History Main Topics  . Smoking status: Never Smoker   . Smokeless tobacco: Never Used  . Alcohol Use: No  . Drug Use: No  . Sexual Activity: Not Currently   Other Topics Concern  . Not on file   Social History Narrative   Divorced, 2 children. Works as  a dispatcher. Drinks 2-3 caffeinated beverages/day     Current outpatient prescriptions:  .  albuterol (PROAIR HFA) 108 (90 BASE) MCG/ACT inhaler, Inhale 2 puffs into the lungs every 4 (four) hours as needed for shortness of breath., Disp: 1 Inhaler, Rfl: 2 .  aspirin 81 MG tablet, Take 81 mg by mouth daily., Disp: , Rfl:  .  Astaxanthin 4 MG CAPS, Take by mouth 2 (two) times daily., Disp: , Rfl:  .  blood glucose meter kit and supplies, , Disp: , Rfl:  .  budesonide (PULMICORT) 0.5 MG/2ML nebulizer solution, Inhale 2 mLs (0.5 mg total) into the lungs 2 (two) times daily., Disp: 240 mL, Rfl: 4 .   Cholecalciferol (VITAMIN D) 2000 UNITS tablet, Take 2,000 Units by mouth daily.  , Disp: , Rfl:  .  Coenzyme Q10 (CO Q-10) 100 MG CAPS, Take 1 capsule by mouth daily. , Disp: , Rfl:  .  enalapril (VASOTEC) 10 MG tablet, Take 1 tablet (10 mg total) by mouth daily., Disp: 90 tablet, Rfl: 1 .  magnesium oxide (MAG-OX) 400 MG tablet, Take by mouth., Disp: , Rfl:  .  Menaquinone-7 (VITAMIN K2) 40 MCG TABS, Take 1 tablet by mouth daily., Disp: , Rfl:  .  metFORMIN (GLUCOPHAGE) 500 MG tablet, Take 1 tablet (500 mg total) by mouth 2 (two) times daily with a meal., Disp: 180 tablet, Rfl: 1 .  MULTIPLE VITAMIN PO, , Disp: , Rfl:  .  Omega-3 Fatty Acids (FISH OIL) 1000 MG CAPS, Take 1 capsule by mouth daily. UAD, Disp: , Rfl:  .  Potassium Citrate 15 MEQ (1620 MG) TBCR, Take 2 tablets by mouth 2 (two) times daily. , Disp: , Rfl:  .  Probiotic Product (PROBIOTIC DAILY PO), Take 1 capsule by mouth daily., Disp: , Rfl:  .  TURMERIC CURCUMIN PO, Take 1 tablet by mouth every morning. , Disp: , Rfl:   Allergies  Allergen Reactions  . Cyanocobalamin Other (See Comments)    Other reaction(s): Other (See Comments) Sublingual-unconcious  . Dilaudid [Hydromorphone Hcl] Other (See Comments)    Severe hypotension  . Naproxen Shortness Of Breath  . Percocet [Oxycodone-Acetaminophen] Shortness Of Breath    Can tolerate oxy IR  . Latex Hives  . Aspirin Other (See Comments)    Nose bleeds at higher doses  . Nsaids Other (See Comments)    Causes asthma  . Rosuvastatin      ROS  Constitutional: Negative for fever positive for  weight change.  Respiratory: Negative for cough and shortness of breath.   Cardiovascular: Negative for chest pain or palpitations.  Gastrointestinal: Negative for abdominal pain, no bowel changes.  Musculoskeletal: Negative for gait problem or joint swelling.  Skin: Negative for rash.  Neurological: Negative for dizziness or headache.  No other specific complaints in a complete  review of systems (except as listed in HPI above).  Objective  Filed Vitals:   08/24/15 0919  BP: 124/84  Pulse: 80  Temp: 98.1 F (36.7 C)  TempSrc: Oral  Resp: 18  Height: 5' 2"  (1.575 m)  Weight: 210 lb 1.6 oz (95.301 kg)  SpO2: 97%    Body mass index is 38.42 kg/(m^2).  Physical Exam  Constitutional: Patient appears well-developed and well-nourished. Obese  No distress.  HEENT: head atraumatic, normocephalic, pupils equal and reactive to light, neck supple, throat within normal limits Cardiovascular: Normal rate, regular rhythm and normal heart sounds.  No murmur heard. No BLE edema. Pulmonary/Chest: Effort normal and breath sounds  normal. No respiratory distress. Abdominal: Soft.  There is no tenderness. Psychiatric: Patient has a normal mood and affect. behavior is normal. Judgment and thought content normal. Muscular Skeletal: no crepitus with extension of knees, right wrist pain but no synovitis   Diabetic Foot Exam: Diabetic Foot Exam - Simple   Simple Foot Form  Visual Inspection  No deformities, no ulcerations, no other skin breakdown bilaterally:  Yes  Sensation Testing  Intact to touch and monofilament testing bilaterally:  Yes  Pulse Check  Posterior Tibialis and Dorsalis pulse intact bilaterally:  Yes  Comments       PHQ2/9: Depression screen [facility] 2/9 08/24/2015 07/13/2012  Decreased Interest 0 0  Down, Depressed, Hopeless 0 0  PHQ - 2 Score 0 0     Fall Risk: Fall Risk  08/24/2015  Falls in the past year? No     Functional Status Survey: Is the patient deaf or have difficulty hearing?: No (Has a appt to go for hearing aids soon) Does the patient have difficulty seeing, even when wearing glasses/contacts?: Yes (glasses) Does the patient have difficulty concentrating, remembering, or making decisions?: No Does the patient have difficulty walking or climbing stairs?: No Does the patient have difficulty dressing or bathing?: No Does the patient  have difficulty doing errands alone such as visiting a doctor's office or shopping?: No   Assessment & Plan  1. Diabetes mellitus, controlled  - Hemoglobin A1c - metFORMIN (GLUCOPHAGE) 500 MG tablet; Take 1 tablet (500 mg total) by mouth 2 (two) times daily with a meal.  Dispense: 180 tablet; Refill: 1  2. Essential hypertension, benign  - Comprehensive metabolic panel - enalapril (VASOTEC) 10 MG tablet; Take 1 tablet (10 mg total) by mouth daily.  Dispense: 90 tablet; Refill: 1  3. Obesity (BMI 30-39.9) Losing weight, discussed diet and exercise  4. Dyslipidemia  - Lipid panel  5. Asthma, mild intermittent, uncomplicated  - budesonide (PULMICORT) 0.5 MG/2ML nebulizer solution; Inhale 2 mLs (0.5 mg total) into the lungs 2 (two) times daily.  Dispense: 240 mL; Refill: 4 - albuterol (PROAIR HFA) 108 (90 BASE) MCG/ACT inhaler; Inhale 2 puffs into the lungs every 4 (four) hours as needed for shortness of breath.  Dispense: 1 Inhaler; Refill: 2  6. Other fatigue Check labs, discussed sleep study, but she refuses at this time - TSH - Vitamin B12 - Vit D  25 hydroxy (rtn osteoporosis monitoring)  7. Needs flu shot  - Flu vaccine HIGH DOSE PF  8. Need for pneumococcal vaccination   Pneumococcal polysaccharide vaccine 23-valent greater than or equal to 2yo subcutaneous/IM  9. Breast cancer screening  She states her breast is very dense,  And would like MRI, we will check mammogram first - MM Digital Screening; Future  11. Arthralgia Possible polymyalgia rheumatica - C-reactive protein - Sedimentation rate

## 2015-08-24 NOTE — Telephone Encounter (Signed)
Could you please schedule patient a 6 month F/U for DM from now. Thanks

## 2015-08-25 LAB — C-REACTIVE PROTEIN: CRP: 8.2 mg/L — AB (ref 0.0–4.9)

## 2015-08-25 LAB — COMPREHENSIVE METABOLIC PANEL
ALT: 16 IU/L (ref 0–32)
AST: 18 IU/L (ref 0–40)
Albumin/Globulin Ratio: 1.9 (ref 1.1–2.5)
Albumin: 4.5 g/dL (ref 3.6–4.8)
Alkaline Phosphatase: 44 IU/L (ref 39–117)
BUN/Creatinine Ratio: 21 (ref 11–26)
BUN: 16 mg/dL (ref 8–27)
Bilirubin Total: 0.4 mg/dL (ref 0.0–1.2)
CALCIUM: 10 mg/dL (ref 8.7–10.3)
CO2: 26 mmol/L (ref 18–29)
Chloride: 99 mmol/L (ref 97–108)
Creatinine, Ser: 0.77 mg/dL (ref 0.57–1.00)
GFR calc Af Amer: 92 mL/min/{1.73_m2} (ref >59)
GFR, EST NON AFRICAN AMERICAN: 80 mL/min/{1.73_m2} (ref >59)
Globulin, Total: 2.4 g/dL (ref 1.5–4.5)
Glucose: 118 mg/dL — ABNORMAL HIGH (ref 65–99)
POTASSIUM: 4.7 mmol/L (ref 3.5–5.2)
Sodium: 142 mmol/L (ref 134–144)
TOTAL PROTEIN: 6.9 g/dL (ref 6.0–8.5)

## 2015-08-25 LAB — LIPID PANEL
Chol/HDL Ratio: 5 ratio units — ABNORMAL HIGH (ref 0.0–4.4)
Cholesterol, Total: 225 mg/dL — ABNORMAL HIGH (ref 100–199)
HDL: 45 mg/dL (ref >39)
LDL Calculated: 149 mg/dL — ABNORMAL HIGH (ref 0–99)
TRIGLYCERIDES: 157 mg/dL — AB (ref 0–149)
VLDL CHOLESTEROL CAL: 31 mg/dL (ref 5–40)

## 2015-08-25 LAB — HEMOGLOBIN A1C
ESTIMATED AVERAGE GLUCOSE: 131 mg/dL
HEMOGLOBIN A1C: 6.2 % — AB (ref 4.8–5.6)

## 2015-08-25 LAB — TSH: TSH: 1.63 u[IU]/mL (ref 0.450–4.500)

## 2015-08-25 LAB — VITAMIN D 25 HYDROXY (VIT D DEFICIENCY, FRACTURES): Vit D, 25-Hydroxy: 45.3 ng/mL (ref 30.0–100.0)

## 2015-08-25 LAB — VITAMIN B12: Vitamin B-12: >2000 pg/mL — ABNORMAL HIGH (ref 211–946)

## 2015-08-25 LAB — SEDIMENTATION RATE: Sed Rate: 9 mm/hr (ref 0–40)

## 2015-08-26 ENCOUNTER — Other Ambulatory Visit: Payer: Self-pay | Admitting: Family Medicine

## 2015-08-26 MED ORDER — PREDNISONE 5 MG PO TABS: 10.0000 mg | ORAL_TABLET | Freq: Three times a day (TID) | ORAL | Status: DC

## 2015-08-26 MED ORDER — PREDNISONE 5 MG PO TABS: 5.0000 mg | ORAL_TABLET | Freq: Three times a day (TID) | ORAL | Status: DC

## 2015-08-30 ENCOUNTER — Other Ambulatory Visit: Payer: Self-pay | Admitting: Family Medicine

## 2015-08-30 NOTE — Telephone Encounter (Signed)
Patient requesting refill. 

## 2015-09-12 ENCOUNTER — Telehealth: Payer: Self-pay | Admitting: Family Medicine

## 2015-09-12 DIAGNOSIS — M353 Polymyalgia rheumatica: Secondary | ICD-10-CM | POA: Insufficient documentation

## 2015-09-12 NOTE — Telephone Encounter (Signed)
Requesting a referral to [place] with Dr. Bo Merino. Please fax all information including why she is needing this appointment to fax [phone]

## 2015-09-12 NOTE — Telephone Encounter (Signed)
Needs referral to Dr. Olegario Messier for Rheuamtologist

## 2015-09-21 ENCOUNTER — Ambulatory Visit: Payer: Self-pay | Admitting: Family Medicine

## 2015-09-28 ENCOUNTER — Telehealth: Payer: Self-pay

## 2015-09-28 ENCOUNTER — Ambulatory Visit: Payer: Self-pay | Admitting: Family Medicine

## 2015-09-28 NOTE — Telephone Encounter (Signed)
Please call Bailey [name] in reference to a bill. Thanks

## 2015-10-19 ENCOUNTER — Encounter: Payer: Self-pay | Admitting: Family Medicine

## 2015-10-19 DIAGNOSIS — D225 Melanocytic nevi of trunk: Secondary | ICD-10-CM | POA: Diagnosis not present

## 2015-10-19 DIAGNOSIS — L821 Other seborrheic keratosis: Secondary | ICD-10-CM | POA: Diagnosis not present

## 2015-10-19 DIAGNOSIS — L918 Other hypertrophic disorders of the skin: Secondary | ICD-10-CM | POA: Diagnosis not present

## 2015-10-19 DIAGNOSIS — H02826 Cysts of left eye, unspecified eyelid: Secondary | ICD-10-CM | POA: Diagnosis not present

## 2015-10-26 ENCOUNTER — Telehealth: Payer: Self-pay | Admitting: Family Medicine

## 2015-10-26 ENCOUNTER — Encounter: Payer: Self-pay | Admitting: Family Medicine

## 2015-10-26 DIAGNOSIS — B029 Zoster without complications: Secondary | ICD-10-CM | POA: Diagnosis not present

## 2015-10-26 NOTE — Telephone Encounter (Signed)
PT SAID THAT SHE JUST WANTED THE DR TO KNOW THAT [facility] HAS DIAG HER WITH SHINGLES FOR THE 3RD TIME AND THEY GAVE HER VALTREX 1GRAM. IS ON HER FACE AND EAR

## 2015-11-06 ENCOUNTER — Encounter: Payer: Self-pay | Admitting: Family Medicine

## 2015-11-06 DIAGNOSIS — Z8639 Personal history of other endocrine, nutritional and metabolic disease: Secondary | ICD-10-CM | POA: Insufficient documentation

## 2015-11-09 DIAGNOSIS — M2550 Pain in unspecified joint: Secondary | ICD-10-CM | POA: Diagnosis not present

## 2015-11-09 DIAGNOSIS — M064 Inflammatory polyarthropathy: Secondary | ICD-10-CM | POA: Diagnosis not present

## 2015-11-09 DIAGNOSIS — M79641 Pain in right hand: Secondary | ICD-10-CM | POA: Diagnosis not present

## 2015-11-09 DIAGNOSIS — R5383 Other fatigue: Secondary | ICD-10-CM | POA: Diagnosis not present

## 2015-11-09 DIAGNOSIS — M154 Erosive (osteo)arthritis: Secondary | ICD-10-CM | POA: Diagnosis not present

## 2015-11-09 DIAGNOSIS — M79672 Pain in left foot: Secondary | ICD-10-CM | POA: Diagnosis not present

## 2015-11-09 DIAGNOSIS — M79642 Pain in left hand: Secondary | ICD-10-CM | POA: Diagnosis not present

## 2015-11-09 DIAGNOSIS — M79671 Pain in right foot: Secondary | ICD-10-CM | POA: Diagnosis not present

## 2015-11-12 ENCOUNTER — Encounter: Payer: Self-pay | Admitting: Family Medicine

## 2015-11-12 DIAGNOSIS — M154 Erosive (osteo)arthritis: Secondary | ICD-10-CM | POA: Insufficient documentation

## 2015-11-12 DIAGNOSIS — M064 Inflammatory polyarthropathy: Secondary | ICD-10-CM | POA: Insufficient documentation

## 2015-12-05 ENCOUNTER — Telehealth: Payer: Self-pay | Admitting: Family Medicine

## 2015-12-05 DIAGNOSIS — J4520 Mild intermittent asthma, uncomplicated: Secondary | ICD-10-CM

## 2015-12-05 NOTE — Telephone Encounter (Signed)
Pt needs inhaler to be called into [facility] in [address]. Pt states she needs this ASAP

## 2015-12-06 ENCOUNTER — Telehealth: Payer: Self-pay | Admitting: Family Medicine

## 2015-12-06 MED ORDER — ALBUTEROL SULFATE HFA 108 (90 BASE) MCG/ACT IN AERS: 2.0000 | INHALATION_SPRAY | RESPIRATORY_TRACT | Status: DC | PRN

## 2015-12-06 NOTE — Telephone Encounter (Signed)
Please change to what the patient was using

## 2015-12-06 NOTE — Telephone Encounter (Signed)
Bailey [name] from [place] for [place] states that a medication list was faxed to her. On the list the albuterol for the nebulizer machine strength is 2.5 mg but the prescription says .63 mg. Would like to know if she can change it to the 2.5  [phone]

## 2015-12-06 NOTE — Telephone Encounter (Signed)
Done, please make sure she has an asthma follow up scheduled

## 2015-12-07 ENCOUNTER — Other Ambulatory Visit: Payer: Self-pay

## 2015-12-07 DIAGNOSIS — J4520 Mild intermittent asthma, uncomplicated: Secondary | ICD-10-CM

## 2015-12-07 MED ORDER — ALBUTEROL SULFATE (2.5 MG/3ML) 0.083% IN NEBU: 2.5000 mg | INHALATION_SOLUTION | RESPIRATORY_TRACT | Status: DC | PRN

## 2015-12-07 NOTE — Telephone Encounter (Signed)
CORRECTION: [facility] only have the 0.083% (which is the 2.53) and they would like to know if they can change the dosage to 2.53 for the albuterol

## 2015-12-07 NOTE — Telephone Encounter (Signed)
Patient notified and changed dose in our system for [place] per Dr. Ancil Boozer

## 2016-01-18 DIAGNOSIS — M154 Erosive (osteo)arthritis: Secondary | ICD-10-CM | POA: Diagnosis not present

## 2016-01-18 DIAGNOSIS — M064 Inflammatory polyarthropathy: Secondary | ICD-10-CM | POA: Diagnosis not present

## 2016-01-18 DIAGNOSIS — M2550 Pain in unspecified joint: Secondary | ICD-10-CM | POA: Diagnosis not present

## 2016-01-18 DIAGNOSIS — R5383 Other fatigue: Secondary | ICD-10-CM | POA: Diagnosis not present

## 2016-04-02 ENCOUNTER — Other Ambulatory Visit: Payer: Self-pay | Admitting: Family Medicine

## 2016-04-03 NOTE — Telephone Encounter (Signed)
Left voice message informing patient that prescription has been sent to pharmacy but we are also needing to schedule appointment.

## 2016-06-09 ENCOUNTER — Other Ambulatory Visit: Payer: Self-pay | Admitting: Family Medicine

## 2016-06-10 NOTE — Telephone Encounter (Signed)
Patient scheduled appointment for 07-24-16

## 2016-07-11 ENCOUNTER — Other Ambulatory Visit: Payer: Self-pay | Admitting: Family Medicine

## 2016-07-24 ENCOUNTER — Ambulatory Visit: Payer: Self-pay | Admitting: Family Medicine

## 2016-08-11 ENCOUNTER — Other Ambulatory Visit: Payer: Self-pay | Admitting: Family Medicine

## 2016-08-13 NOTE — Telephone Encounter (Signed)
Patient already have appt for 08/21/16

## 2016-08-15 ENCOUNTER — Other Ambulatory Visit: Payer: Self-pay

## 2016-08-15 MED ORDER — ENALAPRIL MALEATE 10 MG PO TABS: 10.0000 mg | ORAL_TABLET | Freq: Every day | ORAL | 0 refills | Status: DC

## 2016-08-15 NOTE — Telephone Encounter (Signed)
Patient requesting refill of Enalapril 10 mg. Per Dr. Ancil Boozer can send it in 1 refill and patient needs a appointment to be seen, since she has missed her last 2 appointments.

## 2016-08-21 ENCOUNTER — Ambulatory Visit (INDEPENDENT_AMBULATORY_CARE_PROVIDER_SITE_OTHER): Payer: Medicare Other | Admitting: Family Medicine

## 2016-08-21 ENCOUNTER — Encounter: Payer: Self-pay | Admitting: *Deleted

## 2016-08-21 ENCOUNTER — Encounter: Payer: Self-pay | Admitting: Family Medicine

## 2016-08-21 ENCOUNTER — Ambulatory Visit (HOSPITAL_COMMUNITY): Admission: RE | Admit: 2016-08-21 | Payer: Medicare Other | Source: Ambulatory Visit

## 2016-08-21 VITALS — BP 114/72 | HR 85 | Temp 98.1°F | Resp 16 | Ht 62.0 in | Wt 204.8 lb

## 2016-08-21 DIAGNOSIS — H53131 Sudden visual loss, right eye: Secondary | ICD-10-CM

## 2016-08-21 DIAGNOSIS — M353 Polymyalgia rheumatica: Secondary | ICD-10-CM | POA: Diagnosis not present

## 2016-08-21 DIAGNOSIS — R51 Headache: Secondary | ICD-10-CM

## 2016-08-21 DIAGNOSIS — I10 Essential (primary) hypertension: Secondary | ICD-10-CM | POA: Diagnosis not present

## 2016-08-21 DIAGNOSIS — E785 Hyperlipidemia, unspecified: Secondary | ICD-10-CM

## 2016-08-21 DIAGNOSIS — E1169 Type 2 diabetes mellitus with other specified complication: Secondary | ICD-10-CM | POA: Diagnosis not present

## 2016-08-21 DIAGNOSIS — Z23 Encounter for immunization: Secondary | ICD-10-CM

## 2016-08-21 DIAGNOSIS — R519 Headache, unspecified: Secondary | ICD-10-CM

## 2016-08-21 DIAGNOSIS — J4520 Mild intermittent asthma, uncomplicated: Secondary | ICD-10-CM | POA: Diagnosis not present

## 2016-08-21 LAB — POCT UA - MICROALBUMIN: Microalbumin Ur, POC: NEGATIVE mg/L

## 2016-08-21 LAB — POCT GLYCOSYLATED HEMOGLOBIN (HGB A1C): HEMOGLOBIN A1C: 6

## 2016-08-21 MED ORDER — PREDNISONE 20 MG PO TABS: 20.0000 mg | ORAL_TABLET | Freq: Every day | ORAL | 0 refills | Status: DC

## 2016-08-21 MED ORDER — ENALAPRIL MALEATE 10 MG PO TABS: 10.0000 mg | ORAL_TABLET | Freq: Every day | ORAL | 1 refills | Status: DC

## 2016-08-21 MED ORDER — METFORMIN HCL 500 MG PO TABS: 500.0000 mg | ORAL_TABLET | Freq: Two times a day (BID) | ORAL | 1 refills | Status: DC

## 2016-08-21 NOTE — Progress Notes (Signed)
Name: Bailey [name]   MRN: [id number]    DOB: [birth year]   Date:08/21/2016       Progress Note  Subjective  Chief Complaint  Chief Complaint  Patient presents with  . Medication Refill  . Hypertension    Does not check bp at home but states she has a headache and dizziness.   . Diabetes    Checks sugar once a daily, L-98 Average inbetween 98-106 High-106    HPI  DMII : she has been taking Metformin, denies side effects of medication. Denies polyphagia, polyuria or polydipsia. Taking aspirin, and ARB. She is taking low dose prednisone because of polymyalgia rheumatica. She has dyslipidemia and is not on statin therapy because of side effects, she tries to eat healthy  Asthma Mild Intermittent: she is using Pulmicort prn,not recently  albuterol occasionally, denies symptoms at this time, no wheezing, cough or SOB at this time, worse during Spring  HTN: taking medication and denies side effects of medication, bp is at goal  Obesity: she lost 5 lbs since last visit, she states she gained some over the past week because of her stress level. A friend of hers was diagnosed with breast cancer  Dyslipidemia: unable to tolerate statin therapy   Hearing loss: seen by ENT, needs hearing aid but can't afford it at this time  Polymyalgia Rheumatica: seen by Rheumatologist and is currently on low dose prednisone, but still has joint aches all over.   Headache/right vision loss: headaches started a couple of months ago, pain is described as sharp pain on right temporal area a couple of times a week, last week the headache was associated with vision loss on right side that lasted about one minute, she also states she had one episode that she felt like her eyes were moving and she had to pull over while driving that same week. She does not have a history of headaches in the past   Kidney stone: she recently passed a kidney stone from right side.   Patient Active Problem List   Diagnosis  Date Noted  . Inflammatory polyarthritis ([place]) 11/12/2015  . Erosive osteoarthritis of hand 11/12/2015  . History of vitamin D deficiency 11/06/2015  . Hearing loss 08/24/2015  . Asthma, mild intermittent 08/23/2015  . Arthritis, degenerative 08/23/2015  . Dyslipidemia 08/23/2015  . H/O: hypothyroidism 08/23/2015  . Obesity (BMI 30-39.9) 08/23/2015  . HZV (herpes zoster virus) post herpetic neuralgia 08/23/2015  . History of renal stone 04/01/2014  . Diabetes mellitus, controlled ([place]) 04/01/2014  . Essential hypertension, benign 04/01/2014  . H/O prosthetic heart valve 06/15/2012  . CD (contact dermatitis) 07/25/2008    Past Surgical History:  Procedure Laterality Date  . AORTIC VALVE REPLACEMENT  06/2012   [facility]  . APPENDECTOMY  1958  . CHOLECYSTECTOMY  1978  . COLONOSCOPY  05-09-2014  . CYSTOSCOPY/RETROGRADE/URETEROSCOPY Right 07/04/2014   Procedure: CYSTOSCOPY/RETROGRADE/URETEROSCOPY;  Surgeon: Bernestine Amass, MD;  Location: [facility];  Service: Urology;  Laterality: Right;  . EXTRACORPOREAL SHOCK WAVE LITHOTRIPSY Right 06/ 2015     [facility]  . HOLMIUM LASER APPLICATION Right 1/91/4782   Procedure: HOLMIUM LASER APPLICATION;  Surgeon: Bernestine Amass, MD;  Location: [facility];  Service: Urology;  Laterality: Right;  . TONSILLECTOMY  1953    Family History  Problem Relation Age of Onset  . Colon cancer Neg Hx   . Asthma Daughter   . Asthma Maternal Grandfather   . Heart disease Sister   .  Heart disease Mother   . Heart disease Father     Social History   Social History  . Marital status: Divorced    Spouse name: N/A  . Number of children: N/A  . Years of education: N/A   Occupational History  . Not on file.   Social History Main Topics  . Smoking status: Never Smoker  . Smokeless tobacco: Never Used  . Alcohol use No  . Drug use: No  . Sexual activity: Not Currently   Other Topics Concern  . Not on file   Social History  Narrative   Divorced, 2 children. Works as a Counsellor. Drinks 2-3 caffeinated beverages/day     Current Outpatient Prescriptions:  .  albuterol (PROAIR HFA) 108 (90 Base) MCG/ACT inhaler, Inhale 2 puffs into the lungs every 4 (four) hours as needed for shortness of breath., Disp: 1 Inhaler, Rfl: 2 .  aspirin 81 MG tablet, Take 81 mg by mouth daily., Disp: , Rfl:  .  Astaxanthin 4 MG CAPS, Take by mouth 2 (two) times daily., Disp: , Rfl:  .  blood glucose meter kit and supplies KIT, Dispense based on patient and insurance preference. Use up to four times daily as directed. (FOR ICD-9 250.00, 250.01)., Disp: 1 each, Rfl: 0 .  blood glucose meter kit and supplies, , Disp: , Rfl:  .  budesonide (PULMICORT) 0.5 MG/2ML nebulizer solution, Inhale 2 mLs (0.5 mg total) into the lungs 2 (two) times daily., Disp: 240 mL, Rfl: 4 .  Cholecalciferol (VITAMIN D) 2000 UNITS tablet, Take 2,000 Units by mouth daily.  , Disp: , Rfl:  .  Coenzyme Q10 (CO Q-10) 100 MG CAPS, Take 1 capsule by mouth daily. , Disp: , Rfl:  .  enalapril (VASOTEC) 10 MG tablet, Take 1 tablet (10 mg total) by mouth daily., Disp: 30 tablet, Rfl: 0 .  magnesium oxide (MAG-OX) 400 MG tablet, Take by mouth., Disp: , Rfl:  .  Menaquinone-7 (VITAMIN K2) 40 MCG TABS, Take 1 tablet by mouth daily., Disp: , Rfl:  .  metFORMIN (GLUCOPHAGE) 500 MG tablet, TAKE 1 TABLET BY MOUTH TWICE DAILY, Disp: 180 tablet, Rfl: 0 .  MULTIPLE VITAMIN PO, , Disp: , Rfl:  .  Omega-3 Fatty Acids (FISH OIL) 1000 MG CAPS, Take 1 capsule by mouth daily. UAD, Disp: , Rfl:  .  predniSONE (DELTASONE) 1 MG tablet, Take 1 mg by mouth at bedtime., Disp: , Rfl:  .  Probiotic Product (PROBIOTIC DAILY PO), Take 1 capsule by mouth daily., Disp: , Rfl:  .  TURMERIC CURCUMIN PO, Take 1 tablet by mouth every morning. , Disp: , Rfl:   Allergies  Allergen Reactions  . Cyanocobalamin Other (See Comments)    Other reaction(s): Other (See Comments) Sublingual-unconcious  .  Dilaudid [Hydromorphone Hcl] Other (See Comments)    Severe hypotension  . Naproxen Shortness Of Breath  . Percocet [Oxycodone-Acetaminophen] Shortness Of Breath    Can tolerate oxy IR  . Latex Hives  . Aspirin Other (See Comments)    Nose bleeds at higher doses  . Nsaids Other (See Comments)    Causes asthma  . Other Swelling    Eye drop preservative  . Rosuvastatin      ROS  Constitutional: Negative for fever or weight change.  Respiratory: Negative for cough and shortness of breath.   Cardiovascular: Negative for chest pain or palpitations.  Gastrointestinal: Negative for abdominal pain, no bowel changes.  Musculoskeletal: Negative for gait problem or joint swelling.  Skin: Negative for rash.  Neurological: Negative for dizziness or headache.  No other specific complaints in a complete review of systems (except as listed in HPI above).  Objective  Vitals:   08/21/16 0837  BP: 114/72  Pulse: 85  Resp: 16  Temp: 98.1 F (36.7 C)  TempSrc: Oral  SpO2: 96%  Weight: 204 lb 12.8 oz (92.9 kg)  Height: _0  (1.575 m)    Body mass index is 37.46 kg/m.  Physical Exam  Constitutional: Patient appears well-developed and well-nourished. Obese  No distress.  HEENT: head atraumatic, normocephalic, pupils equal and reactive to light, ears normal TM bilaterally neck supple, throat within normal limits, no tenderness during palpation of right temporal area  Cardiovascular: Normal rate, regular rhythm and normal heart sounds.  SEM over 2nd intercostal space left side, s/p aortic valve replacement  No BLE edema. Pulmonary/Chest: Effort normal and breath sounds normal. No respiratory distress. Abdominal: Soft.  There is no tenderness. Psychiatric: Patient has a normal mood and affect. behavior is normal. Judgment and thought content normal. Neurological : no focal findings  Recent Results (from the past 2160 hour(s))  POCT HgB A1C     Status: Normal   Collection Time: 08/21/16   8:36 AM  Result Value Ref Range   Hemoglobin A1C 6.0   POCT UA - Microalbumin     Status: Normal   Collection Time: 08/21/16  8:36 AM  Result Value Ref Range   Microalbumin Ur, POC negative mg/L   Creatinine, POC  mg/dL   Albumin/Creatinine Ratio, Urine, POC      Diabetic Foot Exam: Diabetic Foot Exam - Simple   Simple Foot Form Diabetic Foot exam was performed with the following findings:  Yes 08/21/2016  9:40 AM  Visual Inspection No deformities, no ulcerations, no other skin breakdown bilaterally:  Yes Sensation Testing Intact to touch and monofilament testing bilaterally:  Yes Pulse Check Posterior Tibialis and Dorsalis pulse intact bilaterally:  Yes Comments      PHQ2/9: Depression screen [facility] 2/9 08/21/2016 08/24/2015 07/13/2012  Decreased Interest 0 0 0  Down, Depressed, Hopeless 0 0 0  PHQ - 2 Score 0 0 0     Fall Risk: Fall Risk  08/21/2016 08/24/2015  Falls in the past year? No No     Functional Status Survey: Is the patient deaf or have difficulty hearing?: No Does the patient have difficulty seeing, even when wearing glasses/contacts?: No Does the patient have difficulty concentrating, remembering, or making decisions?: No Does the patient have difficulty walking or climbing stairs?: No Does the patient have difficulty dressing or bathing?: No Does the patient have difficulty doing errands alone such as visiting a doctor's office or shopping?: No    Assessment & Plan  1. Dyslipidemia associated with type 2 diabetes mellitus ([place])  - POCT HgB A1C - POCT UA - Microalbumin  2. Needs flu shot  - Flu vaccine HIGH DOSE PF (Fluzone High dose) -refused  3. Vision, loss, sudden, right  Discussed possible risk of stroke/TIA or temporal arteritis, discussed with her Rheumatologist Dr. Posey Rea, who she has not seen since Feb 2017. We will arrange for temporal artery biopsy, start high dose prednisone and check MRI brain for further evaluation  - Sedimentation  rate - C-reactive protein - MR Brain Wo Contrast; Future - MR Angiogram Head Wo Contrast; Future - predniSONE (DELTASONE) 20 MG tablet; Take 1-2 tablets (20-40 mg total) by mouth daily with breakfast. 40 in am and 20 mg in the  pm  Dispense: 90 tablet; Refill: 0 - Ambulatory referral to General Surgery - Ambulatory referral to Rheumatology  4. Polymyalgia rheumatica syndrome (HCC)  - Sedimentation rate - C-reactive protein - Ambulatory referral to General Surgery - Ambulatory referral to Rheumatology  5. Asthma, mild intermittent, uncomplicated  stable  6. Essential hypertension, benign  - COMPLETE METABOLIC PANEL WITH GFR - CBC with Differential/Platelet  7. Dyslipidemia  - Lipid Profile  8. Right temporal headache  - Sedimentation rate - C-reactive protein - MR Brain Wo Contrast; Future - MR Angiogram Head Wo Contrast; Future - predniSONE (DELTASONE) 20 MG tablet; Take 1-2 tablets (20-40 mg total) by mouth daily with breakfast. 40 in am and 20 mg in the pm  Dispense: 90 tablet; Refill: 0 - Ambulatory referral to General Surgery - Ambulatory referral to Rheumatology

## 2016-08-22 ENCOUNTER — Ambulatory Visit (HOSPITAL_COMMUNITY): Payer: Medicare Other

## 2016-08-22 ENCOUNTER — Ambulatory Visit (HOSPITAL_COMMUNITY): Admission: RE | Admit: 2016-08-22 | Payer: Medicare Other | Source: Ambulatory Visit

## 2016-08-22 ENCOUNTER — Other Ambulatory Visit: Payer: Self-pay | Admitting: Family Medicine

## 2016-08-22 ENCOUNTER — Ambulatory Visit (HOSPITAL_COMMUNITY)
Admission: RE | Admit: 2016-08-22 | Discharge: 2016-08-22 | Disposition: A | Discharge status: Home / Self Care | Payer: Medicare Other | Source: Ambulatory Visit | Attending: Family Medicine | Admitting: Family Medicine

## 2016-08-22 DIAGNOSIS — H547 Unspecified visual loss: Secondary | ICD-10-CM

## 2016-08-22 DIAGNOSIS — H53131 Sudden visual loss, right eye: Secondary | ICD-10-CM | POA: Diagnosis not present

## 2016-08-22 DIAGNOSIS — M353 Polymyalgia rheumatica: Secondary | ICD-10-CM | POA: Diagnosis not present

## 2016-08-22 DIAGNOSIS — I10 Essential (primary) hypertension: Secondary | ICD-10-CM | POA: Diagnosis not present

## 2016-08-22 DIAGNOSIS — E785 Hyperlipidemia, unspecified: Secondary | ICD-10-CM | POA: Diagnosis not present

## 2016-08-22 DIAGNOSIS — R51 Headache: Secondary | ICD-10-CM | POA: Diagnosis not present

## 2016-08-22 LAB — COMPLETE METABOLIC PANEL WITH GFR
ALT: 14 U/L (ref 6–29)
AST: 14 U/L (ref 10–35)
Albumin: 4.1 g/dL (ref 3.6–5.1)
Alkaline Phosphatase: 37 U/L (ref 33–130)
BUN: 20 mg/dL (ref 7–25)
CHLORIDE: 101 mmol/L (ref 98–110)
CO2: 29 mmol/L (ref 20–31)
Calcium: 9.8 mg/dL (ref 8.6–10.4)
Creat: 0.73 mg/dL (ref 0.50–0.99)
GFR, EST NON AFRICAN AMERICAN: 84 mL/min (ref >=60)
Glucose, Bld: 108 mg/dL — ABNORMAL HIGH (ref 65–99)
POTASSIUM: 4.3 mmol/L (ref 3.5–5.3)
SODIUM: 139 mmol/L (ref 135–146)
TOTAL PROTEIN: 6.9 g/dL (ref 6.1–8.1)
Total Bilirubin: 0.4 mg/dL (ref 0.2–1.2)

## 2016-08-22 LAB — CBC WITH DIFFERENTIAL/PLATELET
BASOS ABS: 59 {cells}/uL (ref 0–200)
BASOS PCT: 1 %
EOS PCT: 3 %
Eosinophils Absolute: 177 cells/uL (ref 15–500)
HEMATOCRIT: 40 % (ref 35.0–45.0)
Hemoglobin: 13 g/dL (ref 11.7–15.5)
LYMPHS ABS: 1416 {cells}/uL (ref 850–3900)
Lymphocytes Relative: 24 %
MCH: 28.3 pg (ref 27.0–33.0)
MCHC: 32.5 g/dL (ref 32.0–36.0)
MCV: 87 fL (ref 80.0–100.0)
MONO ABS: 708 {cells}/uL (ref 200–950)
MPV: 10.3 fL (ref 7.5–12.5)
Monocytes Relative: 12 %
NEUTROS ABS: 3540 {cells}/uL (ref 1500–7800)
NEUTROS PCT: 60 %
PLATELETS: 186 10*3/uL (ref 140–400)
RBC: 4.6 MIL/uL (ref 3.80–5.10)
RDW: 15 % (ref 11.0–15.0)
WBC: 5.9 10*3/uL (ref 3.8–10.8)

## 2016-08-22 LAB — LIPID PANEL
Cholesterol: 219 mg/dL — ABNORMAL HIGH (ref 125–200)
HDL: 59 mg/dL (ref >=46)
LDL CALC: 136 mg/dL — AB (ref <130)
TRIGLYCERIDES: 118 mg/dL (ref <150)
Total CHOL/HDL Ratio: 3.7 Ratio (ref <=5.0)
VLDL: 24 mg/dL (ref <30)

## 2016-08-23 ENCOUNTER — Ambulatory Visit (HOSPITAL_COMMUNITY)
Admission: RE | Admit: 2016-08-23 | Discharge: 2016-08-23 | Disposition: A | Discharge status: Home / Self Care | Payer: Medicare Other | Source: Ambulatory Visit | Attending: Family Medicine | Admitting: Family Medicine

## 2016-08-23 DIAGNOSIS — H53131 Sudden visual loss, right eye: Secondary | ICD-10-CM | POA: Diagnosis not present

## 2016-08-23 DIAGNOSIS — R519 Headache, unspecified: Secondary | ICD-10-CM

## 2016-08-23 DIAGNOSIS — J3489 Other specified disorders of nose and nasal sinuses: Secondary | ICD-10-CM | POA: Insufficient documentation

## 2016-08-23 DIAGNOSIS — R51 Headache: Secondary | ICD-10-CM | POA: Insufficient documentation

## 2016-08-23 LAB — C-REACTIVE PROTEIN: CRP: 7.8 mg/L (ref <8.0)

## 2016-08-23 LAB — SEDIMENTATION RATE: SED RATE: 11 mm/h (ref 0–30)

## 2016-08-26 ENCOUNTER — Other Ambulatory Visit: Payer: Self-pay | Admitting: Family Medicine

## 2016-08-26 DIAGNOSIS — R9089 Other abnormal findings on diagnostic imaging of central nervous system: Secondary | ICD-10-CM

## 2016-08-27 ENCOUNTER — Telehealth: Payer: Self-pay | Admitting: Family Medicine

## 2016-09-02 ENCOUNTER — Encounter: Payer: Self-pay | Admitting: General Surgery

## 2016-09-02 ENCOUNTER — Ambulatory Visit (INDEPENDENT_AMBULATORY_CARE_PROVIDER_SITE_OTHER): Payer: Medicare Other | Admitting: General Surgery

## 2016-09-02 VITALS — BP 130/70 | HR 74 | Resp 12 | Ht 62.0 in | Wt 205.0 lb

## 2016-09-02 DIAGNOSIS — G9349 Other encephalopathy: Secondary | ICD-10-CM | POA: Diagnosis not present

## 2016-09-02 DIAGNOSIS — R51 Headache: Secondary | ICD-10-CM | POA: Diagnosis not present

## 2016-09-02 DIAGNOSIS — G453 Amaurosis fugax: Secondary | ICD-10-CM | POA: Diagnosis not present

## 2016-09-02 DIAGNOSIS — H905 Unspecified sensorineural hearing loss: Secondary | ICD-10-CM | POA: Diagnosis not present

## 2016-09-02 DIAGNOSIS — G44019 Episodic cluster headache, not intractable: Secondary | ICD-10-CM

## 2016-09-02 NOTE — Progress Notes (Signed)
Patient ID: Bailey [name], female   DOB: 09-19-1946, 70 y.o.   MRN: [id number]  Chief Complaint  Patient presents with  . Other    temoral biopsy    HPI Bailey [name] is a 70 y.o. female here today for a evaluation of a temporal biopsy. Patient states in the last three months she has been having lot of headaches. MRI was done on 08/22/16. I have reviewed the history of present illness with the patient.  HPI  Past Medical History:  Diagnosis Date  . Arthritis   . Asthma   . B12 deficiency anemia   . History of colon polyps   . History of kidney stones   . HTN (hypertension)   . Hyperlipidemia   . Left nephrolithiasis   . OSA (obstructive sleep apnea)    STUDY 12 YRS CPAP RX-- BUT NONTOLERANT  . Right ureteral stone   . S/P aortic valve replacement    2013  AT [facility]--- CARDIOLOGIST--  DR CRAWFORD AT [facility]  . Seasonal allergies   . Type 2 diabetes mellitus (HCC)     Past Surgical History:  Procedure Laterality Date  . AORTIC VALVE REPLACEMENT  06/2012   [facility]  . APPENDECTOMY  1958  . CHOLECYSTECTOMY  1978  . COLONOSCOPY  05-09-2014  . CYSTOSCOPY/RETROGRADE/URETEROSCOPY Right 07/04/2014   Procedure: CYSTOSCOPY/RETROGRADE/URETEROSCOPY;  Surgeon: Valetta Fuller, MD;  Location[facility];  Service: Urology;  Laterality: Right;  . EXTRACORPOREAL SHOCK WAVE LITHOTRIPSY Right 06/ 2015     [facility]  . HOLMIUM LASER APPLICATION Right 07/04/2014   Procedure: HOLMIUM LASER APPLICATION;  Surgeon: Valetta Fuller, MD;  Location: [facility];  Service: Urology;  Laterality: Right;  . TONSILLECTOMY  1953    Family History  Problem Relation Age of Onset  . Colon cancer Neg Hx   . Asthma Daughter   . Asthma Maternal Grandfather   . Heart disease Sister   . Heart disease Mother   . Heart disease Father     Social History Social History  Substance Use Topics  . Smoking status: Never Smoker  . Smokeless tobacco: Never Used  . Alcohol use No     Allergies  Allergen Reactions  . Cyanocobalamin Other (See Comments)    Other reaction(s): Other (See Comments) Sublingual-unconcious  . Dilaudid [Hydromorphone Hcl] Other (See Comments)    Severe hypotension  . Naproxen Shortness Of Breath  . Percocet [Oxycodone-Acetaminophen] Shortness Of Breath    Can tolerate oxy IR  . Latex Hives  . Aspirin Other (See Comments)    Nose bleeds at higher doses  . Nsaids Other (See Comments)    Causes asthma  . Other Swelling    Eye drop preservative  . Rosuvastatin     Current Outpatient Prescriptions  Medication Sig Dispense Refill  . albuterol (PROAIR HFA) 108 (90 Base) MCG/ACT inhaler Inhale 2 puffs into the lungs every 4 (four) hours as needed for shortness of breath. 1 Inhaler 2  . aspirin 81 MG tablet Take 81 mg by mouth daily.    . Astaxanthin 4 MG CAPS Take by mouth 2 (two) times daily.    . blood glucose meter kit and supplies KIT Dispense based on patient and insurance preference. Use up to four times daily as directed. (FOR ICD-9 250.00, 250.01). 1 each 0  . blood glucose meter kit and supplies     . budesonide (PULMICORT) 0.5 MG/2ML nebulizer solution Inhale 2 mLs (0.5 mg total) into the  lungs 2 (two) times daily. 240 mL 4  . Cholecalciferol (VITAMIN D) 2000 UNITS tablet Take 2,000 Units by mouth daily.      . Coenzyme Q10 (CO Q-10) 100 MG CAPS Take 1 capsule by mouth daily.     . enalapril (VASOTEC) 10 MG tablet Take 1 tablet (10 mg total) by mouth daily. 90 tablet 1  . magnesium oxide (MAG-OX) 400 MG tablet Take by mouth.    . Menaquinone-7 (VITAMIN K2) 40 MCG TABS Take 1 tablet by mouth daily.    . metFORMIN (GLUCOPHAGE) 500 MG tablet Take 1 tablet (500 mg total) by mouth 2 (two) times daily. 180 tablet 1  . MULTIPLE VITAMIN PO     . Omega-3 Fatty Acids (FISH OIL) 1000 MG CAPS Take 1 capsule by mouth daily. UAD    . predniSONE (DELTASONE) 1 MG tablet Take 1 mg by mouth at bedtime.    . predniSONE (DELTASONE) 20 MG tablet  Take 1-2 tablets (20-40 mg total) by mouth daily with breakfast. 40 in am and 20 mg in the pm 90 tablet 0  . Probiotic Product (PROBIOTIC DAILY PO) Take 1 capsule by mouth daily.    . TURMERIC CURCUMIN PO Take 1 tablet by mouth every morning.      No current facility-administered medications for this visit.     Review of Systems Review of Systems  Constitutional: Negative.   Respiratory: Negative.   Cardiovascular: Negative.     Blood pressure 130/70, pulse 74, resp. rate 12, height 5' 2"  (1.575 m), weight 205 lb (93 kg).  Physical Exam Physical Exam  Constitutional: She is oriented to person, place, and time. She appears well-developed and well-nourished.  HENT:  Head:    Eyes: Conjunctivae are normal. No scleral icterus.  Neck: Neck supple.  Lymphadenopathy:    She has no cervical adenopathy.  Neurological: She is alert and oriented to person, place, and time.  Skin: Skin is warm and dry.    Data Reviewed Notes reviewed  Neurology consult and MRI report CRP is mildly elevated. Sed rate normal. Assessment   New onset headache. Based on neurology evaluation, there is no mention of temporal arteritis. Not sure if this is needed.    Plan    Will discuss with Dr. Manuella Ghazi -neurologist who saw her Pt will be advised after.      This information has been scribed by Gaspar Cola CMA.  Makenzee Choudhry G 09/04/2016, 8:06 AM

## 2016-09-04 ENCOUNTER — Encounter: Payer: Self-pay | Admitting: General Surgery

## 2016-09-04 NOTE — Telephone Encounter (Signed)
The appointment dates have past, please close encounter.

## 2016-09-05 ENCOUNTER — Telehealth: Payer: Self-pay | Admitting: *Deleted

## 2016-09-05 NOTE — Telephone Encounter (Signed)
Patient called and wanted to touch base with you about getting in touch with Dr. Manuella Ghazi. She is wanting to know what are her next steps.

## 2016-09-12 NOTE — Telephone Encounter (Signed)
Patient called again regarding the need for temporal artery biopsy. She would like to know one way or another if it needs to be done. And if she needs to be referred to Neurology?

## 2016-09-30 DIAGNOSIS — I6523 Occlusion and stenosis of bilateral carotid arteries: Secondary | ICD-10-CM | POA: Diagnosis not present

## 2016-09-30 DIAGNOSIS — G453 Amaurosis fugax: Secondary | ICD-10-CM | POA: Diagnosis not present

## 2016-10-01 ENCOUNTER — Other Ambulatory Visit: Payer: Self-pay | Admitting: Neurology

## 2016-10-01 ENCOUNTER — Other Ambulatory Visit (HOSPITAL_COMMUNITY): Payer: Self-pay | Admitting: Neurology

## 2016-10-01 DIAGNOSIS — H900 Conductive hearing loss, bilateral: Secondary | ICD-10-CM | POA: Diagnosis not present

## 2016-10-01 DIAGNOSIS — I6522 Occlusion and stenosis of left carotid artery: Secondary | ICD-10-CM

## 2016-10-01 DIAGNOSIS — G4733 Obstructive sleep apnea (adult) (pediatric): Secondary | ICD-10-CM | POA: Diagnosis not present

## 2016-10-01 DIAGNOSIS — H6123 Impacted cerumen, bilateral: Secondary | ICD-10-CM | POA: Diagnosis not present

## 2016-10-08 ENCOUNTER — Ambulatory Visit (HOSPITAL_COMMUNITY): Payer: Medicare Other

## 2016-10-10 ENCOUNTER — Ambulatory Visit (HOSPITAL_COMMUNITY)
Admission: RE | Admit: 2016-10-10 | Discharge: 2016-10-10 | Disposition: A | Discharge status: Home / Self Care | Payer: Medicare Other | Source: Ambulatory Visit | Attending: Neurology | Admitting: Neurology

## 2016-10-10 DIAGNOSIS — I6501 Occlusion and stenosis of right vertebral artery: Secondary | ICD-10-CM | POA: Diagnosis not present

## 2016-10-10 DIAGNOSIS — I6522 Occlusion and stenosis of left carotid artery: Secondary | ICD-10-CM | POA: Insufficient documentation

## 2016-10-10 DIAGNOSIS — M4322 Fusion of spine, cervical region: Secondary | ICD-10-CM | POA: Diagnosis not present

## 2016-10-10 LAB — POCT I-STAT CREATININE: Creatinine, Ser: 0.8 mg/dL (ref 0.44–1.00)

## 2016-10-10 MED ORDER — IOPAMIDOL (ISOVUE-370) INJECTION 76%
INTRAVENOUS | Status: AC
  Administered 2016-10-10: 50 mL
  Filled 2016-10-10: qty 50

## 2016-10-21 ENCOUNTER — Other Ambulatory Visit: Payer: Self-pay | Admitting: *Deleted

## 2016-10-21 ENCOUNTER — Encounter: Payer: Self-pay | Admitting: Family Medicine

## 2016-10-21 DIAGNOSIS — I6523 Occlusion and stenosis of bilateral carotid arteries: Secondary | ICD-10-CM

## 2016-11-07 DIAGNOSIS — E119 Type 2 diabetes mellitus without complications: Secondary | ICD-10-CM | POA: Diagnosis not present

## 2016-11-07 DIAGNOSIS — H524 Presbyopia: Secondary | ICD-10-CM | POA: Diagnosis not present

## 2016-11-07 DIAGNOSIS — H2513 Age-related nuclear cataract, bilateral: Secondary | ICD-10-CM | POA: Diagnosis not present

## 2016-11-07 LAB — HM DIABETES EYE EXAM

## 2016-11-08 ENCOUNTER — Encounter: Payer: Self-pay | Admitting: Family Medicine

## 2016-11-13 ENCOUNTER — Encounter: Payer: Self-pay | Admitting: Vascular Surgery

## 2016-11-21 ENCOUNTER — Telehealth: Payer: Self-pay | Admitting: Cardiovascular Disease

## 2016-11-21 ENCOUNTER — Ambulatory Visit (HOSPITAL_COMMUNITY)
Admission: RE | Admit: 2016-11-21 | Discharge: 2016-11-21 | Disposition: A | Discharge status: Home / Self Care | Payer: Medicare Other | Source: Ambulatory Visit | Attending: Vascular Surgery | Admitting: Vascular Surgery

## 2016-11-21 ENCOUNTER — Other Ambulatory Visit (HOSPITAL_BASED_OUTPATIENT_CLINIC_OR_DEPARTMENT_OTHER): Payer: Self-pay

## 2016-11-21 ENCOUNTER — Ambulatory Visit (INDEPENDENT_AMBULATORY_CARE_PROVIDER_SITE_OTHER): Payer: Medicare Other | Admitting: Vascular Surgery

## 2016-11-21 ENCOUNTER — Encounter: Payer: Self-pay | Admitting: Vascular Surgery

## 2016-11-21 VITALS — BP 121/77 | HR 74 | Temp 98.0°F | Resp 20 | Ht 62.0 in | Wt 202.9 lb

## 2016-11-21 DIAGNOSIS — I6523 Occlusion and stenosis of bilateral carotid arteries: Secondary | ICD-10-CM | POA: Insufficient documentation

## 2016-11-21 DIAGNOSIS — G4730 Sleep apnea, unspecified: Secondary | ICD-10-CM

## 2016-11-21 DIAGNOSIS — R0683 Snoring: Secondary | ICD-10-CM

## 2016-11-21 DIAGNOSIS — I6522 Occlusion and stenosis of left carotid artery: Secondary | ICD-10-CM | POA: Diagnosis not present

## 2016-11-21 NOTE — Progress Notes (Signed)
Patient name: Bailey [name] MRN: [id number] DOB: 01-Feb-1946 Sex: female  REASON FOR CONSULT: Left carotid stenosis. Referred by Dr. Manuella Ghazi.  HPI: Bailey [name] is a 70 y.o. female, who had developed weakness confusion and a headache which prompted a carotid duplex scan which showed some carotid plaque. Subsequent CT angiogram showed a 60% left carotid stenosis and the patient is referred for vascular consultation.  The patient is right-handed. Not too long after the solar clips, she had an episode of transient loss of vision in the right eye which lasted very briefly and resolved completely. This prompted a duplex scan which showed evidence of a left carotid stenosis. She denies any previous history of stroke, TIAs, expressive or receptive aphasia. She does not take aspirin consistently she tells me. She does not tolerate statins. She is not a smoker.   I have reviewed the office records that were sent with the patient. The patient was seen on 09/02/2016 with headaches. MRI and MRA of the brain in September showed no acute intracranial abnormality. The patient had describes some visual disturbance and a carotid duplex was ordered at that time also the results of which are described below.  Past Medical History:  Diagnosis Date  . Arthritis   . Asthma   . B12 deficiency anemia   . History of colon polyps   . History of kidney stones   . HTN (hypertension)   . Hyperlipidemia   . Left nephrolithiasis   . OSA (obstructive sleep apnea)    STUDY 12 YRS CPAP RX-- BUT NONTOLERANT  . Right ureteral stone   . S/P aortic valve replacement    2013  AT [facility]--- CARDIOLOGIST--  [facility]  . Seasonal allergies   . Type 2 diabetes mellitus (HCC)     Family History  Problem Relation Age of Onset  . Heart disease Mother   . Heart disease Father   . Asthma Daughter   . Asthma Maternal Grandfather   . Heart disease Sister   . Colon cancer Neg Hx     SOCIAL HISTORY: Social History    Social History  . Marital status: Divorced    Spouse name: N/A  . Number of children: N/A  . Years of education: N/A   Occupational History  . Not on file.   Social History Main Topics  . Smoking status: Never Smoker  . Smokeless tobacco: Never Used  . Alcohol use No  . Drug use: No  . Sexual activity: Not Currently   Other Topics Concern  . 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 Drinks 2-3 caffeinated beverages/day    Allergies  Allergen Reactions  . Cyanocobalamin Other (See Comments)    Other reaction(s): Other (See Comments) Sublingual-unconcious  . Dilaudid [Hydromorphone Hcl] Other (See Comments)    Severe hypotension - pt states that if she is given this medication it will kill her  . Naproxen Shortness Of Breath  . Percocet [Oxycodone-Acetaminophen] Shortness Of Breath    Can tolerate oxy IR  . Latex Hives  . Aspirin Other (See Comments)    Nose bleeds at higher doses  . Nsaids Other (See Comments)    Causes asthma  . Other Swelling    Eye drop preservative  . Rosuvastatin     Current Outpatient Prescriptions  Medication Sig Dispense Refill  . albuterol (PROAIR HFA) 108 (90 Base) MCG/ACT inhaler Inhale 2 puffs into the lungs  every 4 (four) hours as needed for shortness of breath. 1 Inhaler 2  . aspirin 81 MG tablet Take 81 mg by mouth daily.    . Astaxanthin 4 MG CAPS Take by mouth 2 (two) times daily.    . blood glucose meter kit and supplies KIT Dispense based on patient and insurance preference. Use up to four times daily as directed. (FOR ICD-9 250.00, 250.01). 1 each 0  . blood glucose meter kit and supplies     . budesonide (PULMICORT) 0.5 MG/2ML nebulizer solution Inhale 2 mLs (0.5 mg total) into the lungs 2 (two) times daily. 240 mL 4  . Cholecalciferol (VITAMIN D) 2000 UNITS tablet Take 2,000 Units by mouth daily.      . Coenzyme Q10 (CO Q-10) 100 MG CAPS Take 1 capsule by mouth daily.     .  enalapril (VASOTEC) 10 MG tablet Take 1 tablet (10 mg total) by mouth daily. 90 tablet 1  . magnesium oxide (MAG-OX) 400 MG tablet Take by mouth.    . Menaquinone-7 (VITAMIN K2) 40 MCG TABS Take 1 tablet by mouth daily.    . metFORMIN (GLUCOPHAGE) 500 MG tablet Take 1 tablet (500 mg total) by mouth 2 (two) times daily. 180 tablet 1  . MULTIPLE VITAMIN PO     . Omega-3 Fatty Acids (FISH OIL) 1000 MG CAPS Take 1 capsule by mouth daily. UAD    . predniSONE (DELTASONE) 1 MG tablet Take 1 mg by mouth at bedtime.    . predniSONE (DELTASONE) 20 MG tablet Take 1-2 tablets (20-40 mg total) by mouth daily with breakfast. 40 in am and 20 mg in the pm 90 tablet 0  . Probiotic Product (PROBIOTIC DAILY PO) Take 1 capsule by mouth daily.    . TURMERIC CURCUMIN PO Take 1 tablet by mouth every morning.      No current facility-administered medications for this visit.     REVIEW OF SYSTEMS:  '[X]'$  denotes positive finding, '[ ]'$  denotes negative finding Cardiac  Comments:  Chest pain or chest pressure:    Shortness of breath upon exertion:    Short of breath when lying flat:    Irregular heart rhythm:        Vascular    Pain in calf, thigh, or hip brought on by ambulation:    Pain in feet at night that wakes you up from your sleep:     Blood clot in your veins:    Leg swelling:         Pulmonary    Oxygen at home:    Productive cough:     Wheezing:  X       Neurologic    Sudden weakness in arms or legs:     Sudden numbness in arms or legs:     Sudden onset of difficulty speaking or slurred speech:    Temporary loss of vision in one eye:     Problems with dizziness:  X       Gastrointestinal    Blood in stool:     Vomited blood:         Genitourinary    Burning when urinating:     Blood in urine:        Psychiatric    Major depression:         Hematologic    Bleeding problems:    Problems with blood clotting too easily:        Skin    Rashes or  ulcers:        Constitutional      Fever or chills:      PHYSICAL EXAM: Vitals:   11/21/16 0911 11/21/16 0914  BP: 109/70 121/77  Pulse: 74   Resp: 20   Temp: 98 F (36.7 C)   TempSrc: Oral   SpO2: 98%   Weight: 202 lb 14.4 oz (92 kg)   Height: 5' 2"  (1.575 m)     GENERAL: The patient is a well-nourished female, in no acute distress. The vital signs are documented above. CARDIAC: There is a regular rate and rhythm.  VASCULAR: I do not detect carotid bruits. She has crisp heart sounds. She is status post aortic valve replacement. She has no significant lower extremity swelling. PULMONARY: There is good air exchange bilaterally without wheezing or rales. ABDOMEN: Soft and non-tender with normal pitched bowel sounds.  MUSCULOSKELETAL: There are no major deformities or cyanosis. NEUROLOGIC: No focal weakness or paresthesias are detected. SKIN: There are no ulcers or rashes noted. PSYCHIATRIC: The patient has a normal affect.  DATA:   CT ANGIOGRAM NECK:  I have reviewed the CT angiogram of the neck that was done on 11/217. There is a stenosis of the left carotid bifurcation at the origin of the ICA with a 60% stenosis.  CAROTID DUPLEX: I have reviewed the carotid duplex scan that was done on 09/30/2016. This showed evidence of a greater than 70% left carotid stenosis. There was no significant plaque on the right side.  LIMITED CAROTID DUPLEX: I have independently interpreted her limited carotid duplex scan on the left today. The peak systolic velocity in the proximal left internal carotid artery is 256 cm/s with an end-diastolic velocity of 199 cm/s. This suggests a 60-79% carotid stenosis.  MEDICAL ISSUES:  ASYMPTOMATIC 60-79% LEFT CAROTID STENOSIS: This patient has an asymptomatic 60-79% left carotid stenosis. She did have an episode of amaurosis fugax but this was in the right eye. She has no significant carotid stenosis on the right by CT scan or by duplex. I've explained that we would not consider carotid  endarterectomy given that she is asymptomatic and less than a stenosis progressed to greater than 80%. I have ordered a follow up carotid duplex scan in 6 months and I'll see her back at that time. I've encouraged her to take aspirin daily. We also discussed exercise and nutrition. She mentioned that she might need a temporal artery biopsy and certainly we would be happy to arrange that if indeed this is desired by her primary care physician. She will discuss this with her. She has also requested a referral for a cardiologist to follow her aortic valve replacement as she does not like driving to [facility]. Her aortic valve replacement was done in [place], Hydrologist [facility] [employer] [facility] [phone]

## 2016-11-21 NOTE — Telephone Encounter (Signed)
PT NEEDS  NEW PT  CONSULT  WITH  CARDIOLOGY TO FOLLOW    AVR

## 2016-11-21 NOTE — Telephone Encounter (Signed)
New message  New pt appt.  Wants wed-fri, before 11 am, as early as possible  Please call pt back and confirm appt

## 2016-11-26 NOTE — Addendum Note (Signed)
Addended by: Lianne Cure A on: 11/26/2016 11:41 AM   Modules accepted: Orders

## 2016-12-10 ENCOUNTER — Ambulatory Visit (HOSPITAL_BASED_OUTPATIENT_CLINIC_OR_DEPARTMENT_OTHER): Payer: PRIVATE HEALTH INSURANCE

## 2016-12-11 ENCOUNTER — Encounter (HOSPITAL_BASED_OUTPATIENT_CLINIC_OR_DEPARTMENT_OTHER): Payer: PRIVATE HEALTH INSURANCE

## 2016-12-13 DIAGNOSIS — H01002 Unspecified blepharitis right lower eyelid: Secondary | ICD-10-CM | POA: Diagnosis not present

## 2016-12-13 DIAGNOSIS — H01001 Unspecified blepharitis right upper eyelid: Secondary | ICD-10-CM | POA: Diagnosis not present

## 2016-12-13 DIAGNOSIS — H01004 Unspecified blepharitis left upper eyelid: Secondary | ICD-10-CM | POA: Diagnosis not present

## 2016-12-13 DIAGNOSIS — H01005 Unspecified blepharitis left lower eyelid: Secondary | ICD-10-CM | POA: Diagnosis not present

## 2016-12-14 ENCOUNTER — Other Ambulatory Visit: Payer: Self-pay | Admitting: Family Medicine

## 2017-01-14 ENCOUNTER — Ambulatory Visit (HOSPITAL_BASED_OUTPATIENT_CLINIC_OR_DEPARTMENT_OTHER): Payer: Medicare Other | Attending: Otolaryngology | Admitting: Internal Medicine

## 2017-01-14 DIAGNOSIS — G4730 Sleep apnea, unspecified: Secondary | ICD-10-CM

## 2017-01-14 DIAGNOSIS — R0683 Snoring: Secondary | ICD-10-CM

## 2017-01-14 DIAGNOSIS — G4733 Obstructive sleep apnea (adult) (pediatric): Secondary | ICD-10-CM | POA: Insufficient documentation

## 2017-01-15 DIAGNOSIS — H2513 Age-related nuclear cataract, bilateral: Secondary | ICD-10-CM | POA: Diagnosis not present

## 2017-01-17 ENCOUNTER — Telehealth: Payer: Self-pay | Admitting: Family Medicine

## 2017-01-17 NOTE — Telephone Encounter (Signed)
Continue prednisone per rheumatologist - last dose that can control her symptoms. She will need follow up for referral to neurologist. They will require notes and last time she came was 08/2016

## 2017-01-17 NOTE — Telephone Encounter (Signed)
Called pt and pt wants [place] to call if we get a cancellation.

## 2017-01-17 NOTE — Telephone Encounter (Signed)
Her last visit was 08/2016. Please ask her to follow up so we can discuss therapy and also referral

## 2017-01-17 NOTE — Telephone Encounter (Signed)
Pt called stating she was told to get a Biopsy for Arteritis? Dr Jacalyn Lefevre office never got with her. Pt wants to know if she needs to get this done? Pt also is asking for referral to [facility] in [place]. Pt has questions about Prednisone as well. Please call her back.

## 2017-01-19 DIAGNOSIS — G4730 Sleep apnea, unspecified: Secondary | ICD-10-CM | POA: Diagnosis not present

## 2017-01-19 NOTE — Procedures (Signed)
Patient Name: Bailey [name], Bailey [name] Date: 01/14/2017 Gender: Female D.O.B: [birth year] Age (years): 36 Referring Provider: Leta Baptist Height (inches): 62 Interpreting Physician: Baird Lyons MD, ABSM Weight (lbs): 210 RPSGT: Zadie Rhine BMI: 38 MRN: [id number] Neck Size: 16.00 CLINICAL INFORMATION Sleep Study Type: Split Night CPAP  Indication for sleep study: Snoring  Epworth Sleepiness Score: 7  SLEEP STUDY TECHNIQUE As per the AASM Manual for the Scoring of Sleep and Associated Events v2.3 (April 2016) with a hypopnea requiring 4% desaturations.  The channels recorded and monitored were frontal, central and occipital EEG, electrooculogram (EOG), submentalis EMG (chin), nasal and oral airflow, thoracic and abdominal wall motion, anterior tibialis EMG, snore microphone, electrocardiogram, and pulse oximetry. Continuous positive airway pressure (CPAP) was initiated when the patient met split night criteria and was titrated according to treat sleep-disordered breathing.  MEDICATIONS Medications self-administered by patient taken the night of the study : none reported  RESPIRATORY PARAMETERS Diagnostic  Total AHI (/hr): 19.1 RDI (/hr): 22.3 OA Index (/hr): 2.8 CA Index (/hr): 0.0 REM AHI (/hr): 52.5 NREM AHI (/hr): 12.8 Supine AHI (/hr): 67.7 Non-supine AHI (/hr): 11.91 Min O2 Sat (%): 79.00 Mean O2 (%): 93.69 Time below 88% (min): 5.8   Titration  Optimal Pressure (cm): 12 AHI at Optimal Pressure (/hr): 0.0 Min O2 at Optimal Pressure (%): 97.0 Supine % at Optimal (%): 100 Sleep % at Optimal (%): 85   SLEEP ARCHITECTURE The recording time for the entire night was 358.5 minutes.  During a baseline period of 215.3 minutes, the patient slept for 150.5 minutes in REM and nonREM, yielding a sleep efficiency of 69.9%. Sleep onset after lights out was 20.0 minutes with a REM latency of 152.5 minutes. The patient spent 11.96% of the night in stage N1 sleep, 66.45% in stage N2 sleep,  5.65% in stage N3 and 15.95% in REM.  During the titration period of 139.7 minutes, the patient slept for 135.5 minutes in REM and nonREM, yielding a sleep efficiency of 97.0%. Sleep onset after CPAP initiation was 2.8 minutes with a REM latency of 67.0 minutes. The patient spent 0.74% of the night in stage N1 sleep, 47.97% in stage N2 sleep, 8.49% in stage N3 and 42.80% in REM.  CARDIAC DATA The 2 lead EKG demonstrated sinus rhythm. The mean heart rate was 81.40 beats per minute. Other EKG findings include: None.  LEG MOVEMENT DATA The total Periodic Limb Movements of Sleep (PLMS) were 241. The PLMS index was 50.56 .  IMPRESSIONS - Moderate obstructive sleep apnea occurred during the diagnostic portion of the study(AHI = 19.1/hour). An optimal PAP pressure was selected for this patient ( 12 cm of water) - No significant central sleep apnea occurred during the diagnostic portion of the study (CAI = 0.0/hour). - Mild oxygen desaturation was noted during the diagnostic portion of the study (Min O2 = 79.00%). - No snoring was audible during the diagnostic portion of the study. - No cardiac abnormalities were noted during this study. - Moderate periodic limb movements of sleep occurred during the study.  DIAGNOSIS - Obstructive Sleep Apnea (327.23 [G47.33 ICD-10])  RECOMMENDATIONS - Trial of CPAP therapy on 12 cm H2O with a Small size Resmed Full Face Mask AirFit F20 mask and heated humidification. - Avoid alcohol, sedatives and other CNS depressants that may worsen sleep apnea and disrupt normal sleep architecture. - Sleep hygiene should be reviewed to assess factors that may improve sleep quality. - Weight management and regular exercise should be initiated or continued. -  If limb movement sleep disturbance isn't resolved by control of sleep apnea arousals, then a trial of specific therapy such as Requip or Mirapex might be considered.  [Electronically signed] 01/19/2017 01:05 PM  Baird Lyons MD, [place], American Board of Sleep Medicine   NPI: [id number]  [place], American Board of Sleep Medicine  ELECTRONICALLY SIGNED ON:  01/19/2017, 1:02 PM [place] PH: [phone]   FX: [phone] [place]

## 2017-01-20 ENCOUNTER — Ambulatory Visit (INDEPENDENT_AMBULATORY_CARE_PROVIDER_SITE_OTHER): Payer: Medicare Other | Admitting: Family Medicine

## 2017-01-20 ENCOUNTER — Encounter: Payer: Self-pay | Admitting: Family Medicine

## 2017-01-20 VITALS — BP 118/76 | HR 94 | Temp 98.1°F | Resp 16 | Ht 62.0 in | Wt 206.4 lb

## 2017-01-20 DIAGNOSIS — R51 Headache: Secondary | ICD-10-CM | POA: Diagnosis not present

## 2017-01-20 DIAGNOSIS — E119 Type 2 diabetes mellitus without complications: Secondary | ICD-10-CM

## 2017-01-20 DIAGNOSIS — M353 Polymyalgia rheumatica: Secondary | ICD-10-CM | POA: Diagnosis not present

## 2017-01-20 DIAGNOSIS — G4733 Obstructive sleep apnea (adult) (pediatric): Secondary | ICD-10-CM | POA: Insufficient documentation

## 2017-01-20 DIAGNOSIS — G3184 Mild cognitive impairment, so stated: Secondary | ICD-10-CM

## 2017-01-20 DIAGNOSIS — R519 Headache, unspecified: Secondary | ICD-10-CM

## 2017-01-20 DIAGNOSIS — R9089 Other abnormal findings on diagnostic imaging of central nervous system: Secondary | ICD-10-CM

## 2017-01-20 DIAGNOSIS — I6523 Occlusion and stenosis of bilateral carotid arteries: Secondary | ICD-10-CM | POA: Diagnosis not present

## 2017-01-20 LAB — POCT GLYCOSYLATED HEMOGLOBIN (HGB A1C): Hemoglobin A1C: 6

## 2017-01-20 MED ORDER — PREDNISONE 5 MG PO TABS: 2.5000 mg | ORAL_TABLET | Freq: Every day | ORAL | 0 refills | Status: DC

## 2017-01-20 NOTE — Patient Instructions (Signed)
Class is [id number] One of the drugs is Repatha for cholesterol

## 2017-01-20 NOTE — Progress Notes (Signed)
Name: Bailey [name]   MRN: [id number]    DOB: 05/12/1946   Date:01/20/2017       Progress Note  Subjective  Chief Complaint  Chief Complaint  Patient presents with  . Polymyalgia Rheumatica    Needs clarification on medication    HPI  Headache/right vision loss: headaches started end of Summer 2017 pain was described as sharp pain on right temporal area a couple of times a week, she was seen by me in September because  the headache was associated with vision loss on right side that lasted about one minute, she also states she had one episode that she felt like her eyes were moving and she had to pull over while driving that same week. She was given higher dose of prednisone to cover for possible temporal arteritis, and she was referred to Dr. Jamal Collin but biopsy was not done, she was seen by Dr. Manuella Ghazi ( neurologist ) that ordered brain MRI ( reviewed with patient today ) and sleep study. She has OSA and will start CPAP soon. She also had carotid doppler that showed mild atherosclerosis but vascular surgeon ( Dr. Scot Dock ) advised aspirin and statin therapy. She has not been taking aspirin on a regular basis, and can't tolerate statins. Discussed injectable option with patient, such as Repatha today.Patietn has polymyalgia rheumatica and got confused with dose of prednisone. She continued to take 2.5 mg daily at the time of visit in September, but went to get a new prescription and was given the 20 mg dose. She is now not having any symptoms, headache has resolved, and advised her to continue on the 2.5 mg for now and keep follow up with Rheumatologist   DMII: she states she was doing well losing weight, but she lost her sister recently and has been eating to cope with her stress level. She has not been checking her glucose at home but denies polyphagia, polydipsia or polyuria. She would like to see a new neurologist.    Patient Active Problem List   Diagnosis Date Noted  . OSA (obstructive sleep  apnea) 01/20/2017  . Mild cognitive impairment 01/20/2017  . Abnormal brain MRI 08/26/2016  . Inflammatory polyarthritis ([place]) 11/12/2015  . Erosive osteoarthritis of hand 11/12/2015  . History of vitamin D deficiency 11/06/2015  . Hearing loss 08/24/2015  . Asthma, mild intermittent 08/23/2015  . Arthritis, degenerative 08/23/2015  . Dyslipidemia 08/23/2015  . H/O: hypothyroidism 08/23/2015  . Obesity (BMI 30-39.9) 08/23/2015  . HZV (herpes zoster virus) post herpetic neuralgia 08/23/2015  . History of renal stone 04/01/2014  . Diabetes mellitus, controlled ([place]) 04/01/2014  . Essential hypertension, benign 04/01/2014  . H/O prosthetic heart valve 06/15/2012  . CD (contact dermatitis) 07/25/2008    Past Surgical History:  Procedure Laterality Date  . AORTIC VALVE REPLACEMENT  06/2012   [facility]  . APPENDECTOMY  1958  . CHOLECYSTECTOMY  1978  . COLONOSCOPY  05-09-2014  . CYSTOSCOPY/RETROGRADE/URETEROSCOPY Right 07/04/2014   Procedure: CYSTOSCOPY/RETROGRADE/URETEROSCOPY;  Surgeon: Bernestine Amass, MD;  Location: [facility];  Service: Urology;  Laterality: Right;  . EXTRACORPOREAL SHOCK WAVE LITHOTRIPSY Right 06/ 2015     [facility]  . HOLMIUM LASER APPLICATION Right 9/98/3382   Procedure: HOLMIUM LASER APPLICATION;  Surgeon: Bernestine Amass, MD;  Location: [facility];  Service: Urology;  Laterality: Right;  . TONSILLECTOMY  1953    Family History  Problem Relation Age of Onset  . Heart disease Mother   . Heart  disease Father   . Asthma Daughter   . Asthma Maternal Grandfather   . Heart disease Sister   . Heart attack Sister 89  . Colon cancer Neg Hx     Social History   Social History  . Marital status: Divorced    Spouse name: N/A  . Number of children: N/A  . Years of education: N/A   Occupational History  . Not on file.   Social History Main Topics  . Smoking status: Never Smoker  . Smokeless tobacco: Never Used  . Alcohol use No  .  Drug use: No  . Sexual activity: Not Currently   Other Topics Concern  . 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 Drinks 2-3 caffeinated beverages/day     Current Outpatient Prescriptions:  .  albuterol (PROAIR HFA) 108 (90 Base) MCG/ACT inhaler, Inhale 2 puffs into the lungs every 4 (four) hours as needed for shortness of breath., Disp: 1 Inhaler, Rfl: 2 .  aspirin 81 MG tablet, Take 81 mg by mouth daily., Disp: , Rfl:  .  Astaxanthin 4 MG CAPS, Take by mouth 2 (two) times daily., Disp: , Rfl:  .  blood glucose meter kit and supplies KIT, Dispense based on patient and insurance preference. Use up to four times daily as directed. (FOR ICD-9 250.00, 250.01)., Disp: 1 each, Rfl: 0 .  blood glucose meter kit and supplies, , Disp: , Rfl:  .  budesonide (PULMICORT) 0.5 MG/2ML nebulizer solution, Inhale 2 mLs (0.5 mg total) into the lungs 2 (two) times daily., Disp: 240 mL, Rfl: 4 .  Cholecalciferol (VITAMIN D) 2000 UNITS tablet, Take 2,000 Units by mouth daily.  , Disp: , Rfl:  .  Coenzyme Q10 (CO Q-10) 100 MG CAPS, Take 1 capsule by mouth daily. , Disp: , Rfl:  .  enalapril (VASOTEC) 10 MG tablet, TAKE 1 TABLET BY MOUTH DAILY, Disp: 90 tablet, Rfl: 0 .  magnesium oxide (MAG-OX) 400 MG tablet, Take by mouth., Disp: , Rfl:  .  Menaquinone-7 (VITAMIN K2) 40 MCG TABS, Take 1 tablet by mouth daily., Disp: , Rfl:  .  metFORMIN (GLUCOPHAGE) 500 MG tablet, Take 1 tablet (500 mg total) by mouth 2 (two) times daily., Disp: 180 tablet, Rfl: 1 .  MULTIPLE VITAMIN PO, , Disp: , Rfl:  .  Omega-3 Fatty Acids (FISH OIL) 1000 MG CAPS, Take 1 capsule by mouth daily. UAD, Disp: , Rfl:  .  predniSONE (DELTASONE) 1 MG tablet, Take 1 mg by mouth at bedtime., Disp: , Rfl:  .  predniSONE (DELTASONE) 20 MG tablet, Take 1-2 tablets (20-40 mg total) by mouth daily with breakfast. 40 in am and 20 mg in the pm, Disp: 90 tablet, Rfl: 0 .  Probiotic Product (PROBIOTIC DAILY PO),  Take 1 capsule by mouth daily., Disp: , Rfl:  .  TURMERIC CURCUMIN PO, Take 1 tablet by mouth every morning. , Disp: , Rfl:   Allergies  Allergen Reactions  . Cyanocobalamin Other (See Comments)    Other reaction(s): Other (See Comments) Sublingual-unconcious  . Dilaudid [Hydromorphone Hcl] Other (See Comments)    Severe hypotension - pt states that if she is given this medication it will kill her  . Naproxen Shortness Of Breath  . Percocet [Oxycodone-Acetaminophen] Shortness Of Breath    Can tolerate oxy IR  . Latex Hives  . Aspirin Other (See Comments)    Nose bleeds at higher doses  . Nsaids  Other (See Comments)    Causes asthma  . Other Swelling    Eye drop preservative  . Rosuvastatin      ROS  Constitutional: Negative for fever, positive for  weight change.  Respiratory: Negative for cough and shortness of breath.   Cardiovascular: Negative for chest pain or palpitations.  Gastrointestinal: Negative for abdominal pain, no bowel changes.  Musculoskeletal: Negative for gait problem or joint swelling.  Skin: Negative for rash.  Neurological: Negative for dizziness or headache.  No other specific complaints in a complete review of systems (except as listed in HPI above).  Objective  Vitals:   01/20/17 1521  BP: 118/76  Pulse: 94  Resp: 16  Temp: 98.1 F (36.7 C)  TempSrc: Oral  SpO2: 98%  Weight: 206 lb 6.4 oz (93.6 kg)  Height: 5' 2"  (1.575 m)    Body mass index is 37.75 kg/m.  Physical Exam  Constitutional: Patient appears well-developed and well-nourished. Obese  No distress.  HEENT: head atraumatic, normocephalic, pupils equal and reactive to light, ears normal TM bilaterally neck supple, throat within normal limits, no tenderness during palpation of right temporal area  Cardiovascular: Normal rate, regular rhythm and normal heart sounds.  SEM over 2nd intercostal space left side, s/p aortic valve replacement  No BLE edema. Pulmonary/Chest: Effort  normal and breath sounds normal. No respiratory distress. Abdominal: Soft.  There is no tenderness. Psychiatric: Patient has a normal mood and affect. behavior is normal. Judgment and thought content normal. Neurological : no focal findings   Recent Results (from the past 2160 hour(s))  HM DIABETES EYE EXAM     Status: None   Collection Time: 11/07/16 12:00 AM  Result Value Ref Range   HM Diabetic Eye Exam No Retinopathy No Retinopathy    Comment: [facility] Dr. Prudencio Burly  POCT HgB A1C     Status: Normal   Collection Time: 01/20/17  3:32 PM  Result Value Ref Range   Hemoglobin A1C 6.0       PHQ2/9: Depression screen [facility] 2/9 08/21/2016 08/24/2015 07/13/2012  Decreased Interest 0 0 0  Down, Depressed, Hopeless 0 0 0  PHQ - 2 Score 0 0 0     Fall Risk: Fall Risk  08/21/2016 08/24/2015  Falls in the past year? No No      Assessment & Plan  1. Controlled type 2 diabetes mellitus without complication, without long-term current use of insulin (HCC)  - POCT HgB A1C  2. Polymyalgia rheumatica syndrome ([place])  She never started Prednisone high dose, she was taking low dose of prednisone  3. Vision, loss, sudden, right  Resolved, never had temporal artery biopsy, seen by vascular surgeon and advised to take aspirin daily and monitor [place] every 6 months  4. OSA (obstructive sleep apnea)  She has sleep study ordered by Dr. Manuella Ghazi and is waiting form CPAP machine   5. Mild cognitive impairment  Per Dr. Manuella Ghazi, she will start CPAP   6. Carotid atherosclerosis, bilateral  She needs to take aspirin daily, discussed statin therapy, but she can't tolerate she will try

## 2017-02-12 ENCOUNTER — Telehealth: Payer: Self-pay | Admitting: Family Medicine

## 2017-02-12 DIAGNOSIS — Z952 Presence of prosthetic heart valve: Secondary | ICD-10-CM

## 2017-02-12 NOTE — Telephone Encounter (Signed)
Please review the referral note from 11/21/16 and decide if you want to resubmit another referral to the cardiologists.

## 2017-02-12 NOTE — Telephone Encounter (Signed)
Pt states she let her referral expire at [facility] to see Dr Burt Knack. Pt would like to know if this could be redone. Pt would like for their office to call and schedule so she is sure to get there.

## 2017-03-05 DIAGNOSIS — M154 Erosive (osteo)arthritis: Secondary | ICD-10-CM | POA: Diagnosis not present

## 2017-03-05 DIAGNOSIS — M2550 Pain in unspecified joint: Secondary | ICD-10-CM | POA: Diagnosis not present

## 2017-03-05 DIAGNOSIS — Z6836 Body mass index (BMI) 36.0-36.9, adult: Secondary | ICD-10-CM | POA: Diagnosis not present

## 2017-03-05 DIAGNOSIS — M064 Inflammatory polyarthropathy: Secondary | ICD-10-CM | POA: Diagnosis not present

## 2017-03-05 DIAGNOSIS — E669 Obesity, unspecified: Secondary | ICD-10-CM | POA: Diagnosis not present

## 2017-03-14 DIAGNOSIS — H0520 Unspecified exophthalmos: Secondary | ICD-10-CM | POA: Diagnosis not present

## 2017-03-18 ENCOUNTER — Ambulatory Visit: Payer: Self-pay | Admitting: Neurology

## 2017-03-25 ENCOUNTER — Encounter: Payer: Self-pay | Admitting: Gastroenterology

## 2017-03-27 ENCOUNTER — Encounter: Payer: Self-pay | Admitting: Family Medicine

## 2017-03-27 ENCOUNTER — Ambulatory Visit (INDEPENDENT_AMBULATORY_CARE_PROVIDER_SITE_OTHER): Payer: Medicare Other | Admitting: Family Medicine

## 2017-03-27 VITALS — BP 118/70 | HR 81 | Temp 96.9°F | Resp 16 | Ht 62.0 in | Wt 204.2 lb

## 2017-03-27 DIAGNOSIS — E785 Hyperlipidemia, unspecified: Secondary | ICD-10-CM

## 2017-03-27 DIAGNOSIS — I10 Essential (primary) hypertension: Secondary | ICD-10-CM | POA: Diagnosis not present

## 2017-03-27 DIAGNOSIS — I6523 Occlusion and stenosis of bilateral carotid arteries: Secondary | ICD-10-CM | POA: Diagnosis not present

## 2017-03-27 DIAGNOSIS — E1169 Type 2 diabetes mellitus with other specified complication: Secondary | ICD-10-CM

## 2017-03-27 DIAGNOSIS — E119 Type 2 diabetes mellitus without complications: Secondary | ICD-10-CM | POA: Diagnosis not present

## 2017-03-27 DIAGNOSIS — R140 Abdominal distension (gaseous): Secondary | ICD-10-CM

## 2017-03-27 DIAGNOSIS — M064 Inflammatory polyarthropathy: Secondary | ICD-10-CM

## 2017-03-27 DIAGNOSIS — R6889 Other general symptoms and signs: Secondary | ICD-10-CM

## 2017-03-27 DIAGNOSIS — Z87442 Personal history of urinary calculi: Secondary | ICD-10-CM

## 2017-03-27 DIAGNOSIS — R1012 Left upper quadrant pain: Secondary | ICD-10-CM | POA: Diagnosis not present

## 2017-03-27 LAB — CBC WITH DIFFERENTIAL/PLATELET
Basophils Absolute: 63 cells/uL (ref 0–200)
Basophils Relative: 1 %
EOS PCT: 5 %
Eosinophils Absolute: 315 cells/uL (ref 15–500)
HCT: 39.4 % (ref 35.0–45.0)
Hemoglobin: 12.6 g/dL (ref 11.7–15.5)
LYMPHS PCT: 26 %
Lymphs Abs: 1638 cells/uL (ref 850–3900)
MCH: 28 pg (ref 27.0–33.0)
MCHC: 32 g/dL (ref 32.0–36.0)
MCV: 87.6 fL (ref 80.0–100.0)
MPV: 10.2 fL (ref 7.5–12.5)
Monocytes Absolute: 567 cells/uL (ref 200–950)
Monocytes Relative: 9 %
NEUTROS PCT: 59 %
Neutro Abs: 3717 cells/uL (ref 1500–7800)
Platelets: 189 10*3/uL (ref 140–400)
RBC: 4.5 MIL/uL (ref 3.80–5.10)
RDW: 14.9 % (ref 11.0–15.0)
WBC: 6.3 10*3/uL (ref 3.8–10.8)

## 2017-03-27 LAB — THYROID PANEL WITH TSH
Free Thyroxine Index: 2.3 (ref 1.4–3.8)
T3 Uptake: 29 % (ref 22–35)
T4, Total: 8 ug/dL (ref 4.5–12.0)
TSH: 2.09 m[IU]/L

## 2017-03-27 LAB — COMPLETE METABOLIC PANEL WITH GFR
ALBUMIN: 4 g/dL (ref 3.6–5.1)
ALK PHOS: 38 U/L (ref 33–130)
ALT: 13 U/L (ref 6–29)
AST: 17 U/L (ref 10–35)
BILIRUBIN TOTAL: 0.4 mg/dL (ref 0.2–1.2)
BUN: 17 mg/dL (ref 7–25)
CALCIUM: 9.5 mg/dL (ref 8.6–10.4)
CO2: 27 mmol/L (ref 20–31)
CREATININE: 0.77 mg/dL (ref 0.60–0.93)
Chloride: 104 mmol/L (ref 98–110)
GFR, Est African American: >89 mL/min (ref >=60)
GFR, Est Non African American: 78 mL/min (ref >=60)
Glucose, Bld: 104 mg/dL — ABNORMAL HIGH (ref 65–99)
Potassium: 4.6 mmol/L (ref 3.5–5.3)
Sodium: 143 mmol/L (ref 135–146)
Total Protein: 6.7 g/dL (ref 6.1–8.1)

## 2017-03-27 LAB — LIPASE: LIPASE: 16 U/L (ref 7–60)

## 2017-03-27 MED ORDER — METFORMIN HCL 500 MG PO TABS: 500.0000 mg | ORAL_TABLET | Freq: Two times a day (BID) | ORAL | 1 refills | Status: DC

## 2017-03-27 MED ORDER — ENALAPRIL MALEATE 10 MG PO TABS: 10.0000 mg | ORAL_TABLET | Freq: Every day | ORAL | 1 refills | Status: DC

## 2017-03-27 NOTE — Progress Notes (Signed)
Name: Bailey [name]   MRN: [id number]    DOB: January 31, 1946   Date:03/27/2017       Progress Note  Subjective  Chief Complaint  Chief Complaint  Patient presents with  . Follow-up  . Diabetes    no signs of problems    HPI  Headache/right vision loss: headaches started end of Summer 2017 pain was described as sharp pain on right temporal area a couple of times a week, she was seen by me in September because  the headache was associated with vision loss on right side that lasted about one minute, she also states she had one episode that she felt like her eyes were moving and she had to pull over while driving that same week. She was given higher dose of prednisone to cover for possible temporal arteritis, and she was referred to Dr. Jamal Collin but biopsy was not done, she was seen by Dr. Manuella Ghazi ( neurologist ) that ordered brain MRI ( reviewed with patient today ) and sleep study. She has OSA and will start CPAP soon. She also had carotid doppler that showed mild atherosclerosis but vascular surgeon ( Dr. Scot Dock ) advised aspirin and statin therapy. She has been taking aspirin daily,  and can't tolerate statins. Discussed injectable option with patient, such as Repatha today.Patient  has polymyalgia rheumatica and got confused with dose of prednisone. Rheumatologist still feels like Giant Cell arteritis can still be present with normal levels. She is still seeing eye doctor and is supposed to have CT scan of head to look for a tumor.  DMII: she has lost 2 lbs since last visit, but she states her weight fluctuatesl. She has not been checking her glucose at home but denies polyphagia, polydipsia or polyuria. She would like to see a new neurologist. Last hgbA1C was 6.0%  Asthma Mild Intermittent: she is using Pulmicort prn,not recently  albuterol occasionally, she is doing okay, but this time of the year she has some difficulty breathing and had to use inhaler this week while at work. No cough or wheezing  at night.   HTN: taking medication and denies side effects of medication, bp is at goal, no dizziness, chest pain or palpitation.   Obesity: she lost 2 lbs since last visit. She is concerned about her weight, she states she eats very healthy, smaller portions and is active with grandchildren, but she is not loosing as much weight  Dyslipidemia: unable to tolerate statin therapy   Hearing loss: seen by ENT, needs hearing aid but can't afford it at this time  Polymyalgia Rheumatica: seen by Rheumatologist and is currently on low dose prednisone, but still has joint aches all over.   Abdominal pain and LUQ pain and abdominal distension: she has noticed symptoms over the past 6 months, happens intermittently about twice per month. Described as very intense and sharp in the beginning, but after that is a sore feeling for a couple of days, it comes in waves during episode, no nausea or vomiting during episodes, and denies associated change in frequency, urgency or blood in urine. No change in bowel movements, no heartburn or epigastric pain.   Patient Active Problem List   Diagnosis Date Noted  . OSA (obstructive sleep apnea) 01/20/2017  . Mild cognitive impairment 01/20/2017  . Carotid atherosclerosis, bilateral 01/20/2017  . Abnormal brain MRI 08/26/2016  . Inflammatory polyarthritis ([place]) 11/12/2015  . Erosive osteoarthritis of hand 11/12/2015  . History of vitamin D deficiency 11/06/2015  . Hearing loss  08/24/2015  . Asthma, mild intermittent 08/23/2015  . Arthritis, degenerative 08/23/2015  . Dyslipidemia 08/23/2015  . H/O: hypothyroidism 08/23/2015  . Obesity (BMI 30-39.9) 08/23/2015  . HZV (herpes zoster virus) post herpetic neuralgia 08/23/2015  . History of renal stone 04/01/2014  . Diabetes mellitus, controlled ([place]) 04/01/2014  . Essential hypertension, benign 04/01/2014  . H/O prosthetic heart valve 06/15/2012  . CD (contact dermatitis) 07/25/2008    Past Surgical  History:  Procedure Laterality Date  . AORTIC VALVE REPLACEMENT  06/2012   [facility]  . APPENDECTOMY  1958  . CHOLECYSTECTOMY  1978  . COLONOSCOPY  05-09-2014  . CYSTOSCOPY/RETROGRADE/URETEROSCOPY Right 07/04/2014   Procedure: CYSTOSCOPY/RETROGRADE/URETEROSCOPY;  Surgeon: Bernestine Amass, MD;  [facility];  Service: Urology;  Laterality: Right;  . EXTRACORPOREAL SHOCK WAVE LITHOTRIPSY Right 06/ 2015     [place]  . HOLMIUM LASER APPLICATION Right 0/06/1218   Procedure: HOLMIUM LASER APPLICATION;  Surgeon: Bernestine Amass, MD;  Location[facility];  Service: Urology;  Laterality: Right;  . TONSILLECTOMY  1953    Family History  Problem Relation Age of Onset  . Heart disease Mother   . Heart disease Father   . Asthma Daughter   . Asthma Maternal Grandfather   . Heart disease Sister   . Heart attack Sister 58  . Colon cancer Neg Hx     Social History   Social History  . Marital status: Divorced    Spouse name: N/A  . Number of children: N/A  . Years of education: N/A   Occupational History  . Not on file.   Social History Main Topics  . Smoking status: Never Smoker  . Smokeless tobacco: Never Used  . Alcohol use No  . Drug use: No  . Sexual activity: Not Currently   Other Topics Concern  . 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 Drinks 2-3 caffeinated beverages/day     Current Outpatient Prescriptions:  .  albuterol (PROAIR HFA) 108 (90 Base) MCG/ACT inhaler, Inhale 2 puffs into the lungs every 4 (four) hours as needed for shortness of breath., Disp: 1 Inhaler, Rfl: 2 .  aspirin 81 MG tablet, Take 81 mg by mouth daily., Disp: , Rfl:  .  Astaxanthin 4 MG CAPS, Take by mouth 2 (two) times daily., Disp: , Rfl:  .  blood glucose meter kit and supplies KIT, Dispense based on patient and insurance preference. Use up to four times daily as directed. (FOR ICD-9 250.00, 250.01)., Disp: 1 each,  Rfl: 0 .  blood glucose meter kit and supplies, , Disp: , Rfl:  .  budesonide (PULMICORT) 0.5 MG/2ML nebulizer solution, Inhale 2 mLs (0.5 mg total) into the lungs 2 (two) times daily., Disp: 240 mL, Rfl: 4 .  Cholecalciferol (VITAMIN D) 2000 UNITS tablet, Take 2,000 Units by mouth daily.  , Disp: , Rfl:  .  Coenzyme Q10 (CO Q-10) 100 MG CAPS, Take 1 capsule by mouth daily. , Disp: , Rfl:  .  enalapril (VASOTEC) 10 MG tablet, TAKE 1 TABLET BY MOUTH DAILY, Disp: 90 tablet, Rfl: 0 .  magnesium oxide (MAG-OX) 400 MG tablet, Take by mouth., Disp: , Rfl:  .  Menaquinone-7 (VITAMIN K2) 40 MCG TABS, Take 1 tablet by mouth daily., Disp: , Rfl:  .  metFORMIN (GLUCOPHAGE) 500 MG tablet, Take 1 tablet (500 mg total) by mouth 2 (two) times daily., Disp: 180 tablet, Rfl: 1 .  MULTIPLE VITAMIN PO, , Disp: , Rfl:  .  Omega-3 Fatty Acids (FISH OIL) 1000 MG CAPS, Take 1 capsule by mouth daily. UAD, Disp: , Rfl:  .  Probiotic Product (PROBIOTIC DAILY PO), Take 1 capsule by mouth daily., Disp: , Rfl:   Allergies  Allergen Reactions  . Cyanocobalamin Other (See Comments)    Other reaction(s): Other (See Comments) Sublingual-unconcious  . Dilaudid [Hydromorphone Hcl] Other (See Comments)    Severe hypotension - pt states that if she is given this medication it will kill her  . Naproxen Shortness Of Breath  . Percocet [Oxycodone-Acetaminophen] Shortness Of Breath    Can tolerate oxy IR  . Latex Hives  . Aspirin Other (See Comments)    Nose bleeds at higher doses  . Nsaids Other (See Comments)    Causes asthma  . Other Swelling    Eye drop preservative  . Rosuvastatin      ROS  Constitutional: Negative for fever or significant weight change. Positive for decrease in appetite Respiratory: Negative for cough and shortness of breath.   Cardiovascular: Negative for chest pain or palpitations.  Gastrointestinal: Positive  for abdominal pain, no bowel changes.  Musculoskeletal: Positive  for gait problem  but no  joint swelling.  Skin: Negative for rash.  Neurological: Negative for dizziness , positive for intermittent headache.  No other specific complaints in a complete review of systems (except as listed in HPI above).  Objective  Vitals:   03/27/17 0819  BP: 118/70  Pulse: 81  Resp: 16  Temp: (!) 96.9 F (36.1 C)  TempSrc: Oral  SpO2: 97%  Weight: 204 lb 3.2 oz (92.6 kg)  Height: 5' 2" (1.575 m)    Body mass index is 37.35 kg/m.  Physical Exam  Constitutional: Patient appears well-developed and well-nourished. Obese  No distress.  HEENT: head atraumatic, normocephalic, pupils equal and reactive to light,  neck supple, throat within normal limits Cardiovascular: Normal rate, regular rhythm and normal heart sounds, opening click from aortic valve ( status replacement).  No murmur heard. No BLE edema. Pulmonary/Chest: Effort normal and breath sounds normal. No respiratory distress. Abdominal: Soft.  There is  Tenderness on epigastric and LUQ pain . Negative CVA tenderness  Psychiatric: Patient has a normal mood and affect. behavior is normal. Judgment and thought content normal.  Recent Results (from the past 2160 hour(s))  POCT HgB A1C     Status: Normal   Collection Time: 01/20/17  3:32 PM  Result Value Ref Range   Hemoglobin A1C 6.0      PHQ2/9: Depression screen [facility] 2/9 03/27/2017 08/21/2016 08/24/2015 07/13/2012  Decreased Interest 0 0 0 0  Down, Depressed, Hopeless 0 0 0 0  PHQ - 2 Score 0 0 0 0     Fall Risk: Fall Risk  03/27/2017 08/21/2016 08/24/2015  Falls in the past year? No No No     Functional Status Survey: Is the patient deaf or have difficulty hearing?: Yes Does the patient have difficulty seeing, even when wearing glasses/contacts?: Yes Does the patient have difficulty concentrating, remembering, or making decisions?: No Does the patient have difficulty walking or climbing stairs?: No Does the patient have difficulty dressing or bathing?: No Does  the patient have difficulty doing errands alone such as visiting a doctor's office or shopping?: No    Assessment & Plan  1. Left upper quadrant pain  - COMPLETE METABOLIC PANEL WITH GFR - CBC with Differential/Platelet - Lipase - H. pylori breath test  2. Abdominal distension  - COMPLETE METABOLIC PANEL WITH GFR - CBC with Differential/Platelet - Lipase - H. pylori breath test We will start PPI if needed, but she does not want to take it today  3. History of kidney stones  It may be the cause of left side pain  4. Controlled type 2 diabetes mellitus without complication, without long-term current use of insulin (HCC)  - COMPLETE METABOLIC PANEL WITH GFR She can't take GLP1 because of previous history of pancreatitis - metFORMIN (GLUCOPHAGE) 500 MG tablet; Take 1 tablet (500 mg total) by mouth 2 (two) times daily.  Dispense: 180 tablet; Refill: 1   5. Dyslipidemia associated with type 2 diabetes mellitus ([place])  She refuses statin therapy   6. Inflammatory polyarthritis ([place])  Continue follow up with Rheumatologist.    7. Cold intolerance  - Thyroid Panel With TSH   8. Hypertension, benign  - enalapril (VASOTEC) 10 MG tablet; Take 1 tablet (10 mg total) by mouth daily.  Dispense: 90 tablet; Refill: 1

## 2017-03-28 ENCOUNTER — Other Ambulatory Visit: Payer: Self-pay | Admitting: Family Medicine

## 2017-03-28 DIAGNOSIS — Z8639 Personal history of other endocrine, nutritional and metabolic disease: Secondary | ICD-10-CM

## 2017-03-28 LAB — H. PYLORI BREATH TEST: H. PYLORI BREATH TEST: NOT DETECTED

## 2017-04-11 ENCOUNTER — Ambulatory Visit: Payer: Self-pay | Admitting: Neurology

## 2017-04-16 DIAGNOSIS — M2550 Pain in unspecified joint: Secondary | ICD-10-CM | POA: Diagnosis not present

## 2017-04-16 DIAGNOSIS — Z6837 Body mass index (BMI) 37.0-37.9, adult: Secondary | ICD-10-CM | POA: Diagnosis not present

## 2017-04-16 DIAGNOSIS — H0520 Unspecified exophthalmos: Secondary | ICD-10-CM | POA: Diagnosis not present

## 2017-04-16 DIAGNOSIS — M154 Erosive (osteo)arthritis: Secondary | ICD-10-CM | POA: Diagnosis not present

## 2017-04-16 DIAGNOSIS — M064 Inflammatory polyarthropathy: Secondary | ICD-10-CM | POA: Diagnosis not present

## 2017-04-16 DIAGNOSIS — E669 Obesity, unspecified: Secondary | ICD-10-CM | POA: Diagnosis not present

## 2017-04-17 DIAGNOSIS — H0520 Unspecified exophthalmos: Secondary | ICD-10-CM | POA: Diagnosis not present

## 2017-04-18 ENCOUNTER — Other Ambulatory Visit: Payer: Self-pay | Admitting: Ophthalmology

## 2017-04-18 DIAGNOSIS — E039 Hypothyroidism, unspecified: Secondary | ICD-10-CM

## 2017-04-18 DIAGNOSIS — H0520 Unspecified exophthalmos: Secondary | ICD-10-CM

## 2017-04-23 ENCOUNTER — Ambulatory Visit (INDEPENDENT_AMBULATORY_CARE_PROVIDER_SITE_OTHER): Payer: Medicare Other | Admitting: Cardiovascular Disease

## 2017-04-23 ENCOUNTER — Encounter: Payer: Self-pay | Admitting: Cardiovascular Disease

## 2017-04-23 VITALS — BP 118/60 | HR 76 | Ht 62.0 in | Wt 203.2 lb

## 2017-04-23 DIAGNOSIS — I6523 Occlusion and stenosis of bilateral carotid arteries: Secondary | ICD-10-CM | POA: Diagnosis not present

## 2017-04-23 DIAGNOSIS — I10 Essential (primary) hypertension: Secondary | ICD-10-CM

## 2017-04-23 DIAGNOSIS — I359 Nonrheumatic aortic valve disorder, unspecified: Secondary | ICD-10-CM | POA: Diagnosis not present

## 2017-04-23 NOTE — Progress Notes (Signed)
Chief Complaint  Patient presents with  . New Patient (Initial Visit)    AVR    History of Present Illness: 71 yo female with history of HTN, HLD, DM, sleep apnea, aortic stenosis s/p AVR and carotid artery stenosis who is here today as a new patient to establish cardiology care. She underwent AVR at [facility] in July  2013 with placement of a 21 mm bioprosthetic valve.  She has been followed at [facility] by Dr. Sharlet Salina. Cardiac cath in 2008 with no evidence of CAD.   She has no chest pain or dyspnea. No palpitations. No LE edema. She feels great. Occasional back pain.   Primary Care Physician: Steele Sizer, MD  Past Medical History:  Diagnosis Date  . Arthritis   . Asthma   . B12 deficiency anemia   . History of colon polyps   . History of kidney stones   . HTN (hypertension)   . Hyperlipidemia   . Left nephrolithiasis   . OSA (obstructive sleep apnea)    STUDY 12 YRS CPAP RX-- BUT NONTOLERANT  . Right ureteral stone   . S/P aortic valve replacement    2013  AT [facility]--- CARDIOLOGIST--  DR CRAWFORD AT [facility]  . Seasonal allergies   . Type 2 diabetes mellitus ([place])     Past Surgical History:  Procedure Laterality Date  . AORTIC VALVE REPLACEMENT  06/2012   [facility]  . APPENDECTOMY  1958  . CHOLECYSTECTOMY  1978  . COLONOSCOPY  05-09-2014  . CYSTOSCOPY/RETROGRADE/URETEROSCOPY Right 07/04/2014   Procedure: CYSTOSCOPY/RETROGRADE/URETEROSCOPY;  Surgeon: Bernestine Amass, MD;  [facility];  Service: Urology;  Laterality: Right;  . EXTRACORPOREAL SHOCK WAVE LITHOTRIPSY Right 06/ 2015     [place]  . HOLMIUM LASER APPLICATION Right 01/08/8656   Procedure: HOLMIUM LASER APPLICATION;  Surgeon: Bernestine Amass, MD;  Location: [facility];  Service: Urology;  Laterality: Right;  . TONSILLECTOMY  1953    Current Outpatient Prescriptions  Medication Sig Dispense Refill  . albuterol (PROAIR HFA) 108 (90 Base) MCG/ACT inhaler Inhale 2 puffs into the lungs every  4 (four) hours as needed for shortness of breath. 1 Inhaler 2  . aspirin 81 MG tablet Take 81 mg by mouth daily.    . Astaxanthin 4 MG CAPS Take by mouth 2 (two) times daily.    . blood glucose meter kit and supplies KIT Dispense based on patient and insurance preference. Use up to four times daily as directed. (FOR ICD-9 250.00, 250.01). 1 each 0  . blood glucose meter kit and supplies     . budesonide (PULMICORT) 0.5 MG/2ML nebulizer solution Take 0.5 mg by nebulization as needed.    . Cholecalciferol (VITAMIN D) 2000 UNITS tablet Take 2,000 Units by mouth daily.      . Coenzyme Q10 (CO Q-10) 100 MG CAPS Take 1 capsule by mouth daily.     . enalapril (VASOTEC) 10 MG tablet Take 1 tablet (10 mg total) by mouth daily. 90 tablet 1  . magnesium oxide (MAG-OX) 400 MG tablet Take by mouth.    . Menaquinone-7 (VITAMIN K2) 40 MCG TABS Take 1 tablet by mouth daily.    . metFORMIN (GLUCOPHAGE) 500 MG tablet Take 1 tablet (500 mg total) by mouth 2 (two) times daily. 180 tablet 1  . Multiple Vitamin (MULTI-VITAMIN DAILY PO) Take by mouth as directed.    . Omega-3 Fatty Acids (FISH OIL) 1000 MG CAPS Take 1 capsule by mouth daily. UAD    .  Probiotic Product (PROBIOTIC DAILY PO) Take 1 capsule by mouth daily.     No current facility-administered medications for this visit.     Allergies  Allergen Reactions  . Cyanocobalamin Other (See Comments)    Other reaction(s): Other (See Comments) Sublingual-unconcious  . Dilaudid [Hydromorphone Hcl] Other (See Comments)    Severe hypotension - pt states that if she is given this medication it will kill her  . Naproxen Shortness Of Breath  . Percocet [Oxycodone-Acetaminophen] Shortness Of Breath    Can tolerate oxy IR  . Latex Hives  . Aspirin Other (See Comments)    Nose bleeds at higher doses  . Nsaids Other (See Comments)    Causes asthma  . Other Swelling    Eye drop preservative  . Rosuvastatin     Social History   Social History  . Marital  status: Divorced    Spouse name: N/A  . Number of children: 2  . Years of education: N/A   Occupational History  . Works for [employer]  . Smoking status: Never Smoker  . Smokeless tobacco: Never Used  . Alcohol use No  . Drug use: No  . Sexual activity: Not Currently   Other Topics Concern  . 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 Heart disease Mother   . Heart disease Father   . Asthma Daughter   . Asthma Maternal Grandfather   . Heart disease Sister   . Heart attack Sister 52  . Colon cancer Neg Hx     Review of Systems:  As stated in the HPI and otherwise negative.   BP 118/60   Pulse 76   Ht 5' 2"  (1.575 m)   Wt 203 lb 3.2 oz (92.2 kg)   SpO2 95%   BMI 37.17 kg/m   Physical Examination: General: Well developed, well nourished, NAD  HEENT: OP clear, mucus membranes moist  SKIN: warm, dry. No rashes. Neuro: No focal deficits  Musculoskeletal: Muscle strength 5/5 all ext  Psychiatric: Mood and affect normal  Neck: No JVD, no carotid bruits, no thyromegaly, no lymphadenopathy.  Lungs:Clear bilaterally, no wheezes, rhonci, crackles Cardiovascular: Regular rate and rhythm. No murmurs, gallops or rubs. Abdomen:Soft. Bowel sounds present. Non-tender.  Extremities: No lower extremity edema. Pulses are 2 + in the bilateral DP/PT.  EKG:  EKG is ordered today. The ekg ordered today demonstrates NSR, rate 74 bpm.   Recent Labs: 03/27/2017: ALT 13; BUN 17; Creat 0.77; Hemoglobin 12.6; Platelets 189; Potassium 4.6; Sodium 143; TSH 2.09   Lipid Panel    Component Value Date/Time   CHOL 219 (H) 08/21/2016 1004   CHOL 225 (H) 08/24/2015 1023   TRIG 118 08/21/2016 1004   HDL 59 08/21/2016 1004   HDL 45 08/24/2015 1023   CHOLHDL 3.7 08/21/2016 1004   VLDL 24 08/21/2016 1004   LDLCALC  136 (H) 08/21/2016 1004   LDLCALC 149 (H) 08/24/2015 1023   LDLDIRECT 162.7 06/18/2007 1534     Wt Readings from Last 3 Encounters:  04/23/17 203 lb 3.2 oz (92.2 kg)  03/27/17 204 lb 3.2 oz (92.6 kg)  01/20/17 206 lb 6.4 oz (93.6 kg)     Other studies Reviewed: Additional studies/ records that were reviewed today include: . Review of the above records demonstrates:  Assessment and Plan:   1. Aortic valve stenosis: She is s/p aortic valve replacement with placement of a 21 mm bioprosthetic AVR at [facility] in 2013. Will arrange echo now to assess valve, LVEF. She should have antibiotic prophylaxis before dental visits/procedures.   2. HTN: BP is controlled. No changes.   3. Carotid artery stenosis: followed by [facility]  Current medicines are reviewed at length with the patient today.  The patient does not have concerns regarding medicines.  The following changes have been made:  no change  Labs/ tests ordered today include:   Orders Placed This Encounter  Procedures  . EKG 12-Lead  . ECHOCARDIOGRAM COMPLETE    Disposition:   FU with me in 12 months  Signed, Lauree Chandler, MD 04/23/2017 10:38 AM    [facility] [address], [place],   [address] Phone: [phone]; Fax: [phone]

## 2017-04-23 NOTE — Patient Instructions (Signed)
Medication Instructions:  Your physician recommends that you continue on your current medications as directed. Please refer to the Current Medication list given to you today.   Labwork: None today  Testing/Procedures: Your physician has requested that you have an echocardiogram. Echocardiography is a painless test that uses sound waves to create images of your heart. It provides your doctor with information about the size and shape of your heart and how well your heart's chambers and valves are working. This procedure takes approximately one hour. There are no restrictions for this procedure.    Follow-Up: Your physician recommends that you schedule a follow-up appointment in: 12 months. Please call our office in about 9 months to schedule this appointment    Any Other Special Instructions Will Be Listed Below (If Applicable).     If you need a refill on your cardiac medications before your next appointment, please call your pharmacy.

## 2017-05-07 ENCOUNTER — Other Ambulatory Visit: Payer: Self-pay

## 2017-05-07 ENCOUNTER — Ambulatory Visit (HOSPITAL_COMMUNITY): Payer: Medicare Other | Attending: Cardiovascular Disease

## 2017-05-07 DIAGNOSIS — I350 Nonrheumatic aortic (valve) stenosis: Secondary | ICD-10-CM | POA: Diagnosis not present

## 2017-05-07 DIAGNOSIS — I359 Nonrheumatic aortic valve disorder, unspecified: Secondary | ICD-10-CM

## 2017-05-07 MED ORDER — PERFLUTREN LIPID MICROSPHERE
1.0000 mL | INTRAVENOUS | Status: AC | PRN
  Administered 2017-05-07: 2 mL via INTRAVENOUS

## 2017-05-15 ENCOUNTER — Telehealth: Payer: Self-pay | Admitting: Cardiovascular Disease

## 2017-05-15 ENCOUNTER — Ambulatory Visit: Payer: Self-pay | Admitting: Family Medicine

## 2017-05-15 NOTE — Telephone Encounter (Signed)
Pt is aware of MD review of MV that the Mitral valve is working well,  and   that is nothing to do now  will re do echo in a year. Pt verbalized understanding.

## 2017-05-15 NOTE — Telephone Encounter (Signed)
New message      Returning a call to the nurse to get echo results.  Also since the weekend, pt has had intermittent "sharp" pains "shooting" on the  left side of her head.  Not sure if it is cardiology related.  Pt will also call her PCP.

## 2017-05-15 NOTE — Telephone Encounter (Signed)
Pt called for the echo results. Pt results given. Pt states that she had read the results, and She is concern about the mitral valve's severely calcified and severely thickened. Pt would like to know what recommendations Dr Angelena Form has for this result. Pt also said that she has been having intermittent pain on her left side on her brain and she has dizziness occasionally. Pt said that she called her PCP, but she wanted to let her cardiologist know. Pt is aware that this message will be send to MD, and  She will be called back when Dr Angelena Form  respond. Pt verbalized understanding.

## 2017-05-15 NOTE — Telephone Encounter (Signed)
Her mitral valve leaflets and the area around her valve are calcified but the mitral valve is still working well. Nothing to do about this at this time. We will follow with another echo in one year. Bailey [name]

## 2017-05-16 DIAGNOSIS — Z6836 Body mass index (BMI) 36.0-36.9, adult: Secondary | ICD-10-CM | POA: Diagnosis not present

## 2017-05-16 DIAGNOSIS — M2550 Pain in unspecified joint: Secondary | ICD-10-CM | POA: Diagnosis not present

## 2017-05-16 DIAGNOSIS — M154 Erosive (osteo)arthritis: Secondary | ICD-10-CM | POA: Diagnosis not present

## 2017-05-16 DIAGNOSIS — E669 Obesity, unspecified: Secondary | ICD-10-CM | POA: Diagnosis not present

## 2017-05-16 DIAGNOSIS — M064 Inflammatory polyarthropathy: Secondary | ICD-10-CM | POA: Diagnosis not present

## 2017-05-16 DIAGNOSIS — H0520 Unspecified exophthalmos: Secondary | ICD-10-CM | POA: Diagnosis not present

## 2017-05-16 DIAGNOSIS — G4485 Primary stabbing headache: Secondary | ICD-10-CM | POA: Diagnosis not present

## 2017-05-19 ENCOUNTER — Encounter: Payer: Self-pay | Admitting: Family Medicine

## 2017-05-19 ENCOUNTER — Ambulatory Visit (INDEPENDENT_AMBULATORY_CARE_PROVIDER_SITE_OTHER): Payer: Medicare Other | Admitting: Family Medicine

## 2017-05-19 VITALS — BP 106/68 | HR 88 | Temp 97.9°F | Resp 16 | Wt 202.1 lb

## 2017-05-19 DIAGNOSIS — H9202 Otalgia, left ear: Secondary | ICD-10-CM

## 2017-05-19 DIAGNOSIS — I6523 Occlusion and stenosis of bilateral carotid arteries: Secondary | ICD-10-CM | POA: Diagnosis not present

## 2017-05-19 DIAGNOSIS — G44209 Tension-type headache, unspecified, not intractable: Secondary | ICD-10-CM

## 2017-05-19 DIAGNOSIS — R42 Dizziness and giddiness: Secondary | ICD-10-CM | POA: Diagnosis not present

## 2017-05-19 NOTE — Progress Notes (Signed)
Name: Bailey [name]   MRN: [id number]    DOB: Jul 10, 1946   Date:05/19/2017       Progress Note  Subjective  Chief Complaint  Chief Complaint  Patient presents with  . Headache    for about a week sharp pain on left side of head towards back thinks B/P meds may be too high                                           HPI  Headache/balance problems/otalgia: she noticed an ice pick headache on bitemporal areas about 1 week ago, it was intermittent for about 3 days. She contacted her Rheumatologist and was seen there on June 8th. They did not think related to temporal arteritis. She was given reassurance. She has contacted her ENT and audiologist since, and has appointments scheduled. She states left otalgia has also improved. She has been feeling tired lately and would like further thyroid testing, but will call me with the name of exams she wants to have done.   She is worried about blood pressure being low, and will decrease dose to half pill daily   Patient Active Problem List   Diagnosis Date Noted  . OSA (obstructive sleep apnea) 01/20/2017  . Mild cognitive impairment 01/20/2017  . Carotid atherosclerosis, bilateral 01/20/2017  . Abnormal brain MRI 08/26/2016  . Inflammatory polyarthritis ([place]) 11/12/2015  . Erosive osteoarthritis of hand 11/12/2015  . History of vitamin D deficiency 11/06/2015  . Hearing loss 08/24/2015  . Asthma, mild intermittent 08/23/2015  . Arthritis, degenerative 08/23/2015  . Dyslipidemia 08/23/2015  . H/O: hypothyroidism 08/23/2015  . Obesity (BMI 30-39.9) 08/23/2015  . HZV (herpes zoster virus) post herpetic neuralgia 08/23/2015  . History of renal stone 04/01/2014  . Diabetes mellitus, controlled ([place]) 04/01/2014  . Essential hypertension, benign 04/01/2014  . H/O prosthetic heart valve 06/15/2012  . CD (contact dermatitis) 07/25/2008    Past Surgical History:  Procedure Laterality Date  . AORTIC VALVE REPLACEMENT  06/2012   [facility]  .  APPENDECTOMY  1958  . CHOLECYSTECTOMY  1978  . COLONOSCOPY  05-09-2014  . CYSTOSCOPY/RETROGRADE/URETEROSCOPY Right 07/04/2014   Procedure: CYSTOSCOPY/RETROGRADE/URETEROSCOPY;  Surgeon: Bernestine Amass, MD;  Location: [facility];  Service: Urology;  Laterality: Right;  . EXTRACORPOREAL SHOCK WAVE LITHOTRIPSY Right 06/ 2015     [place]  . HOLMIUM LASER APPLICATION Right 04/05/7680   Procedure: HOLMIUM LASER APPLICATION;  Surgeon: Bernestine Amass, MD;  Location: [facility];  Service: Urology;  Laterality: Right;  . TONSILLECTOMY  1953    Family History  Problem Relation Age of Onset  . Heart disease Mother   . Heart disease Father   . Asthma Daughter   . Asthma Maternal Grandfather   . Heart disease Sister   . Heart attack Sister 29  . Colon cancer Neg Hx     Social History   Social History  . Marital status: Divorced    Spouse name: N/A  . Number of children: 2  . Years of education: N/A   Occupational History  . Works for [employer]  . Smoking status: Never Smoker  . Smokeless tobacco: Never Used  . Alcohol use No  . Drug use: No  . Sexual activity: Not Currently   Other Topics Concern  . Not on file  Social History Narrative   Divorced, 2 children. Works as a Counsellor. Drinks 2-3 caffeinated beverages/day     Current Outpatient Prescriptions:  .  albuterol (PROAIR HFA) 108 (90 Base) MCG/ACT inhaler, Inhale 2 puffs into the lungs every 4 (four) hours as needed for shortness of breath., Disp: 1 Inhaler, Rfl: 2 .  aspirin 81 MG tablet, Take 81 mg by mouth daily., Disp: , Rfl:  .  Astaxanthin 4 MG CAPS, Take by mouth 2 (two) times daily., Disp: , Rfl:  .  blood glucose meter kit and supplies KIT, Dispense based on patient and insurance preference. Use up to four times daily as directed. (FOR ICD-9 250.00, 250.01)., Disp: 1 each, Rfl: 0 .  blood glucose meter kit and supplies, , Disp: , Rfl:  .   budesonide (PULMICORT) 0.5 MG/2ML nebulizer solution, Take 0.5 mg by nebulization as needed., Disp: , Rfl:  .  Cholecalciferol (VITAMIN D) 2000 UNITS tablet, Take 2,000 Units by mouth daily.  , Disp: , Rfl:  .  Coenzyme Q10 (CO Q-10) 100 MG CAPS, Take 1 capsule by mouth daily. , Disp: , Rfl:  .  enalapril (VASOTEC) 10 MG tablet, Take 1 tablet (10 mg total) by mouth daily., Disp: 90 tablet, Rfl: 1 .  magnesium oxide (MAG-OX) 400 MG tablet, Take by mouth., Disp: , Rfl:  .  Menaquinone-7 (VITAMIN K2) 40 MCG TABS, Take 1 tablet by mouth daily., Disp: , Rfl:  .  metFORMIN (GLUCOPHAGE) 500 MG tablet, Take 1 tablet (500 mg total) by mouth 2 (two) times daily., Disp: 180 tablet, Rfl: 1 .  Multiple Vitamin (MULTI-VITAMIN DAILY PO), Take by mouth as directed., Disp: , Rfl:  .  Omega-3 Fatty Acids (FISH OIL) 1000 MG CAPS, Take 1 capsule by mouth daily. UAD, Disp: , Rfl:  .  Probiotic Product (PROBIOTIC DAILY PO), Take 1 capsule by mouth daily., Disp: , Rfl:   Allergies  Allergen Reactions  . Cyanocobalamin Other (See Comments)    Other reaction(s): Other (See Comments) Sublingual-unconcious  . Dilaudid [Hydromorphone Hcl] Other (See Comments)    Severe hypotension - pt states that if she is given this medication it will kill her  . Naproxen Shortness Of Breath  . Percocet [Oxycodone-Acetaminophen] Shortness Of Breath    Can tolerate oxy IR  . Latex Hives  . Aspirin Other (See Comments)    Nose bleeds at higher doses  . Nsaids Other (See Comments)    Causes asthma  . Other Swelling    Eye drop preservative  . Rosuvastatin      ROS  Ten systems reviewed and is negative except as mentioned in HPI   Objective  Vitals:   05/19/17 1150  BP: 106/68  Pulse: 88  Resp: 16  Temp: 97.9 F (36.6 C)  SpO2: 97%  Weight: 202 lb 1 oz (91.7 kg)    Body mass index is 36.96 kg/m.  Physical Exam  Constitutional: Patient appears well-developed and well-nourished. Obese  No distress.  HEENT:  head atraumatic, normocephalic, pupils equal and reactive to light, ears cerumen in both ear canals, she will see audiologist this upcoming week, neck supple, throat within normal limits Cardiovascular: Normal rate, regular rhythm and normal heart sounds.  No murmur heard. No BLE edema. Pulmonary/Chest: Effort normal and breath sounds normal. No respiratory distress. Abdominal: Soft.  There is no tenderness. Neurological: no focal findings, no nystagmus, normal gait Psychiatric: Patient has a normal mood and affect. behavior is normal. Judgment and thought content normal.  Recent Results (from the past 2160 hour(s))  Thyroid Panel With TSH     Status: None   Collection Time: 03/27/17  9:04 AM  Result Value Ref Range   T4, Total 8.0 4.5 - 12.0 ug/dL   T3 Uptake 29 22 - 35 %   Free Thyroxine Index 2.3 1.4 - 3.8   TSH 2.09 mIU/L    Comment:   Reference Range   > or = 20 Years  0.40-4.50   Pregnancy Range First trimester  0.26-2.66 Second trimester 0.55-2.73 Third trimester  0.43-2.91     COMPLETE METABOLIC PANEL WITH GFR     Status: Abnormal   Collection Time: 03/27/17  9:54 AM  Result Value Ref Range   Sodium 143 135 - 146 mmol/L   Potassium 4.6 3.5 - 5.3 mmol/L   Chloride 104 98 - 110 mmol/L   CO2 27 20 - 31 mmol/L   Glucose, Bld 104 (H) 65 - 99 mg/dL   BUN 17 7 - 25 mg/dL   Creat 0.77 0.60 - 0.93 mg/dL    Comment:   For patients > or = 70 years of age: The upper reference limit for Creatinine is approximately 13% higher for people identified as African-American.      Total Bilirubin 0.4 0.2 - 1.2 mg/dL   Alkaline Phosphatase 38 33 - 130 U/L   AST 17 10 - 35 U/L   ALT 13 6 - 29 U/L   Total Protein 6.7 6.1 - 8.1 g/dL   Albumin 4.0 3.6 - 5.1 g/dL   Calcium 9.5 8.6 - 10.4 mg/dL   GFR, Est African American >89 >=60 mL/min   GFR, Est Non African American 78 >=60 mL/min  CBC with Differential/Platelet     Status: None   Collection Time: 03/27/17  9:54 AM  Result Value  Ref Range   WBC 6.3 3.8 - 10.8 K/uL   RBC 4.50 3.80 - 5.10 MIL/uL   Hemoglobin 12.6 11.7 - 15.5 g/dL   HCT 39.4 35.0 - 45.0 %   MCV 87.6 80.0 - 100.0 fL   MCH 28.0 27.0 - 33.0 pg   MCHC 32.0 32.0 - 36.0 g/dL   RDW 14.9 11.0 - 15.0 %   Platelets 189 140 - 400 K/uL   MPV 10.2 7.5 - 12.5 fL   Neutro Abs 3,717 1,500 - 7,800 cells/uL   Lymphs Abs 1,638 850 - 3,900 cells/uL   Monocytes Absolute 567 200 - 950 cells/uL   Eosinophils Absolute 315 15 - 500 cells/uL   Basophils Absolute 63 0 - 200 cells/uL   Neutrophils Relative % 59 %   Lymphocytes Relative 26 %   Monocytes Relative 9 %   Eosinophils Relative 5 %   Basophils Relative 1 %   Smear Review Criteria for review not met   Lipase     Status: None   Collection Time: 03/27/17  9:54 AM  Result Value Ref Range   Lipase 16 7 - 60 U/L  H. pylori breath test     Status: None   Collection Time: 03/27/17  9:54 AM  Result Value Ref Range   H. pylori Breath Test NOT DETECTED Not Detected    Comment:   Antimicrobials, proton pump inhibitors, and bismuth preparations are known to suppress H. pylori, and ingestion of these prior to H. pylori diagnostic testing may lead to false negative results. If clinically indicated, the test may be repeated on a new specimen obtained two weeks after discontinuing treatment.  PHQ2/9: Depression screen [facility] 2/9 03/27/2017 08/21/2016 08/24/2015 07/13/2012  Decreased Interest 0 0 0 0  Down, Depressed, Hopeless 0 0 0 0  PHQ - 2 Score 0 0 0 0     Fall Risk: Fall Risk  03/27/2017 08/21/2016 08/24/2015  Falls in the past year? No No No     Assessment & Plan  1. Acute non intractable tension-type headache  She was seen by Rheumatologist and given reassurance, pain resolved Discussed CVA symptoms and importance of going to [facility] if symptoms returns and are severe.   2. Otalgia, left  Likely from cerumen impaction, she will see audiologist for removal  3. Vertigo  She has a follow up with  ENT

## 2017-05-21 ENCOUNTER — Other Ambulatory Visit: Payer: Self-pay

## 2017-05-23 ENCOUNTER — Encounter: Payer: Self-pay | Admitting: Vascular Surgery

## 2017-06-04 ENCOUNTER — Ambulatory Visit (HOSPITAL_COMMUNITY): Payer: Medicare Other

## 2017-06-04 ENCOUNTER — Ambulatory Visit: Payer: Medicare Other | Admitting: Vascular Surgery

## 2017-06-10 ENCOUNTER — Ambulatory Visit: Payer: Self-pay | Admitting: Neurology

## 2017-07-10 ENCOUNTER — Emergency Department (HOSPITAL_COMMUNITY)
Admission: EM | Admit: 2017-07-10 | Discharge: 2017-07-10 | Disposition: A | Discharge status: Home / Self Care | Payer: Medicare Other | Attending: Emergency Medicine | Admitting: Emergency Medicine

## 2017-07-10 ENCOUNTER — Encounter (HOSPITAL_COMMUNITY): Payer: Self-pay

## 2017-07-10 ENCOUNTER — Emergency Department (HOSPITAL_COMMUNITY): Payer: Medicare Other

## 2017-07-10 DIAGNOSIS — J45909 Unspecified asthma, uncomplicated: Secondary | ICD-10-CM | POA: Insufficient documentation

## 2017-07-10 DIAGNOSIS — M62838 Other muscle spasm: Secondary | ICD-10-CM | POA: Diagnosis not present

## 2017-07-10 DIAGNOSIS — E785 Hyperlipidemia, unspecified: Secondary | ICD-10-CM | POA: Diagnosis not present

## 2017-07-10 DIAGNOSIS — E119 Type 2 diabetes mellitus without complications: Secondary | ICD-10-CM | POA: Diagnosis not present

## 2017-07-10 DIAGNOSIS — I10 Essential (primary) hypertension: Secondary | ICD-10-CM | POA: Diagnosis not present

## 2017-07-10 DIAGNOSIS — Z7982 Long term (current) use of aspirin: Secondary | ICD-10-CM | POA: Diagnosis not present

## 2017-07-10 DIAGNOSIS — Z91040 Latex allergy status: Secondary | ICD-10-CM | POA: Diagnosis not present

## 2017-07-10 DIAGNOSIS — Z79899 Other long term (current) drug therapy: Secondary | ICD-10-CM | POA: Diagnosis not present

## 2017-07-10 DIAGNOSIS — R079 Chest pain, unspecified: Secondary | ICD-10-CM | POA: Diagnosis not present

## 2017-07-10 LAB — I-STAT CHEM 8, ED
BUN: 17 mg/dL (ref 6–20)
CALCIUM ION: 1.16 mmol/L (ref 1.15–1.40)
CHLORIDE: 104 mmol/L (ref 101–111)
CREATININE: 0.6 mg/dL (ref 0.44–1.00)
GLUCOSE: 115 mg/dL — AB (ref 65–99)
HCT: 33 % — ABNORMAL LOW (ref 36.0–46.0)
HEMOGLOBIN: 11.2 g/dL — AB (ref 12.0–15.0)
Potassium: 4 mmol/L (ref 3.5–5.1)
Sodium: 139 mmol/L (ref 135–145)
TCO2: 26 mmol/L (ref 0–100)

## 2017-07-10 LAB — I-STAT TROPONIN, ED
Troponin i, poc: 0 ng/mL (ref 0.00–0.08)
Troponin i, poc: 0 ng/mL (ref 0.00–0.08)

## 2017-07-10 LAB — CBC WITH DIFFERENTIAL/PLATELET
BASOS ABS: 0 10*3/uL (ref 0.0–0.1)
Basophils Relative: 0 %
Eosinophils Absolute: 0.2 10*3/uL (ref 0.0–0.7)
Eosinophils Relative: 2 %
HEMATOCRIT: 35.3 % — AB (ref 36.0–46.0)
Hemoglobin: 11.3 g/dL — ABNORMAL LOW (ref 12.0–15.0)
LYMPHS ABS: 1.8 10*3/uL (ref 0.7–4.0)
LYMPHS PCT: 22 %
MCH: 27.8 pg (ref 26.0–34.0)
MCHC: 32 g/dL (ref 30.0–36.0)
MCV: 86.9 fL (ref 78.0–100.0)
MONO ABS: 0.9 10*3/uL (ref 0.1–1.0)
Monocytes Relative: 11 %
NEUTROS ABS: 5.3 10*3/uL (ref 1.7–7.7)
Neutrophils Relative %: 65 %
Platelets: 200 10*3/uL (ref 150–400)
RBC: 4.06 MIL/uL (ref 3.87–5.11)
RDW: 13.9 % (ref 11.5–15.5)
WBC: 8.1 10*3/uL (ref 4.0–10.5)

## 2017-07-10 NOTE — ED Triage Notes (Signed)
Patient awoke about 3am with CP radiating into her neck, 10/10. Was given 324 asa and 1 SL nitro and now states that [pain level is 4/10.

## 2017-07-10 NOTE — ED Provider Notes (Signed)
[facility]-EMERGENCY DEPT Provider Note   CSN: [id number] Arrival date & time: 07/10/17  0431     History   Chief Complaint Chief Complaint  Patient presents with  . Chest Pain    HPI Bailey [name] is a 71 y.o. female.  The history is provided by the patient.  Chest Pain   This is a new problem. The current episode started 3 to 5 hours ago. The problem occurs constantly. The problem has been resolved. The pain is associated with rest. Pain location: neck and shoulder. The pain is moderate. Quality: cramping. The pain does not radiate. Pertinent negatives include no diaphoresis, no dizziness, no exertional chest pressure, no lower extremity edema, no palpitations, no shortness of breath, no vomiting and no weakness. She has tried nothing for the symptoms. The treatment provided significant relief. Risk factors include being elderly.  Pertinent negatives for past medical history include no Kawasaki disease.  Pertinent negatives for family medical history include: no Marfan's syndrome.  Procedure history is negative for EPS study.  She says she has cramps, this one just lasted longer so she came in.  No doe, no n/v/d.    Past Medical History:  Diagnosis Date  . Arthritis   . Asthma   . B12 deficiency anemia   . History of colon polyps   . History of kidney stones   . HTN (hypertension)   . Hyperlipidemia   . Left nephrolithiasis   . OSA (obstructive sleep apnea)    STUDY 12 YRS CPAP RX-- BUT NONTOLERANT  . Right ureteral stone   . S/P aortic valve replacement    2013  AT [facility]--- CARDIOLOGIST--  DR CRAWFORD AT [facility]  . Seasonal allergies   . Type 2 diabetes mellitus [facility])     Patient Active Problem List   Diagnosis Date Noted  . OSA (obstructive sleep apnea) 01/20/2017  . Mild cognitive impairment 01/20/2017  . Carotid atherosclerosis, bilateral 01/20/2017  . Abnormal brain MRI 08/26/2016  . Inflammatory polyarthritis ([facility]) 11/12/2015  . Erosive osteoarthritis of hand  11/12/2015  . History of vitamin D deficiency 11/06/2015  . Hearing loss 08/24/2015  . Asthma, mild intermittent 08/23/2015  . Arthritis, degenerative 08/23/2015  . Dyslipidemia 08/23/2015  . H/O: hypothyroidism 08/23/2015  . Obesity (BMI 30-39.9) 08/23/2015  . HZV (herpes zoster virus) post herpetic neuralgia 08/23/2015  . History of renal stone 04/01/2014  . Diabetes mellitus, controlled ([facility]) 04/01/2014  . Essential hypertension, benign 04/01/2014  . H/O prosthetic heart valve 06/15/2012  . CD (contact dermatitis) 07/25/2008    Past Surgical History:  Procedure Laterality Date  . AORTIC VALVE REPLACEMENT  06/2012   [facility]  . APPENDECTOMY  1958  . CHOLECYSTECTOMY  1978  . COLONOSCOPY  05-09-2014  . CYSTOSCOPY/RETROGRADE/URETEROSCOPY Right 07/04/2014   Procedure: CYSTOSCOPY/RETROGRADE/URETEROSCOPY;  Surgeon: Valetta Fuller, MD;  [facility];  Service: Urology;  Laterality: Right;  . EXTRACORPOREAL SHOCK WAVE LITHOTRIPSY Right 06/ 2015     [place]  . HOLMIUM LASER APPLICATION Right 07/04/2014   Procedure: HOLMIUM LASER APPLICATION;  Surgeon: Valetta Fuller, MD;  Location: [facility];  Service: Urology;  Laterality: Right;  . TONSILLECTOMY  1953    OB History    No data available       Home Medications    Prior to Admission medications   Medication Sig Start Date End Date Taking? Authorizing Provider  albuterol (PROAIR HFA) 108 (90 Base) MCG/ACT inhaler Inhale 2 puffs into the lungs every  4 (four) hours as needed for shortness of breath. 12/06/15   Steele Sizer, MD  aspirin 81 MG tablet Take 81 mg by mouth daily.    [provider]  Astaxanthin 4 MG CAPS Take by mouth 2 (two) times daily.    [provider]  blood glucose meter kit and supplies KIT Dispense based on patient and insurance preference. Use up to four times daily as directed. (FOR ICD-9 250.00, 250.01). 08/24/15   Steele Sizer, MD  blood glucose meter  kit and supplies  09/26/11   [provider]  budesonide (PULMICORT) 0.5 MG/2ML nebulizer solution Take 0.5 mg by nebulization as needed.    [provider]  Cholecalciferol (VITAMIN D) 2000 UNITS tablet Take 2,000 Units by mouth daily.      [provider]  Coenzyme Q10 (CO Q-10) 100 MG CAPS Take 1 capsule by mouth daily.     [provider]  enalapril (VASOTEC) 10 MG tablet Take 1 tablet (10 mg total) by mouth daily. 03/27/17   Steele Sizer, MD  magnesium oxide (MAG-OX) 400 MG tablet Take by mouth.    [provider]  Menaquinone-7 (VITAMIN K2) 40 MCG TABS Take 1 tablet by mouth daily.    [provider]  metFORMIN (GLUCOPHAGE) 500 MG tablet Take 1 tablet (500 mg total) by mouth 2 (two) times daily. 03/27/17   Steele Sizer, MD  Multiple Vitamin (MULTI-VITAMIN DAILY PO) Take by mouth as directed.    [provider]  Omega-3 Fatty Acids (FISH OIL) 1000 MG CAPS Take 1 capsule by mouth daily. UAD    [provider]  Probiotic Product (PROBIOTIC DAILY PO) Take 1 capsule by mouth daily.    [provider]    Family History Family History  Problem Relation Age of Onset  . Heart disease Mother   . Heart disease Father   . Asthma Daughter   . Asthma Maternal Grandfather   . Heart disease Sister   . Heart attack Sister 20  . Colon cancer Neg Hx     Social History Social History  Substance Use Topics  . Smoking status: Never Smoker  . Smokeless tobacco: Never Used  . Alcohol use No     Allergies   Cyanocobalamin; Dilaudid [hydromorphone hcl]; Naproxen; Percocet [oxycodone-acetaminophen]; Latex; Aspirin; Nsaids; Other; and Rosuvastatin   Review of Systems Review of Systems  Constitutional: Negative for diaphoresis.  Respiratory: Negative for shortness of breath.   Cardiovascular: Positive for chest pain. Negative for palpitations and leg swelling.  Gastrointestinal: Negative for vomiting.    Neurological: Negative for dizziness and weakness.  All other systems reviewed and are negative.    Physical Exam Updated Vital Signs BP (!) 108/50   Pulse 90   Temp 97.6 F (36.4 C) (Oral)   Ht '5\' 1"'$  (1.549 m)   Wt 90.7 kg (200 lb)   SpO2 95%   BMI 37.79 kg/m   Physical Exam  Constitutional: She is oriented to person, place, and time. She appears well-developed and well-nourished. No distress.  HENT:  Head: Normocephalic and atraumatic.  Mouth/Throat: No oropharyngeal exudate.  Eyes: Pupils are equal, round, and reactive to light. Conjunctivae are normal.  Neck: Normal range of motion. Neck supple. No JVD present.  Cardiovascular: Normal rate, regular rhythm, normal heart sounds and intact distal pulses.   Pulmonary/Chest: Effort normal and breath sounds normal. She has no wheezes. She has no rales.  Abdominal: Soft. Bowel sounds are normal. She exhibits no mass. There  is no tenderness. There is no rebound and no guarding.  Musculoskeletal: Normal range of motion. She exhibits no edema or tenderness.  Lymphadenopathy:    She has no cervical adenopathy.  Neurological: She is alert and oriented to person, place, and time.  Skin: Skin is warm and dry. Capillary refill takes less than 2 seconds.  Psychiatric: She has a normal mood and affect.     ED Treatments / Results  Labs (all labs ordered are listed, but only abnormal results are displayed) Labs Reviewed  CBC WITH DIFFERENTIAL/PLATELET  I-STAT CHEM 8, ED  I-STAT TROPONIN, ED    EKG  EKG Interpretation  Date/Time:  Thursday July 10 2017 04:42:58 EDT Ventricular Rate:  73 PR Interval:    QRS Duration: 82 QT Interval:  411 QTC Calculation: 453 R Axis:   47 Text Interpretation:  Sinus rhythm Confirmed by Dory Horn) on 07/10/2017 4:50:14 AM       Radiology Dg Chest 2 View  Result Date: 07/10/2017 CLINICAL DATA:  Chest pain tonight. EXAM: CHEST  2 VIEW COMPARISON:  04/26/2009 FINDINGS:  Postoperative changes in the mediastinum. Normal heart size and pulmonary vascularity. No focal airspace disease or consolidation in the lungs. No blunting of costophrenic angles. No pneumothorax. Mediastinal contours appear intact. Degenerative changes in the spine. IMPRESSION: No evidence of active pulmonary disease. Electronically Signed   By: Lucienne Capers M.D.   On: 07/10/2017 05:22    Procedures Procedures (including critical care time)   Final Clinical Impressions(s) / ED Diagnoses  Muscle cramp:  Patient feels this pain is consistent with her known spasms.  It resolved with magnesium.  NTG and ASA made it worse per the patient.  It has resolved.  She understands that she must call her cardiologist and follow up closely. Ruled out for Mi in the ED with normal EKG and 2 negative troponins.  HEART score 1 very low risk for MACE and symptoms are highly atypical, reproducible. Strict return precautions given for weakness, inability to tolerate oral medication, Exertional chest pain, shortness of breath, diaphoresis, leg pain and swelling, syncope or any concerns. No signs of systemic illness or infection. The patient is nontoxic-appearing on exam and vital signs are within normal limits.   I have reviewed the triage vital signs and the nursing notes. Pertinent labs &imaging results that were available during my care of the patient were reviewed by me and considered in my medical decision making (see chart for details).  After history, exam, and medical workup I feel the patient has been appropriately medically screened and is safe for discharge home. Pertinent diagnoses were discussed with the patient. Patient was given return precautions.     Jaydeen Darley, MD 07/10/17 0700

## 2017-07-17 DIAGNOSIS — M2550 Pain in unspecified joint: Secondary | ICD-10-CM | POA: Diagnosis not present

## 2017-07-17 DIAGNOSIS — R51 Headache: Secondary | ICD-10-CM | POA: Diagnosis not present

## 2017-07-17 DIAGNOSIS — Z6835 Body mass index (BMI) 35.0-35.9, adult: Secondary | ICD-10-CM | POA: Diagnosis not present

## 2017-07-17 DIAGNOSIS — E669 Obesity, unspecified: Secondary | ICD-10-CM | POA: Diagnosis not present

## 2017-07-17 DIAGNOSIS — M154 Erosive (osteo)arthritis: Secondary | ICD-10-CM | POA: Diagnosis not present

## 2017-07-22 ENCOUNTER — Encounter: Payer: Self-pay | Admitting: Vascular Surgery

## 2017-07-25 ENCOUNTER — Ambulatory Visit (INDEPENDENT_AMBULATORY_CARE_PROVIDER_SITE_OTHER): Payer: Medicare Other | Admitting: Family Medicine

## 2017-07-25 ENCOUNTER — Encounter: Payer: Self-pay | Admitting: Family Medicine

## 2017-07-25 VITALS — BP 122/64 | HR 88 | Temp 97.6°F | Resp 16 | Ht 61.0 in | Wt 191.2 lb

## 2017-07-25 DIAGNOSIS — R0789 Other chest pain: Secondary | ICD-10-CM | POA: Diagnosis not present

## 2017-07-25 DIAGNOSIS — Z952 Presence of prosthetic heart valve: Secondary | ICD-10-CM

## 2017-07-25 DIAGNOSIS — G4733 Obstructive sleep apnea (adult) (pediatric): Secondary | ICD-10-CM

## 2017-07-25 DIAGNOSIS — J40 Bronchitis, not specified as acute or chronic: Secondary | ICD-10-CM

## 2017-07-25 DIAGNOSIS — R079 Chest pain, unspecified: Secondary | ICD-10-CM

## 2017-07-25 DIAGNOSIS — M353 Polymyalgia rheumatica: Secondary | ICD-10-CM | POA: Diagnosis not present

## 2017-07-25 DIAGNOSIS — E119 Type 2 diabetes mellitus without complications: Secondary | ICD-10-CM | POA: Diagnosis not present

## 2017-07-25 DIAGNOSIS — I10 Essential (primary) hypertension: Secondary | ICD-10-CM | POA: Diagnosis not present

## 2017-07-25 DIAGNOSIS — Z1211 Encounter for screening for malignant neoplasm of colon: Secondary | ICD-10-CM | POA: Diagnosis not present

## 2017-07-25 DIAGNOSIS — E1169 Type 2 diabetes mellitus with other specified complication: Secondary | ICD-10-CM

## 2017-07-25 DIAGNOSIS — I6523 Occlusion and stenosis of bilateral carotid arteries: Secondary | ICD-10-CM

## 2017-07-25 DIAGNOSIS — E785 Hyperlipidemia, unspecified: Secondary | ICD-10-CM

## 2017-07-25 LAB — POCT UA - MICROALBUMIN: MICROALBUMIN (UR) POC: 20 mg/L

## 2017-07-25 LAB — POCT GLYCOSYLATED HEMOGLOBIN (HGB A1C): HEMOGLOBIN A1C: 5.5

## 2017-07-25 MED ORDER — AZITHROMYCIN 250 MG PO TABS: ORAL_TABLET | ORAL | 0 refills | Status: DC

## 2017-07-25 NOTE — Progress Notes (Signed)
Name: Bailey [name]   MRN: [id number]    DOB: 1946-01-09   Date:07/25/2017       Progress Note  Subjective  Chief Complaint  Chief Complaint  Patient presents with  . Medication Refill    4 month F/U  . Diabetes    Average-110 Highest-140  . Hypertension  . Asthma    SOB and wheezing, congestion  . Dyslipidemia  . Ear Fullness    Right ear pressure    HPI   DMII: she has lost 11 lbs since last visit, 2 months ago. She has been doing intermittent fasting - eating every 16 hours.. She has not been checking her glucose at home but denies polyphagia, polydipsia or polyuria.. Last hgbA1C was 6.0% down to 5.5%. She is doing HIIT 4 times daily.   Asthma Mild Intermittent: she is using Pulmicort prn,not recently albuterol occasionally, she states this week she has noticed a flare, productive cough - today ( previously dry) , feels full in her chest, SOB  HTN: taking medication and denies side effects of medication, bp is at goal, no dizziness, chest pain or palpitation.   Obesity: she lost 11lbs since last visit. She has changed the way she eats and has been doing exercises 4 times daily at home  Dyslipidemia: unable to tolerate statin therapy   Hearing loss: seen by ENT, needs hearing aid but can't afford it at this time  Polymyalgia Rheumatica: seen by Rheumatologist and is currently on low dose prednisone, but still has joint aches all over.   Abdominal pain and LUQ pain and abdominal distension: she has noticed symptoms over the past 6 months, happens intermittently about twice per month. Described as very intense and sharp in the beginning, but after that is a sore feeling for a couple of days, it comes in waves during episode, no nausea or vomiting during episodes, and denies associated change in frequency, urgency or blood in urine. She has a long history of anemia, present during last labs at [facility], due for colonoscopy and has noticed recent change in bowel movement,  sporadic episodes of lose stools that causes urgency. No blood in stools.   Chest pain: recently went to [facility], woke up during the night with mid back pain, that radiated to left shoulder and left jaw, called 911 went to [facility] and troponin negative, she has been asymptomatic since. Advised follow up with cardiologist.   Headache/right vision loss: headaches started end of Summer 2017 pain was described as sharp pain on right temporal area a couple of times a week, she was seen by me in September because the headache was associated with vision loss on right side that lasted about one minute, she also states she had one episode that she felt like her eyes were moving and she had to pull over while driving that same week. She was given higher dose of prednisone to cover for possible temporal arteritis, and she was referred to Dr. Jamal Collin but biopsy was not done, she was seen by Dr. Manuella Ghazi ( neurologist ) that ordered brain MRI ( reviewed with patient today ) and sleep study. She has OSA and will start CPAP soon. She also had carotid doppler that showed mild atherosclerosis but vascular surgeon ( Dr. Scot Dock ) advised aspirin and statin therapy. She has been taking aspirin daily,  and can't tolerate statins. Discussed injectable option with patient, such as Repatha today.Patient  has polymyalgia rheumatica and got confused with dose of prednisone. Rheumatologist still feels  like Giant Cell arteritis can still be present with normal levels. She is still seeing eye doctor and is supposed to have CT scan of head to look for a tumor., but she wants to get a second opinion first.   Patient Active Problem List   Diagnosis Date Noted  . OSA (obstructive sleep apnea) 01/20/2017  . Mild cognitive impairment 01/20/2017  . Carotid atherosclerosis, bilateral 01/20/2017  . Abnormal brain MRI 08/26/2016  . Inflammatory polyarthritis ([place]) 11/12/2015  . Erosive osteoarthritis of hand 11/12/2015  . History of vitamin D  deficiency 11/06/2015  . Hearing loss 08/24/2015  . Asthma, mild intermittent 08/23/2015  . Arthritis, degenerative 08/23/2015  . Dyslipidemia 08/23/2015  . H/O: hypothyroidism 08/23/2015  . Obesity (BMI 30-39.9) 08/23/2015  . HZV (herpes zoster virus) post herpetic neuralgia 08/23/2015  . History of renal stone 04/01/2014  . Diabetes mellitus, controlled ([place]) 04/01/2014  . Essential hypertension, benign 04/01/2014  . H/O prosthetic heart valve 06/15/2012  . CD (contact dermatitis) 07/25/2008    Past Surgical History:  Procedure Laterality Date  . AORTIC VALVE REPLACEMENT  06/2012   [facility]  . APPENDECTOMY  1958  . CHOLECYSTECTOMY  1978  . COLONOSCOPY  05-09-2014  . CYSTOSCOPY/RETROGRADE/URETEROSCOPY Right 07/04/2014   Procedure: CYSTOSCOPY/RETROGRADE/URETEROSCOPY;  Surgeon: Bernestine Amass, MD;  Location: [facility];  Service: Urology;  Laterality: Right;  . EXTRACORPOREAL SHOCK WAVE LITHOTRIPSY Right 06/ 2015     [place]  . HOLMIUM LASER APPLICATION Right 12/14/2692   Procedure: HOLMIUM LASER APPLICATION;  Surgeon: Bernestine Amass, MD;  Location: [facility];  Service: Urology;  Laterality: Right;  . TONSILLECTOMY  1953    Family History  Problem Relation Age of Onset  . Heart disease Mother   . Heart disease Father   . Asthma Daughter   . Asthma Maternal Grandfather   . Heart disease Sister   . Heart attack Sister 59  . Colon cancer Neg Hx     Social History   Social History  . Marital status: Divorced    Spouse name: N/A  . Number of children: 2  . Years of education: N/A   Occupational History  . Works for [employer]  . Smoking status: Never Smoker  . Smokeless tobacco: Never Used  . Alcohol use No  . Drug use: No  . Sexual activity: Not Currently   Other Topics Concern  . 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 Drinks 2-3 caffeinated  beverages/day     Current Outpatient Prescriptions:  .  albuterol (PROAIR HFA) 108 (90 Base) MCG/ACT inhaler, Inhale 2 puffs into the lungs every 4 (four) hours as needed for shortness of breath., Disp: 1 Inhaler, Rfl: 2 .  aspirin 81 MG tablet, Take 81 mg by mouth daily., Disp: , Rfl:  .  Astaxanthin 4 MG CAPS, Take by mouth 2 (two) times daily., Disp: , Rfl:  .  blood glucose meter kit and supplies KIT, Dispense based on patient and insurance preference. Use up to four times daily as directed. (FOR ICD-9 250.00, 250.01)., Disp: 1 each, Rfl: 0 .  blood glucose meter kit and supplies, , Disp: , Rfl:  .  budesonide (PULMICORT) 0.5 MG/2ML nebulizer solution, Take 0.5 mg by nebulization as needed., Disp: , Rfl:  .  Cholecalciferol (VITAMIN D) 2000 UNITS tablet, Take 2,000 Units by mouth daily.  , Disp: , Rfl:  .  Coenzyme Q10 (CO Q-10) 100 MG CAPS, Take 1 capsule by mouth daily. , Disp: , Rfl:  .  enalapril (VASOTEC) 10 MG tablet, Take 1 tablet (10 mg total) by mouth daily. (Patient taking differently: Take 5 mg by mouth daily. ), Disp: 90 tablet, Rfl: 1 .  MAG THREONATE-NIACINAMIDE ER PO, Take 800 mg by mouth 2 (two) times daily., Disp: , Rfl:  .  Menaquinone-7 (VITAMIN K2) 40 MCG TABS, Take 1 tablet by mouth daily., Disp: , Rfl:  .  metFORMIN (GLUCOPHAGE) 500 MG tablet, Take 1 tablet (500 mg total) by mouth 2 (two) times daily., Disp: 180 tablet, Rfl: 1 .  Multiple Vitamin (MULTI-VITAMIN DAILY PO), Take by mouth as directed., Disp: , Rfl:  .  Omega-3 Fatty Acids (FISH OIL) 1000 MG CAPS, Take 1 capsule by mouth daily. UAD, Disp: , Rfl:  .  Probiotic Product (PROBIOTIC DAILY PO), Take 1 capsule by mouth daily., Disp: , Rfl:  .  azithromycin (ZITHROMAX) 250 MG tablet, Take as directed, Disp: 6 tablet, Rfl: 0  Allergies  Allergen Reactions  . Cyanocobalamin Other (See Comments)    Other reaction(s): Other (See Comments) Sublingual-unconcious  . Dilaudid [Hydromorphone Hcl] Other (See Comments)     Severe hypotension - pt states that if she is given this medication it will kill her  . Naproxen Shortness Of Breath  . Percocet [Oxycodone-Acetaminophen] Shortness Of Breath    Can tolerate oxy IR  . Latex Hives  . Aspirin Other (See Comments)    Nose bleeds at higher doses  . Nsaids Other (See Comments)    Causes asthma  . Other Swelling    Eye drop preservative  . Rosuvastatin      ROS  Constitutional: Negative for fever, positive for  weight change -11 lbs weight loss  Respiratory: Positive  For intermittent  cough and shortness of breath.   Cardiovascular: Negative for chest pain ( but went to [facility] earlier this month)  or palpitations.  Gastrointestinal: Negative for abdominal pain, no bowel changes.  Musculoskeletal: Positive  for gait problem and intermittent for  joint swelling.  Skin: Negative for rash.  Neurological: Negative for dizziness or headache.  No other specific complaints in a complete review of systems (except as listed in HPI above).  Objective  Vitals:   07/25/17 0910  BP: 122/64  Pulse: 88  Resp: 16  Temp: 97.6 F (36.4 C)  TempSrc: Oral  SpO2: 99%  Weight: 191 lb 3.2 oz (86.7 kg)  Height: '5\' 1"'$  (1.549 m)    Body mass index is 36.13 kg/m.  Physical Exam  Constitutional: Patient appears well-developed and well-nourished. Obese  No distress.  HEENT: head atraumatic, normocephalic, pupils equal and reactive to light,  neck supple, throat within normal limits Cardiovascular: Normal rate, regular rhythm and normal heart sounds, opening click from aortic valve ( status replacement).  No murmur heard. No BLE edema. Pulmonary/Chest: rhonchi on both lung fields, some scattered wheezing, no crackles.  Abdominal: Soft.  There is  Tenderness on epigastric and LUQ pain. Negative CVA tenderness  Psychiatric: Patient has a normal mood and affect. behavior is normal. Judgment and thought content normal.   Recent Results (from the past 2160 hour(s))   CBC with Differential/Platelet     Status: Abnormal   Collection Time: 07/10/17  5:52 AM  Result Value Ref Range   WBC 8.1 4.0 - 10.5 K/uL   RBC 4.06 3.87 - 5.11 MIL/uL   Hemoglobin 11.3 (L) 12.0 - 15.0 g/dL  HCT 35.3 (L) 36.0 - 46.0 %   MCV 86.9 78.0 - 100.0 fL   MCH 27.8 26.0 - 34.0 pg   MCHC 32.0 30.0 - 36.0 g/dL   RDW 13.9 11.5 - 15.5 %   Platelets 200 150 - 400 K/uL   Neutrophils Relative % 65 %   Neutro Abs 5.3 1.7 - 7.7 K/uL   Lymphocytes Relative 22 %   Lymphs Abs 1.8 0.7 - 4.0 K/uL   Monocytes Relative 11 %   Monocytes Absolute 0.9 0.1 - 1.0 K/uL   Eosinophils Relative 2 %   Eosinophils Absolute 0.2 0.0 - 0.7 K/uL   Basophils Relative 0 %   Basophils Absolute 0.0 0.0 - 0.1 K/uL  I-stat troponin, ED     Status: None   Collection Time: 07/10/17  5:55 AM  Result Value Ref Range   Troponin i, poc 0.00 0.00 - 0.08 ng/mL   Comment 3            Comment: Due to the release kinetics of cTnI, a negative result within the first hours of the onset of symptoms does not rule out myocardial infarction with certainty. If myocardial infarction is still suspected, repeat the test at appropriate intervals.   I-Stat Chem 8, ED     Status: Abnormal   Collection Time: 07/10/17  5:56 AM  Result Value Ref Range   Sodium 139 135 - 145 mmol/L   Potassium 4.0 3.5 - 5.1 mmol/L   Chloride 104 101 - 111 mmol/L   BUN 17 6 - 20 mg/dL   Creatinine, Ser 0.60 0.44 - 1.00 mg/dL   Glucose, Bld 115 (H) 65 - 99 mg/dL   Calcium, Ion 1.16 1.15 - 1.40 mmol/L   TCO2 26 0 - 100 mmol/L   Hemoglobin 11.2 (L) 12.0 - 15.0 g/dL   HCT 33.0 (L) 36.0 - 46.0 %  I-stat troponin, ED     Status: None   Collection Time: 07/10/17  6:57 AM  Result Value Ref Range   Troponin i, poc 0.00 0.00 - 0.08 ng/mL   Comment 3            Comment: Due to the release kinetics of cTnI, a negative result within the first hours of the onset of symptoms does not rule out myocardial infarction with certainty. If myocardial  infarction is still suspected, repeat the test at appropriate intervals.   POCT HgB A1C     Status: Normal   Collection Time: 07/25/17  9:16 AM  Result Value Ref Range   Hemoglobin A1C 5.5   POCT UA - Microalbumin     Status: None   Collection Time: 07/25/17  9:16 AM  Result Value Ref Range   Microalbumin Ur, POC 20 mg/L   Creatinine, POC  mg/dL   Albumin/Creatinine Ratio, Urine, POC      Diabetic Foot Exam: Diabetic Foot Exam - Simple   Simple Foot Form Visual Inspection No deformities, no ulcerations, no other skin breakdown bilaterally:  Yes Sensation Testing Intact to touch and monofilament testing bilaterally:  Yes Pulse Check Posterior Tibialis and Dorsalis pulse intact bilaterally:  Yes Comments     PHQ2/9: Depression screen [facility] 2/9 07/25/2017 03/27/2017 08/21/2016 08/24/2015 07/13/2012  Decreased Interest 0 0 0 0 0  Down, Depressed, Hopeless 0 0 0 0 0  PHQ - 2 Score 0 0 0 0 0     Fall Risk: Fall Risk  07/25/2017 03/27/2017 08/21/2016 08/24/2015  Falls in the past year? No No  No No    Functional Status Survey: Is the patient deaf or have difficulty hearing?: No Does the patient have difficulty seeing, even when wearing glasses/contacts?: No Does the patient have difficulty concentrating, remembering, or making decisions?: No Does the patient have difficulty walking or climbing stairs?: No Does the patient have difficulty dressing or bathing?: No Does the patient have difficulty doing errands alone such as visiting a doctor's office or shopping?: No   Assessment & Plan  1. Controlled type 2 diabetes mellitus without complication, without long-term current use of insulin ([place])  Well controlled, hgbA1C is at goal  - POCT HgB A1C - POCT UA - Microalbumin  2. Dyslipidemia associated with type 2 diabetes mellitus ([place])  Off statin   3. Hypertension, benign  Well controlled , taking 5 mg Vasetoc  4. Carotid atherosclerosis, bilateral  Has follow up with  vascular surgeon   5. Polymyalgia rheumatica syndrome ([place])  Up to date with Rheumatologist   6. History of aortic valve replacement  Has follow up with vascular surgeon   7. OSA (obstructive sleep apnea)   8. Chest pain of uncertain etiology  Went to [facility] , troponin was negative but typical symptoms advised to follow up with cardiologist   9. Screen for colon cancer  She will re-schedule the appointment   10. Bronchitis  - azithromycin (ZITHROMAX) 250 MG tablet; Take as directed  Dispense: 6 tablet; Refill: 0 We will try generic medication, but if not better she will call back and we will switch to Levaquin

## 2017-07-30 ENCOUNTER — Ambulatory Visit: Payer: Medicare Other | Admitting: Vascular Surgery

## 2017-07-30 ENCOUNTER — Encounter (HOSPITAL_COMMUNITY): Payer: Medicare Other

## 2017-07-30 DIAGNOSIS — H903 Sensorineural hearing loss, bilateral: Secondary | ICD-10-CM | POA: Diagnosis not present

## 2017-07-30 DIAGNOSIS — J342 Deviated nasal septum: Secondary | ICD-10-CM | POA: Diagnosis not present

## 2017-07-30 DIAGNOSIS — H6123 Impacted cerumen, bilateral: Secondary | ICD-10-CM | POA: Diagnosis not present

## 2017-07-30 DIAGNOSIS — J310 Chronic rhinitis: Secondary | ICD-10-CM | POA: Diagnosis not present

## 2017-08-18 ENCOUNTER — Ambulatory Visit (INDEPENDENT_AMBULATORY_CARE_PROVIDER_SITE_OTHER): Payer: Medicare Other | Admitting: Family Medicine

## 2017-08-18 ENCOUNTER — Encounter: Payer: Self-pay | Admitting: Family Medicine

## 2017-08-18 VITALS — BP 122/84 | HR 93 | Temp 97.9°F | Resp 16 | Ht 62.0 in | Wt 190.3 lb

## 2017-08-18 DIAGNOSIS — B029 Zoster without complications: Secondary | ICD-10-CM | POA: Diagnosis not present

## 2017-08-18 MED ORDER — VALACYCLOVIR HCL 1 G PO TABS: 1000.0000 mg | ORAL_TABLET | Freq: Three times a day (TID) | ORAL | 0 refills | Status: AC

## 2017-08-18 NOTE — Patient Instructions (Signed)
Please call your insurance company to see if Shingrix is covered to be administered at your Primary Care Office or the Pharmacy.

## 2017-08-18 NOTE — Progress Notes (Addendum)
Name: Bailey [name]   MRN: [id number]    DOB: 04/05/1946   Date:08/18/2017       Progress Note  Subjective  Chief Complaint  Chief Complaint  Patient presents with  . Rash    breast, shoulders abdomen, stinging, itching, burning, painful    HPI  Pt presents with rash to LEFT breast, shoulder and neck for 3-4 days.  She describes the pain at burning and stinging along with itching.  Rash started out along LEFT breast with prodrome of burning pain, then a rash erupted with blisters present.  She does have a history of shingles on the left side.   Patient Active Problem List   Diagnosis Date Noted  . OSA (obstructive sleep apnea) 01/20/2017  . Mild cognitive impairment 01/20/2017  . Carotid atherosclerosis, bilateral 01/20/2017  . Abnormal brain MRI 08/26/2016  . Inflammatory polyarthritis ([place]) 11/12/2015  . Erosive osteoarthritis of hand 11/12/2015  . History of vitamin D deficiency 11/06/2015  . Hearing loss 08/24/2015  . Asthma, mild intermittent 08/23/2015  . Arthritis, degenerative 08/23/2015  . Dyslipidemia 08/23/2015  . H/O: hypothyroidism 08/23/2015  . Obesity (BMI 30-39.9) 08/23/2015  . HZV (herpes zoster virus) post herpetic neuralgia 08/23/2015  . History of renal stone 04/01/2014  . Diabetes mellitus, controlled ([place]) 04/01/2014  . Essential hypertension, benign 04/01/2014  . H/O prosthetic heart valve 06/15/2012  . CD (contact dermatitis) 07/25/2008    Social History  Substance Use Topics  . Smoking status: Never Smoker  . Smokeless tobacco: Never Used  . Alcohol use No     Current Outpatient Prescriptions:  .  albuterol (PROAIR HFA) 108 (90 Base) MCG/ACT inhaler, Inhale 2 puffs into the lungs every 4 (four) hours as needed for shortness of breath., Disp: 1 Inhaler, Rfl: 2 .  aspirin 81 MG tablet, Take 81 mg by mouth daily., Disp: , Rfl:  .  Astaxanthin 4 MG CAPS, Take by mouth 2 (two) times daily., Disp: , Rfl:  .  azithromycin (ZITHROMAX) 250 MG  tablet, Take as directed, Disp: 6 tablet, Rfl: 0 .  blood glucose meter kit and supplies KIT, Dispense based on patient and insurance preference. Use up to four times daily as directed. (FOR ICD-9 250.00, 250.01)., Disp: 1 each, Rfl: 0 .  blood glucose meter kit and supplies, , Disp: , Rfl:  .  budesonide (PULMICORT) 0.5 MG/2ML nebulizer solution, Take 0.5 mg by nebulization as needed., Disp: , Rfl:  .  Cholecalciferol (VITAMIN D) 2000 UNITS tablet, Take 2,000 Units by mouth daily.  , Disp: , Rfl:  .  Coenzyme Q10 (CO Q-10) 100 MG CAPS, Take 1 capsule by mouth daily. , Disp: , Rfl:  .  enalapril (VASOTEC) 10 MG tablet, Take 1 tablet (10 mg total) by mouth daily. (Patient taking differently: Take 5 mg by mouth daily. ), Disp: 90 tablet, Rfl: 1 .  MAG THREONATE-NIACINAMIDE ER PO, Take 800 mg by mouth 2 (two) times daily., Disp: , Rfl:  .  Menaquinone-7 (VITAMIN K2) 40 MCG TABS, Take 1 tablet by mouth daily., Disp: , Rfl:  .  metFORMIN (GLUCOPHAGE) 500 MG tablet, Take 1 tablet (500 mg total) by mouth 2 (two) times daily., Disp: 180 tablet, Rfl: 1 .  Multiple Vitamin (MULTI-VITAMIN DAILY PO), Take by mouth as directed., Disp: , Rfl:  .  Omega-3 Fatty Acids (FISH OIL) 1000 MG CAPS, Take 1 capsule by mouth daily. UAD, Disp: , Rfl:  .  Probiotic Product (PROBIOTIC DAILY PO), Take 1 capsule by  mouth daily., Disp: , Rfl:  .  valACYclovir (VALTREX) 1000 MG tablet, Take 1 tablet (1,000 mg total) by mouth 3 (three) times daily., Disp: 21 tablet, Rfl: 0  Allergies  Allergen Reactions  . Cyanocobalamin Other (See Comments)    Other reaction(s): Other (See Comments) Sublingual-unconcious  . Dilaudid [Hydromorphone Hcl] Other (See Comments)    Severe hypotension - pt states that if she is given this medication it will kill her  . Naproxen Shortness Of Breath  . Percocet [Oxycodone-Acetaminophen] Shortness Of Breath    Can tolerate oxy IR  . Latex Hives  . Aspirin Other (See Comments)    Nose bleeds at  higher doses  . Nsaids Other (See Comments)    Causes asthma  . Other Swelling    Eye drop preservative  . Rosuvastatin     ROS  Constitutional: Negative for fever or weight change.  Respiratory: Negative for cough and shortness of breath.   Cardiovascular: Negative for chest pain or palpitations.  Gastrointestinal: Negative for abdominal pain, no bowel changes.  Musculoskeletal: Negative for gait problem or joint swelling.  Skin: Positive for rash.  Neurological: Negative for dizziness or headache.  No other specific complaints in a complete review of systems (except as listed in HPI above).  Objective  Vitals:   08/18/17 1353  BP: 122/84  Pulse: 93  Resp: 16  Temp: 97.9 F (36.6 C)  TempSrc: Oral  SpO2: 96%  Weight: 190 lb 4.8 oz (86.3 kg)  Height: 5' 2"  (1.575 m)   Body mass index is 34.81 kg/m.  Nursing Note and Vital Signs reviewed.  Physical Exam  Constitutional: Patient appears well-developed and well-nourished. Obese No distress.  HEENT: head atraumatic, normocephalic Cardiovascular: Normal rate, regular rhythm, S1/S2 present.  No murmur or rub heard. No BLE edema. Pulmonary/Chest: Effort normal and breath sounds clear. No respiratory distress or retractions. Psychiatric: Patient has a normal mood and affect. behavior is normal. Judgment and thought content normal. Skin: LEFT breast, upper chest, small area on left shoulder, and left side of neck show erythematous maculopapular rash consistent with herpes zoster exacerbation.  No ocular or facial involvement.  Recent Results (from the past 2160 hour(s))  CBC with Differential/Platelet     Status: Abnormal   Collection Time: 07/10/17  5:52 AM  Result Value Ref Range   WBC 8.1 4.0 - 10.5 K/uL   RBC 4.06 3.87 - 5.11 MIL/uL   Hemoglobin 11.3 (L) 12.0 - 15.0 g/dL   HCT 35.3 (L) 36.0 - 46.0 %   MCV 86.9 78.0 - 100.0 fL   MCH 27.8 26.0 - 34.0 pg   MCHC 32.0 30.0 - 36.0 g/dL   RDW 13.9 11.5 - 15.5 %    Platelets 200 150 - 400 K/uL   Neutrophils Relative % 65 %   Neutro Abs 5.3 1.7 - 7.7 K/uL   Lymphocytes Relative 22 %   Lymphs Abs 1.8 0.7 - 4.0 K/uL   Monocytes Relative 11 %   Monocytes Absolute 0.9 0.1 - 1.0 K/uL   Eosinophils Relative 2 %   Eosinophils Absolute 0.2 0.0 - 0.7 K/uL   Basophils Relative 0 %   Basophils Absolute 0.0 0.0 - 0.1 K/uL  I-stat troponin, ED     Status: None   Collection Time: 07/10/17  5:55 AM  Result Value Ref Range   Troponin i, poc 0.00 0.00 - 0.08 ng/mL   Comment 3            Comment: Due  to the release kinetics of cTnI, a negative result within the first hours of the onset of symptoms does not rule out myocardial infarction with certainty. If myocardial infarction is still suspected, repeat the test at appropriate intervals.   I-Stat Chem 8, ED     Status: Abnormal   Collection Time: 07/10/17  5:56 AM  Result Value Ref Range   Sodium 139 135 - 145 mmol/L   Potassium 4.0 3.5 - 5.1 mmol/L   Chloride 104 101 - 111 mmol/L   BUN 17 6 - 20 mg/dL   Creatinine, Ser 0.60 0.44 - 1.00 mg/dL   Glucose, Bld 115 (H) 65 - 99 mg/dL   Calcium, Ion 1.16 1.15 - 1.40 mmol/L   TCO2 26 0 - 100 mmol/L   Hemoglobin 11.2 (L) 12.0 - 15.0 g/dL   HCT 33.0 (L) 36.0 - 46.0 %  I-stat troponin, ED     Status: None   Collection Time: 07/10/17  6:57 AM  Result Value Ref Range   Troponin i, poc 0.00 0.00 - 0.08 ng/mL   Comment 3            Comment: Due to the release kinetics of cTnI, a negative result within the first hours of the onset of symptoms does not rule out myocardial infarction with certainty. If myocardial infarction is still suspected, repeat the test at appropriate intervals.   POCT HgB A1C     Status: Normal   Collection Time: 07/25/17  9:16 AM  Result Value Ref Range   Hemoglobin A1C 5.5   POCT UA - Microalbumin     Status: None   Collection Time: 07/25/17  9:16 AM  Result Value Ref Range   Microalbumin Ur, POC 20 mg/L   Creatinine, POC  mg/dL    Albumin/Creatinine Ratio, Urine, POC       Assessment & Plan  1. Herpes zoster without complication - valACYclovir (VALTREX) 1000 MG tablet; Take 1 tablet (1,000 mg total) by mouth 3 (three) times daily.  Dispense: 21 tablet; Refill: 0 - Advised rash is likely herpes zoster, and because it has continued to spread, we will treat with Valtrex for 7 days. Return if symptoms worsen or fail to improve, for 3 days.  -Red flags and when to present for emergency care or RTC including fever >101.67F, chest pain, shortness of breath, rash continuing to spread - especially to the face, new/worsening/un-resolving symptoms, reviewed with patient at time of visit. Follow up and care instructions discussed and provided in AVS.  I have reviewed this encounter including the documentation in this note and/or discussed this patient with the Johney Maine, FNP, NP-C. I am certifying that I agree with the content of this note as supervising physician.  Steele Sizer, MD [facility] 08/22/2017, 5:15 PM

## 2017-08-22 DIAGNOSIS — R21 Rash and other nonspecific skin eruption: Secondary | ICD-10-CM | POA: Diagnosis not present

## 2017-08-28 ENCOUNTER — Other Ambulatory Visit: Payer: Self-pay

## 2017-08-28 DIAGNOSIS — B0229 Other postherpetic nervous system involvement: Secondary | ICD-10-CM

## 2017-08-28 DIAGNOSIS — L259 Unspecified contact dermatitis, unspecified cause: Secondary | ICD-10-CM

## 2017-09-22 ENCOUNTER — Telehealth: Payer: Self-pay | Admitting: Family Medicine

## 2017-10-17 ENCOUNTER — Encounter: Payer: Self-pay | Admitting: Family Medicine

## 2017-10-17 DIAGNOSIS — E119 Type 2 diabetes mellitus without complications: Secondary | ICD-10-CM | POA: Diagnosis not present

## 2017-10-17 DIAGNOSIS — H524 Presbyopia: Secondary | ICD-10-CM | POA: Diagnosis not present

## 2017-10-17 LAB — HM DIABETES EYE EXAM

## 2017-11-03 ENCOUNTER — Other Ambulatory Visit: Payer: Self-pay | Admitting: Family Medicine

## 2017-11-03 DIAGNOSIS — I10 Essential (primary) hypertension: Secondary | ICD-10-CM

## 2017-11-04 NOTE — Telephone Encounter (Signed)
Hypertension medication request: Enalapril to [facility].  Last office visit pertaining to hypertension: 07/25/2017  BP Readings from Last 3 Encounters:  08/18/17 122/84  07/25/17 122/64  07/10/17 109/80    Lab Results  Component Value Date   CREATININE 0.60 07/10/2017   BUN 17 07/10/2017   NA 139 07/10/2017   K 4.0 07/10/2017   CL 104 07/10/2017   CO2 27 03/27/2017    Follow up on: 01/28/2018

## 2017-12-31 DIAGNOSIS — H838X3 Other specified diseases of inner ear, bilateral: Secondary | ICD-10-CM | POA: Diagnosis not present

## 2017-12-31 DIAGNOSIS — H903 Sensorineural hearing loss, bilateral: Secondary | ICD-10-CM | POA: Diagnosis not present

## 2017-12-31 DIAGNOSIS — H6123 Impacted cerumen, bilateral: Secondary | ICD-10-CM | POA: Diagnosis not present

## 2017-12-31 DIAGNOSIS — H6983 Other specified disorders of Eustachian tube, bilateral: Secondary | ICD-10-CM | POA: Diagnosis not present

## 2018-01-15 DIAGNOSIS — R51 Headache: Secondary | ICD-10-CM | POA: Diagnosis not present

## 2018-01-15 DIAGNOSIS — M154 Erosive (osteo)arthritis: Secondary | ICD-10-CM | POA: Diagnosis not present

## 2018-01-15 DIAGNOSIS — R5383 Other fatigue: Secondary | ICD-10-CM | POA: Diagnosis not present

## 2018-01-15 DIAGNOSIS — M064 Inflammatory polyarthropathy: Secondary | ICD-10-CM | POA: Diagnosis not present

## 2018-01-15 DIAGNOSIS — R5381 Other malaise: Secondary | ICD-10-CM | POA: Diagnosis not present

## 2018-01-15 DIAGNOSIS — M2550 Pain in unspecified joint: Secondary | ICD-10-CM | POA: Diagnosis not present

## 2018-01-15 DIAGNOSIS — Z6836 Body mass index (BMI) 36.0-36.9, adult: Secondary | ICD-10-CM | POA: Diagnosis not present

## 2018-01-15 DIAGNOSIS — E669 Obesity, unspecified: Secondary | ICD-10-CM | POA: Diagnosis not present

## 2018-01-20 NOTE — Telephone Encounter (Signed)
Appt resolved

## 2018-01-28 ENCOUNTER — Other Ambulatory Visit: Payer: Self-pay | Admitting: Family Medicine

## 2018-01-28 ENCOUNTER — Ambulatory Visit: Payer: Self-pay | Admitting: Family Medicine

## 2018-01-28 DIAGNOSIS — I10 Essential (primary) hypertension: Secondary | ICD-10-CM

## 2018-01-28 NOTE — Telephone Encounter (Signed)
Hypertension medication request: Enalapril to [facility].   Last office visit pertaining to hypertension: 07/25/2017  BP Readings from Last 3 Encounters:  08/18/17 122/84  07/25/17 122/64  07/10/17 109/80    Lab Results  Component Value Date   CREATININE 0.60 07/10/2017   BUN 17 07/10/2017   NA 139 07/10/2017   K 4.0 07/10/2017   CL 104 07/10/2017   CO2 27 03/27/2017     Follow up 03/09/2018

## 2018-02-03 DIAGNOSIS — G43101 Migraine with aura, not intractable, with status migrainosus: Secondary | ICD-10-CM | POA: Diagnosis not present

## 2018-02-23 ENCOUNTER — Other Ambulatory Visit: Payer: Self-pay | Admitting: Family Medicine

## 2018-02-23 DIAGNOSIS — E119 Type 2 diabetes mellitus without complications: Secondary | ICD-10-CM

## 2018-02-23 NOTE — Telephone Encounter (Signed)
Refill request for diabetic medication:   Metformin 500 mg  Last office visit pertaining to diabetes: 08/18/2017  Lab Results  Component Value Date   HGBA1C 5.5 07/25/2017    Follow-up on file. 03/09/2018

## 2018-03-03 DIAGNOSIS — L989 Disorder of the skin and subcutaneous tissue, unspecified: Secondary | ICD-10-CM | POA: Diagnosis not present

## 2018-03-06 DIAGNOSIS — H524 Presbyopia: Secondary | ICD-10-CM | POA: Diagnosis not present

## 2018-03-06 DIAGNOSIS — E119 Type 2 diabetes mellitus without complications: Secondary | ICD-10-CM | POA: Diagnosis not present

## 2018-03-06 DIAGNOSIS — H5203 Hypermetropia, bilateral: Secondary | ICD-10-CM | POA: Diagnosis not present

## 2018-03-06 DIAGNOSIS — H2513 Age-related nuclear cataract, bilateral: Secondary | ICD-10-CM | POA: Diagnosis not present

## 2018-03-06 DIAGNOSIS — H52223 Regular astigmatism, bilateral: Secondary | ICD-10-CM | POA: Diagnosis not present

## 2018-03-06 LAB — HM DIABETES EYE EXAM

## 2018-03-08 DIAGNOSIS — L821 Other seborrheic keratosis: Secondary | ICD-10-CM | POA: Diagnosis not present

## 2018-03-09 ENCOUNTER — Ambulatory Visit (INDEPENDENT_AMBULATORY_CARE_PROVIDER_SITE_OTHER): Payer: Medicare Other | Admitting: Family Medicine

## 2018-03-09 ENCOUNTER — Encounter: Payer: Self-pay | Admitting: Family Medicine

## 2018-03-09 VITALS — BP 130/80 | HR 71 | Resp 16 | Ht 62.0 in | Wt 200.8 lb

## 2018-03-09 DIAGNOSIS — Z952 Presence of prosthetic heart valve: Secondary | ICD-10-CM | POA: Diagnosis not present

## 2018-03-09 DIAGNOSIS — E1169 Type 2 diabetes mellitus with other specified complication: Secondary | ICD-10-CM | POA: Diagnosis not present

## 2018-03-09 DIAGNOSIS — R0602 Shortness of breath: Secondary | ICD-10-CM | POA: Diagnosis not present

## 2018-03-09 DIAGNOSIS — E785 Hyperlipidemia, unspecified: Secondary | ICD-10-CM | POA: Diagnosis not present

## 2018-03-09 DIAGNOSIS — Z79899 Other long term (current) drug therapy: Secondary | ICD-10-CM

## 2018-03-09 DIAGNOSIS — Z8639 Personal history of other endocrine, nutritional and metabolic disease: Secondary | ICD-10-CM

## 2018-03-09 DIAGNOSIS — E559 Vitamin D deficiency, unspecified: Secondary | ICD-10-CM | POA: Diagnosis not present

## 2018-03-09 DIAGNOSIS — G4733 Obstructive sleep apnea (adult) (pediatric): Secondary | ICD-10-CM

## 2018-03-09 DIAGNOSIS — Z1231 Encounter for screening mammogram for malignant neoplasm of breast: Secondary | ICD-10-CM

## 2018-03-09 DIAGNOSIS — D649 Anemia, unspecified: Secondary | ICD-10-CM

## 2018-03-09 DIAGNOSIS — M353 Polymyalgia rheumatica: Secondary | ICD-10-CM

## 2018-03-09 DIAGNOSIS — Z1211 Encounter for screening for malignant neoplasm of colon: Secondary | ICD-10-CM | POA: Diagnosis not present

## 2018-03-09 DIAGNOSIS — Z23 Encounter for immunization: Secondary | ICD-10-CM | POA: Diagnosis not present

## 2018-03-09 DIAGNOSIS — E669 Obesity, unspecified: Secondary | ICD-10-CM

## 2018-03-09 DIAGNOSIS — I10 Essential (primary) hypertension: Secondary | ICD-10-CM | POA: Diagnosis not present

## 2018-03-09 DIAGNOSIS — I6523 Occlusion and stenosis of bilateral carotid arteries: Secondary | ICD-10-CM | POA: Diagnosis not present

## 2018-03-09 DIAGNOSIS — J4520 Mild intermittent asthma, uncomplicated: Secondary | ICD-10-CM | POA: Diagnosis not present

## 2018-03-09 LAB — POCT GLYCOSYLATED HEMOGLOBIN (HGB A1C): HEMOGLOBIN A1C: 5.7

## 2018-03-09 MED ORDER — ZOSTER VAC RECOMB ADJUVANTED 50 MCG/0.5ML IM SUSR: 0.5000 mL | Freq: Once | INTRAMUSCULAR | 1 refills | Status: AC

## 2018-03-09 MED ORDER — METFORMIN HCL 500 MG PO TABS: 500.0000 mg | ORAL_TABLET | Freq: Two times a day (BID) | ORAL | 0 refills | Status: DC

## 2018-03-09 MED ORDER — ENALAPRIL MALEATE 10 MG PO TABS: ORAL_TABLET | ORAL | 1 refills | Status: DC

## 2018-03-09 MED ORDER — BUDESONIDE 0.5 MG/2ML IN SUSP: 0.5000 mg | RESPIRATORY_TRACT | 1 refills | Status: DC | PRN

## 2018-03-09 MED ORDER — ALBUTEROL SULFATE HFA 108 (90 BASE) MCG/ACT IN AERS: 2.0000 | INHALATION_SPRAY | RESPIRATORY_TRACT | 2 refills | Status: DC | PRN

## 2018-03-09 NOTE — Progress Notes (Signed)
Name: Bailey [name]   MRN: [id number]    DOB: Sep 23, 1946   Date:03/09/2018       Progress Note  Subjective  Chief Complaint  Chief Complaint  Patient presents with  . Diabetes  . Hypertension  . Asthma    HPI  DMII: she has gained 10.8 lbs since September 2018 lbs. She has not been checking her glucose at home but denies polyphagia, polydipsia or polyuria.. Last hgbA1C was 6.0% down to 5.5%, today it is up 5.7%. She is doing HIIT 4 times daily. She states she had lost down to 194 lbs after the holidays, but gained 6 lbs in the past four days. She denies swelling, denies change in breathing at night, wearing CPAP. On Ace for kidney protection, dyslipidemia - she only believes in fish oil, refuses statin therapy.   Asthma Mild Intermittent: she is uses Pulmicort prn, not recently but would like a refill albuterol occasionally, she needs a refill.   HTN: taking medication and denies side effects of medication, bp is elevated today, usually at goal,  no dizziness, chest pain or palpitation. She has noticed intermittent headache today either temple.   Obesity: she has gained 10 lbs since 08/2017. She has changed the way she eats and has been doing exercises 4 times daily at home, she is not sure why she is gaining weight  Dyslipidemia: unable to tolerate statin therapy , she is taking fish oil only   Hearing loss: seen by ENT, needs hearing aid but can't afford it at this time  Polymyalgia Rheumatica: seen by Rheumatologist , she is off prednisone now, she is worried about the weight gain and temporal pain intermittently, since that is how symptoms started again.   Fatigue: she states last week she felt nauseated, tired, missed two days of work., she is worried about PMR   Patient Active Problem List   Diagnosis Date Noted  . OSA (obstructive sleep apnea) 01/20/2017  . Mild cognitive impairment 01/20/2017  . Carotid atherosclerosis, bilateral 01/20/2017  . Abnormal brain MRI  08/26/2016  . Inflammatory polyarthritis ([place]) 11/12/2015  . Erosive osteoarthritis of hand 11/12/2015  . History of vitamin D deficiency 11/06/2015  . Hearing loss 08/24/2015  . Asthma, mild intermittent 08/23/2015  . Arthritis, degenerative 08/23/2015  . Dyslipidemia 08/23/2015  . H/O: hypothyroidism 08/23/2015  . Obesity (BMI 30-39.9) 08/23/2015  . HZV (herpes zoster virus) post herpetic neuralgia 08/23/2015  . History of renal stone 04/01/2014  . Diabetes mellitus, controlled ([place]) 04/01/2014  . Essential hypertension, benign 04/01/2014  . H/O prosthetic heart valve 06/15/2012  . CD (contact dermatitis) 07/25/2008    Past Surgical History:  Procedure Laterality Date  . AORTIC VALVE REPLACEMENT  06/2012   [facility]  . APPENDECTOMY  1958  . CHOLECYSTECTOMY  1978  . COLONOSCOPY  05-09-2014  . CYSTOSCOPY/RETROGRADE/URETEROSCOPY Right 07/04/2014   Procedure: CYSTOSCOPY/RETROGRADE/URETEROSCOPY;  Surgeon: Bernestine Amass, MD;  Location: [facility];  Service: Urology;  Laterality: Right;  . EXTRACORPOREAL SHOCK WAVE LITHOTRIPSY Right 06/ 2015     [place]  . HOLMIUM LASER APPLICATION Right 0/16/0109   Procedure: HOLMIUM LASER APPLICATION;  Surgeon: Bernestine Amass, MD;  Location: [facility];  Service: Urology;  Laterality: Right;  . TONSILLECTOMY  1953    Family History  Problem Relation Age of Onset  . Heart disease Mother   . Heart disease Father   . Asthma Daughter   . Asthma Maternal Grandfather   . Heart disease Sister   .  Heart attack Sister 38  . Colon cancer Neg Hx     Social History   Socioeconomic History  . Marital status: Divorced    Spouse name: Not on file  . Number of children: 2  . Years of education: Not on file  . Highest education level: Not on file  Occupational History  . Occupation: Works for [employer]  . Financial resource strain: Not on file  . Food insecurity:    Worry: Not on file     Inability: Not on file  . Transportation needs:    Medical: Not on file    Non-medical: Not on file  Tobacco Use  . Smoking status: Never Smoker  . Smokeless tobacco: Never Used  Substance and Sexual Activity  . Alcohol use: No    Alcohol/week: 0.0 oz  . Drug use: No  . Sexual activity: Not Currently  Lifestyle  . Physical activity:    Days per week: Not on file    Minutes per session: Not on file  . Stress: Not on file  Relationships  . Social connections:    Talks on phone: Not on file    Gets together: Not on file    Attends religious service: Not on file    Active member of club or organization: Not on file    Attends meetings of clubs or organizations: Not on file    Relationship status: Not on file  . Intimate partner violence:    Fear of current or ex partner: Not on file    Emotionally abused: Not on file    Physically abused: Not on file    Forced sexual activity: Not on file  Other Topics Concern  . Not on file  Social History Narrative   Divorced, 2 children. Works as a Counsellor. Drinks 2-3 caffeinated beverages/day     Current Outpatient Medications:  .  albuterol (PROAIR HFA) 108 (90 Base) MCG/ACT inhaler, Inhale 2 puffs into the lungs every 4 (four) hours as needed for shortness of breath., Disp: 1 Inhaler, Rfl: 2 .  Astaxanthin 4 MG CAPS, Take by mouth 2 (two) times daily., Disp: , Rfl:  .  budesonide (PULMICORT) 0.5 MG/2ML nebulizer solution, Take 0.5 mg by nebulization as needed., Disp: , Rfl:  .  Cholecalciferol (VITAMIN D) 2000 UNITS tablet, Take 2,000 Units by mouth daily.  , Disp: , Rfl:  .  Coenzyme Q10 (CO Q-10) 100 MG CAPS, Take 1 capsule by mouth daily. , Disp: , Rfl:  .  enalapril (VASOTEC) 10 MG tablet, TAKE 1 TABLET(10 MG) BY MOUTH DAILY, Disp: 90 tablet, Rfl: 0 .  MAG THREONATE-NIACINAMIDE ER PO, Take 800 mg by mouth 2 (two) times daily., Disp: , Rfl:  .  Menaquinone-7 (VITAMIN K2) 40 MCG TABS, Take 1 tablet by mouth daily., Disp: , Rfl:  .   metFORMIN (GLUCOPHAGE) 500 MG tablet, Take 1 tablet (500 mg total) by mouth 2 (two) times daily with a meal., Disp: 180 tablet, Rfl: 0 .  Multiple Vitamin (MULTI-VITAMIN DAILY PO), Take by mouth as directed., Disp: , Rfl:  .  Omega-3 Fatty Acids (FISH OIL) 1000 MG CAPS, Take 1 capsule by mouth daily. UAD, Disp: , Rfl:  .  Probiotic Product (PROBIOTIC DAILY PO), Take 1 capsule by mouth daily., Disp: , Rfl:   Allergies  Allergen Reactions  . Cyanocobalamin Other (See Comments)    Other reaction(s): Other (See Comments) Sublingual-unconcious  . Dilaudid [Hydromorphone Hcl] Other (See Comments)  Severe hypotension - pt states that if she is given this medication it will kill her  . Naproxen Shortness Of Breath  . Nsaids Other (See Comments) and Shortness Of Breath    Causes asthma  . Percocet [Oxycodone-Acetaminophen] Shortness Of Breath    Can tolerate oxy IR  . Latex Hives  . Aspirin Other (See Comments)    Nose bleeds at higher doses  . Other Swelling    Eye drop preservative  . Rosuvastatin      ROS  Constitutional: Negative for fever, positive for weight change.  Respiratory: Positive  for intermittent cough and shortness of breath, denies orthopnea. .   Cardiovascular: Negative for chest pain or palpitations.  Gastrointestinal: Negative for abdominal pain, no bowel changes.  Musculoskeletal: Negative for gait problem or joint swelling.  Skin: Negative for rash.  Neurological: Negative for dizziness, positive for intermittent  headache.  No other specific complaints in a complete review of systems (except as listed in HPI above).  Objective  Vitals:   03/09/18 0740 03/09/18 0756  BP: 140/70 (!) 150/90  Pulse: 71   Resp: 16   SpO2: 99%   Weight: 200 lb 12.8 oz (91.1 kg)   Height: 5\' 2"  (1.575 m)     Body mass index is 36.73 kg/m.  Physical Exam  Constitutional: Patient appears well-developed and well-nourished. Obese No distress.  HEENT: head atraumatic,  normocephalic, pupils equal and reactive to light, neck supple, throat within normal limits Cardiovascular: Normal rate, regular rhythm and normal heart sounds.  2/6 murmur heard, opening click heard.. No BLE edema. Pulmonary/Chest: Effort normal and breath sounds normal. No respiratory distress. Abdominal: Soft.  There is no tenderness. Psychiatric: Patient has a normal mood and affect. behavior is normal. Judgment and thought content normal.  Recent Results (from the past 2160 hour(s))  POCT HgB A1C     Status: Abnormal   Collection Time: 03/09/18  7:56 AM  Result Value Ref Range   Hemoglobin A1C 5.7      PHQ2/9: Depression screen [facility] 2/9 07/25/2017 03/27/2017 08/21/2016 08/24/2015 07/13/2012  Decreased Interest 0 0 0 0 0  Down, Depressed, Hopeless 0 0 0 0 0  PHQ - 2 Score 0 0 0 0 0     Fall Risk: Fall Risk  03/09/2018 07/25/2017 03/27/2017 08/21/2016 08/24/2015  Falls in the past year? No No No No No     Functional Status Survey: Is the patient deaf or have difficulty hearing?: No Does the patient have difficulty seeing, even when wearing glasses/contacts?: No Does the patient have difficulty concentrating, remembering, or making decisions?: No Does the patient have difficulty walking or climbing stairs?: No Does the patient have difficulty dressing or bathing?: No Does the patient have difficulty doing errands alone such as visiting a doctor's office or shopping?: No   Assessment & Plan  1. Diabetes mellitus type 2 in obese (HCC)  - POCT HgB A1C - metFORMIN (GLUCOPHAGE) 500 MG tablet; Take 1 tablet (500 mg total) by mouth 2 (two) times daily with a meal.  Dispense: 180 tablet; Refill: 0  2. Mild intermittent asthma without complication  - budesonide (PULMICORT) 0.5 MG/2ML nebulizer solution; Take 2 mLs (0.5 mg total) by nebulization as needed.  Dispense: 120 mL; Refill: 1 - albuterol (PROAIR HFA) 108 (90 Base) MCG/ACT inhaler; Inhale 2 puffs into the lungs every 4 (four) hours  as needed for shortness of breath.  Dispense: 1 Inhaler; Refill: 2  3. Hypertension, benign  - enalapril (VASOTEC) 10  MG tablet; TAKE 1 TABLET(10 MG) BY MOUTH DAILY  Dispense: 90 tablet; Refill: 1 - COMPLETE METABOLIC PANEL WITH GFR   4. Need for shingles vaccine  - Zoster Vaccine Adjuvanted [facility]) injection; Inject 0.5 mLs into the muscle once for 1 dose.  Dispense: 0.5 mL; Refill: 1  5. Dyslipidemia associated with type 2 diabetes mellitus (HCC)  - Lipid panel  6. OSA (obstructive sleep apnea)  Compliant with CPAP   7. History of aortic valve replacement   8. Carotid atherosclerosis, bilateral  She is doing well at this time  9. Encounter for screening mammogram for breast cancer  - MM DIGITAL SCREENING BILATERAL; Future  10. Colon cancer screening  - Ambulatory referral to Gastroenterology  11. History of hypothyroidism  - TSH  12. Anemia, unspecified type  - CBC with Differential/Platelet - Vitamin B12 - Iron, TIBC and Ferritin Panel; Future  13. Vitamin D deficiency  - VITAMIN D 25 Hydroxy (Vit-D Deficiency, Fractures)  14. Long-term use of high-risk medication  - Vitamin B12  15. SOB (shortness of breath)  - Brain natriuretic peptide  16. Polymyalgia rheumatica (HCC)  - C-reactive protein - Sedimentation rate

## 2018-03-10 ENCOUNTER — Telehealth: Payer: Self-pay | Admitting: Family Medicine

## 2018-03-10 LAB — CBC WITH DIFFERENTIAL/PLATELET
BASOS PCT: 0.3 %
Basophils Absolute: 17 cells/uL (ref 0–200)
EOS PCT: 1.5 %
Eosinophils Absolute: 87 cells/uL (ref 15–500)
HCT: 36.5 % (ref 35.0–45.0)
Hemoglobin: 12.2 g/dL (ref 11.7–15.5)
Lymphs Abs: 1276 cells/uL (ref 850–3900)
MCH: 28.4 pg (ref 27.0–33.0)
MCHC: 33.4 g/dL (ref 32.0–36.0)
MCV: 84.9 fL (ref 80.0–100.0)
MONOS PCT: 11.5 %
MPV: 10.8 fL (ref 7.5–12.5)
Neutro Abs: 3753 cells/uL (ref 1500–7800)
Neutrophils Relative %: 64.7 %
PLATELETS: 193 10*3/uL (ref 140–400)
RBC: 4.3 10*6/uL (ref 3.80–5.10)
RDW: 13.3 % (ref 11.0–15.0)
TOTAL LYMPHOCYTE: 22 %
WBC mixed population: 667 cells/uL (ref 200–950)
WBC: 5.8 10*3/uL (ref 3.8–10.8)

## 2018-03-10 LAB — VITAMIN D 25 HYDROXY (VIT D DEFICIENCY, FRACTURES): Vit D, 25-Hydroxy: 57 ng/mL (ref 30–100)

## 2018-03-10 LAB — COMPLETE METABOLIC PANEL WITH GFR
AG RATIO: 1.6 (calc) (ref 1.0–2.5)
ALKALINE PHOSPHATASE (APISO): 39 U/L (ref 33–130)
ALT: 13 U/L (ref 6–29)
AST: 15 U/L (ref 10–35)
Albumin: 4.2 g/dL (ref 3.6–5.1)
BUN: 15 mg/dL (ref 7–25)
CHLORIDE: 102 mmol/L (ref 98–110)
CO2: 29 mmol/L (ref 20–32)
Calcium: 9.8 mg/dL (ref 8.6–10.4)
Creat: 0.7 mg/dL (ref 0.60–0.93)
GFR, Est African American: 102 mL/min/{1.73_m2} (ref >=60)
GFR, Est Non African American: 88 mL/min/{1.73_m2} (ref >=60)
GLOBULIN: 2.6 g/dL (ref 1.9–3.7)
Glucose, Bld: 92 mg/dL (ref 65–99)
POTASSIUM: 4.8 mmol/L (ref 3.5–5.3)
SODIUM: 139 mmol/L (ref 135–146)
Total Bilirubin: 0.3 mg/dL (ref 0.2–1.2)
Total Protein: 6.8 g/dL (ref 6.1–8.1)

## 2018-03-10 LAB — TSH: TSH: 1.84 m[IU]/L (ref 0.40–4.50)

## 2018-03-10 LAB — BRAIN NATRIURETIC PEPTIDE: BRAIN NATRIURETIC PEPTIDE: 136 pg/mL — AB (ref <100)

## 2018-03-10 LAB — VITAMIN B12: Vitamin B-12: 806 pg/mL (ref 200–1100)

## 2018-03-10 LAB — LIPID PANEL
CHOLESTEROL: 217 mg/dL — AB (ref <200)
HDL: 58 mg/dL (ref >50)
LDL CHOLESTEROL (CALC): 134 mg/dL — AB
Non-HDL Cholesterol (Calc): 159 mg/dL (calc) — ABNORMAL HIGH (ref <130)
TRIGLYCERIDES: 130 mg/dL (ref <150)
Total CHOL/HDL Ratio: 3.7 (calc) (ref <5.0)

## 2018-03-10 LAB — C-REACTIVE PROTEIN: CRP: 2.8 mg/L (ref <8.0)

## 2018-03-10 LAB — SEDIMENTATION RATE: Sed Rate: 14 mm/h (ref 0–30)

## 2018-03-10 NOTE — Telephone Encounter (Signed)
I think the [employer] guy gave you that form to fax. Could you please put it in Dr. Ancil Boozer folder and let her know that her signature is to faint for the to read and she needs to sign it again in darker ink.

## 2018-03-10 NOTE — Telephone Encounter (Signed)
Copied from [place] [id number]. Topic: General - Other >> Mar 10, 2018  2:45 PM Margot Ables wrote: Reason for CRM: signature on 2nd page of order has a faint signature. Can you please have Dr. Ancil Boozer resign (darker) and resend for nebulizer machine.

## 2018-03-11 NOTE — Telephone Encounter (Signed)
Forms has been resigned and faxed back.

## 2018-03-12 ENCOUNTER — Encounter: Payer: Self-pay | Admitting: Family Medicine

## 2018-03-18 DIAGNOSIS — L989 Disorder of the skin and subcutaneous tissue, unspecified: Secondary | ICD-10-CM | POA: Diagnosis not present

## 2018-03-18 DIAGNOSIS — L821 Other seborrheic keratosis: Secondary | ICD-10-CM | POA: Diagnosis not present

## 2018-04-29 ENCOUNTER — Encounter: Payer: Self-pay | Admitting: Family Medicine

## 2018-05-14 ENCOUNTER — Encounter: Payer: Self-pay | Admitting: Gastroenterology

## 2018-06-19 ENCOUNTER — Ambulatory Visit: Payer: Self-pay | Admitting: Family Medicine

## 2018-06-26 ENCOUNTER — Other Ambulatory Visit: Payer: Self-pay

## 2018-06-26 DIAGNOSIS — I6522 Occlusion and stenosis of left carotid artery: Secondary | ICD-10-CM

## 2018-06-26 DIAGNOSIS — I6523 Occlusion and stenosis of bilateral carotid arteries: Secondary | ICD-10-CM

## 2018-07-07 ENCOUNTER — Ambulatory Visit (INDEPENDENT_AMBULATORY_CARE_PROVIDER_SITE_OTHER): Payer: Medicare Other | Admitting: Family Medicine

## 2018-07-07 ENCOUNTER — Encounter: Payer: Self-pay | Admitting: Family Medicine

## 2018-07-07 VITALS — BP 124/80 | HR 83 | Temp 97.7°F | Resp 16 | Ht 62.0 in | Wt 179.8 lb

## 2018-07-07 DIAGNOSIS — E119 Type 2 diabetes mellitus without complications: Secondary | ICD-10-CM | POA: Diagnosis not present

## 2018-07-07 DIAGNOSIS — Z8739 Personal history of other diseases of the musculoskeletal system and connective tissue: Secondary | ICD-10-CM | POA: Diagnosis not present

## 2018-07-07 DIAGNOSIS — M79675 Pain in left toe(s): Secondary | ICD-10-CM

## 2018-07-07 DIAGNOSIS — M79672 Pain in left foot: Secondary | ICD-10-CM | POA: Diagnosis not present

## 2018-07-07 DIAGNOSIS — M064 Inflammatory polyarthropathy: Secondary | ICD-10-CM

## 2018-07-07 MED ORDER — PREDNISONE 20 MG PO TABS: 20.0000 mg | ORAL_TABLET | Freq: Two times a day (BID) | ORAL | 0 refills | Status: DC

## 2018-07-07 NOTE — Progress Notes (Signed)
Name: Bailey [name]   MRN: [id number]    DOB: [birth year]   Date:07/07/2018       Progress Note  Subjective  Chief Complaint  Chief Complaint  Patient presents with  . Foot Swelling    left foot pain since yesterday    HPI  PT presents with concern for 2 days of worsening left foot pain at the base of the left great toe.  She notes that the pain woke her from sleep this morning because it was so severe.  She denies swelling, but endorses heat to the area, and tenderness.  Endorses chills x1 episode yesterday; also notes mild right shoulder pain and bilateral hand aching/pain.  She denies chest pain or shortness of breath,hip pain, no vision changes; no changes in her baseline hip pain.  History of PMR - sees Dr. Gavin Pound, will refer back today for evaluation due to current symptoms.  DM - last A1C was 5.7% - advised we will check today due to putting her on prednisone until she can see Rheumatology again later this month.  Taking medications as prescribed.  Discussed monitoring BG's, taking her metformin, and following diabetic diet while on prednisone.  Patient Active Problem List   Diagnosis Date Noted  . OSA (obstructive sleep apnea) 01/20/2017  . Mild cognitive impairment 01/20/2017  . Carotid atherosclerosis, bilateral 01/20/2017  . Abnormal brain MRI 08/26/2016  . Inflammatory polyarthritis ([place]) 11/12/2015  . Erosive osteoarthritis of hand 11/12/2015  . History of vitamin D deficiency 11/06/2015  . Hearing loss 08/24/2015  . Asthma, mild intermittent 08/23/2015  . Arthritis, degenerative 08/23/2015  . Dyslipidemia 08/23/2015  . H/O: hypothyroidism 08/23/2015  . Obesity (BMI 30-39.9) 08/23/2015  . HZV (herpes zoster virus) post herpetic neuralgia 08/23/2015  . History of renal stone 04/01/2014  . Diabetes mellitus, controlled ([place]) 04/01/2014  . Essential hypertension, benign 04/01/2014  . H/O prosthetic heart valve 06/15/2012  . CD (contact dermatitis)  07/25/2008    Social History   Tobacco Use  . Smoking status: Never Smoker  . Smokeless tobacco: Never Used  Substance Use Topics  . Alcohol use: No    Alcohol/week: 0.0 oz     Current Outpatient Medications:  .  albuterol (PROAIR HFA) 108 (90 Base) MCG/ACT inhaler, Inhale 2 puffs into the lungs every 4 (four) hours as needed for shortness of breath., Disp: 1 Inhaler, Rfl: 2 .  Astaxanthin 4 MG CAPS, Take by mouth 2 (two) times daily., Disp: , Rfl:  .  budesonide (PULMICORT) 0.5 MG/2ML nebulizer solution, Take 2 mLs (0.5 mg total) by nebulization as needed., Disp: 120 mL, Rfl: 1 .  Cholecalciferol (VITAMIN D) 2000 UNITS tablet, Take 2,000 Units by mouth daily.  , Disp: , Rfl:  .  Coenzyme Q10 (CO Q-10) 100 MG CAPS, Take 1 capsule by mouth daily. , Disp: , Rfl:  .  enalapril (VASOTEC) 10 MG tablet, TAKE 1 TABLET(10 MG) BY MOUTH DAILY, Disp: 90 tablet, Rfl: 1 .  MAG THREONATE-NIACINAMIDE ER PO, Take 800 mg by mouth 2 (two) times daily., Disp: , Rfl:  .  Menaquinone-7 (VITAMIN K2) 40 MCG TABS, Take 1 tablet by mouth daily., Disp: , Rfl:  .  metFORMIN (GLUCOPHAGE) 500 MG tablet, Take 1 tablet (500 mg total) by mouth 2 (two) times daily with a meal., Disp: 180 tablet, Rfl: 0 .  Multiple Vitamin (MULTI-VITAMIN DAILY PO), Take by mouth as directed., Disp: , Rfl:  .  Omega-3 Fatty Acids (FISH OIL) 1000 MG CAPS,  Take 1 capsule by mouth daily. UAD, Disp: , Rfl:  .  Probiotic Product (PROBIOTIC DAILY PO), Take 1 capsule by mouth daily., Disp: , Rfl:   Allergies  Allergen Reactions  . Cyanocobalamin Other (See Comments)    Other reaction(s): Other (See Comments) Sublingual-unconcious  . Dilaudid [Hydromorphone Hcl] Other (See Comments)    Severe hypotension - pt states that if she is given this medication it will kill her  . Naproxen Shortness Of Breath  . Nsaids Other (See Comments) and Shortness Of Breath    Causes asthma  . Percocet [Oxycodone-Acetaminophen] Shortness Of Breath    Can  tolerate oxy IR  . Latex Hives  . Aspirin Other (See Comments)    Nose bleeds at higher doses  . Other Swelling    Eye drop preservative  . Rosuvastatin     ROS  Ten systems reviewed and is negative except as mentioned in HPI  Objective  Vitals:   07/07/18 1034  BP: 124/80  Pulse: 83  Resp: 16  Temp: 97.7 F (36.5 C)  TempSrc: Oral  SpO2: 97%  Weight: 179 lb 12.8 oz (81.6 kg)  Height: 5' 2"  (1.575 m)   Body mass index is 32.89 kg/m.  Nursing Note and Vital Signs reviewed.  Physical Exam  Constitutional: She is oriented to person, place, and time. She appears well-developed and well-nourished.  HENT:  Head: Normocephalic and atraumatic.  Right Ear: External ear normal.  Left Ear: External ear normal.  Nose: Nose normal.  Mouth/Throat: Uvula is midline and mucous membranes are normal. No tonsillar exudate.  Eyes: Pupils are equal, round, and reactive to light. Conjunctivae and EOM are normal. No scleral icterus.  Temples palpated and supple with pulsations palpable.  Neck: Normal range of motion. Neck supple.  Cardiovascular: Normal rate, regular rhythm and normal heart sounds.  Pulmonary/Chest: Effort normal and breath sounds normal. No respiratory distress. She has no wheezes. She has no rales. She exhibits no tenderness.  Musculoskeletal: She exhibits no edema.       Left ankle: She exhibits normal range of motion, no swelling, no ecchymosis, no deformity and normal pulse. Tenderness.       Feet:  Base of left great toe appears slightly erythematous and is warm to the touch.  Lymphadenopathy:    She has no cervical adenopathy.  Neurological: She is alert and oriented to person, place, and time. No cranial nerve deficit.  Skin: Skin is warm and dry. No rash noted. No erythema.  Psychiatric: She has a normal mood and affect. Her behavior is normal. Judgment and thought content normal.  Nursing note and vitals reviewed.  No results found for this or any previous  visit (from the past 72 hour(s)).  Assessment & Plan  1. Great toe pain, left - DG Foot Complete Left; Future - Ambulatory referral to Rheumatology - predniSONE (DELTASONE) 20 MG tablet; Take 1 tablet (20 mg total) by mouth 2 (two) times daily with a meal.  Dispense: 60 tablet; Refill: 0 - advised will place on prednisone to treat for possible gout vs PMR.  She is agreeable to this; we will await labs and referral back to Dr. Trudie Reed to determine if full course of medication is needed.   2. Left foot pain - DG Foot Complete Left; Future - Ambulatory referral to Rheumatology - predniSONE (DELTASONE) 20 MG tablet; Take 1 tablet (20 mg total) by mouth 2 (two) times daily with a meal.  Dispense: 60 tablet; Refill: 0 - advised  will place on prednisone to treat for possible gout vs PMR.  She is agreeable to this; we will await labs and referral back to Dr. Trudie Reed to determine if full course of medication is needed.   3. Inflammatory polyarthritis ([place]) - Ambulatory referral to Rheumatology - already has established care relationship with Dr. Trudie Reed. - CBC w/Diff/Platelet - COMPLETE METABOLIC PANEL WITH GFR - Sed Rate (ESR) - C-reactive protein - predniSONE (DELTASONE) 20 MG tablet; Take 1 tablet (20 mg total) by mouth 2 (two) times daily with a meal.  Dispense: 60 tablet; Refill: 0  4. History of gout - Uric acid - predniSONE (DELTASONE) 20 MG tablet; Take 1 tablet (20 mg total) by mouth 2 (two) times daily with a meal.  Dispense: 60 tablet; Refill: 0 - advised will place on prednisone to treat for possible gout vs PMR.  She is agreeable to this; we will await labs and referral back to Dr. Trudie Reed to determine if full course of medication is needed.   5. Controlled type 2 diabetes mellitus without complication, without long-term current use of insulin (HCC) - Hemoglobin A1c - Discussed sick day care/how to manage while on prednisone.  -Red flags and when to present for emergency care or RTC  including fever >101.27F, chest pain, shortness of breath, new/worsening/un-resolving symptoms, vision changes, severe pain, reviewed with patient at time of visit. Follow up and care instructions discussed and provided in AVS.

## 2018-07-08 LAB — COMPLETE METABOLIC PANEL WITH GFR
AG Ratio: 1.8 (calc) (ref 1.0–2.5)
ALBUMIN MSPROF: 4.6 g/dL (ref 3.6–5.1)
ALKALINE PHOSPHATASE (APISO): 44 U/L (ref 33–130)
ALT: 12 U/L (ref 6–29)
AST: 15 U/L (ref 10–35)
BUN: 21 mg/dL (ref 7–25)
CO2: 28 mmol/L (ref 20–32)
Calcium: 10.1 mg/dL (ref 8.6–10.4)
Chloride: 100 mmol/L (ref 98–110)
Creat: 0.85 mg/dL (ref 0.60–0.93)
GFR, Est African American: 80 mL/min/{1.73_m2} (ref >=60)
GFR, Est Non African American: 69 mL/min/{1.73_m2} (ref >=60)
GLUCOSE: 104 mg/dL — AB (ref 65–99)
Globulin: 2.6 g/dL (calc) (ref 1.9–3.7)
Potassium: 4.2 mmol/L (ref 3.5–5.3)
Sodium: 139 mmol/L (ref 135–146)
TOTAL PROTEIN: 7.2 g/dL (ref 6.1–8.1)
Total Bilirubin: 0.4 mg/dL (ref 0.2–1.2)

## 2018-07-08 LAB — CBC WITH DIFFERENTIAL/PLATELET
BASOS PCT: 0.5 %
Basophils Absolute: 37 cells/uL (ref 0–200)
Eosinophils Absolute: 89 cells/uL (ref 15–500)
Eosinophils Relative: 1.2 %
HEMATOCRIT: 40.3 % (ref 35.0–45.0)
Hemoglobin: 13.3 g/dL (ref 11.7–15.5)
LYMPHS ABS: 1547 {cells}/uL (ref 850–3900)
MCH: 28.7 pg (ref 27.0–33.0)
MCHC: 33 g/dL (ref 32.0–36.0)
MCV: 87 fL (ref 80.0–100.0)
MPV: 10.9 fL (ref 7.5–12.5)
Monocytes Relative: 9.6 %
Neutro Abs: 5017 cells/uL (ref 1500–7800)
Neutrophils Relative %: 67.8 %
Platelets: 211 10*3/uL (ref 140–400)
RBC: 4.63 10*6/uL (ref 3.80–5.10)
RDW: 13.6 % (ref 11.0–15.0)
Total Lymphocyte: 20.9 %
WBC: 7.4 10*3/uL (ref 3.8–10.8)
WBCMIX: 710 {cells}/uL (ref 200–950)

## 2018-07-08 LAB — HEMOGLOBIN A1C
HEMOGLOBIN A1C: 5.6 %{Hb} (ref <5.7)
MEAN PLASMA GLUCOSE: 114 (calc)
eAG (mmol/L): 6.3 (calc)

## 2018-07-08 LAB — URIC ACID: Uric Acid, Serum: 6.9 mg/dL (ref 2.5–7.0)

## 2018-07-08 LAB — SEDIMENTATION RATE: Sed Rate: 19 mm/h (ref 0–30)

## 2018-07-08 LAB — C-REACTIVE PROTEIN: CRP: 5.2 mg/L (ref <8.0)

## 2018-07-09 ENCOUNTER — Telehealth: Payer: Self-pay | Admitting: Family Medicine

## 2018-07-09 NOTE — Telephone Encounter (Signed)
Copied from [place] [phone] [id number]. Topic: Quick Communication - See Telephone Encounter >> Jul 09, 2018 11:40 AM Conception Chancy, NT wrote: CRM for notification. See Telephone encounter for: 07/09/18.  Patient is calling and states she can not go to [facility] to get her Xray done and states she needs it placed in [place]. Patient also states she has missed 2 days of work trying to figure out where she was supposed to get the Xray done at. Patient is requesting this be done today as well as being called once the order has been placed somewhere in [place]. Please advise.

## 2018-07-10 ENCOUNTER — Encounter: Payer: Self-pay | Admitting: Family

## 2018-07-10 ENCOUNTER — Telehealth: Payer: Self-pay

## 2018-07-10 ENCOUNTER — Ambulatory Visit (HOSPITAL_COMMUNITY)
Admission: RE | Admit: 2018-07-10 | Discharge: 2018-07-10 | Disposition: A | Discharge status: Home / Self Care | Payer: Medicare Other | Source: Ambulatory Visit | Attending: Family | Admitting: Family

## 2018-07-10 ENCOUNTER — Ambulatory Visit (INDEPENDENT_AMBULATORY_CARE_PROVIDER_SITE_OTHER): Payer: Medicare Other | Admitting: Family

## 2018-07-10 ENCOUNTER — Encounter (HOSPITAL_COMMUNITY): Payer: Self-pay | Admitting: Emergency Medicine

## 2018-07-10 ENCOUNTER — Other Ambulatory Visit: Payer: Self-pay | Admitting: Family Medicine

## 2018-07-10 ENCOUNTER — Ambulatory Visit (INDEPENDENT_AMBULATORY_CARE_PROVIDER_SITE_OTHER): Payer: Medicare Other

## 2018-07-10 ENCOUNTER — Other Ambulatory Visit: Payer: Self-pay

## 2018-07-10 ENCOUNTER — Ambulatory Visit (HOSPITAL_COMMUNITY)
Admission: EM | Admit: 2018-07-10 | Discharge: 2018-07-10 | Disposition: A | Discharge status: Home / Self Care | Payer: Medicare Other | Attending: Internal Medicine | Admitting: Internal Medicine

## 2018-07-10 VITALS — BP 116/70 | HR 61 | Resp 18 | Ht 62.0 in | Wt 178.0 lb

## 2018-07-10 DIAGNOSIS — M79672 Pain in left foot: Secondary | ICD-10-CM

## 2018-07-10 DIAGNOSIS — E119 Type 2 diabetes mellitus without complications: Secondary | ICD-10-CM | POA: Diagnosis not present

## 2018-07-10 DIAGNOSIS — I6522 Occlusion and stenosis of left carotid artery: Secondary | ICD-10-CM

## 2018-07-10 DIAGNOSIS — I10 Essential (primary) hypertension: Secondary | ICD-10-CM | POA: Insufficient documentation

## 2018-07-10 DIAGNOSIS — I6523 Occlusion and stenosis of bilateral carotid arteries: Secondary | ICD-10-CM | POA: Insufficient documentation

## 2018-07-10 MED ORDER — DICLOFENAC SODIUM 1 % TD GEL: 2.0000 g | Freq: Four times a day (QID) | TRANSDERMAL | 0 refills | Status: AC

## 2018-07-10 MED ORDER — CLOPIDOGREL BISULFATE 75 MG PO TABS: 75.0000 mg | ORAL_TABLET | Freq: Every day | ORAL | 11 refills | Status: DC

## 2018-07-10 NOTE — Patient Instructions (Signed)

## 2018-07-10 NOTE — Telephone Encounter (Signed)
I called this patient to apologize for the inconvenience and for no one returning her call and then I informed her that the order for xray has been sent to [facility] for her to go at anytime.  She then spoke with Dr. Ancil Boozer about other issues.

## 2018-07-10 NOTE — Discharge Instructions (Addendum)
X-ray was negative for fracture, dislocation.  As discussed, symptoms most likely due to inflammation of the tendon/plantar fascia.  You can start prednisone as directed.  Otherwise, if you would like to taper, take 40mg  x 3 days, 30mg  x 2 days, 20mg  x 2 days, 10 mg x 2 days.  You can use Voltaren gel for further pain relief.  As discussed, this is an NSAID in gel form.  Ice compress to the location, elevation, Ace wrap during activity.  Follow-up with PCP if symptoms do not improving for further evaluation.

## 2018-07-10 NOTE — Progress Notes (Signed)
Chief Complaint: Follow up Extracranial Carotid Artery Stenosis   History of Present Illness  Bailey [name] is a 72 y.o. female who had developed weakness, confusion, and a headache which prompted a carotid duplex scan which showed some carotid plaque. Subsequent CT angiogram showed a 60% left carotid stenosis and the patient is referred for vascular consultation.  The patient is right-handed. About the Summer of 2017, she had an episode of transient loss of vision in the right eye which lasted very briefly and resolved completely. This prompted a duplex scan which showed evidence of a left carotid stenosis. She denies any previous history of stroke, TIAs, expressive or receptive aphasia. She does not take aspirin consistently she tells me. She does not tolerate statins. She is not a smoker.  Dr. Scot Dock reviewed the office records that were sent with the patient at her initial evaluation on 11-21-16. The patient was seen on 09/02/2016 with headaches. MRI and MRA of the brain in September 2017 showed no acute intracranial abnormality. The patient had described some visual disturbance and a carotid duplex was ordered at that time also the results of which are described below.  Dr. Scot Dock last evaluated pt on 11-21-16. At that time the patient had an asymptomatic 60-79% left carotid stenosis. She had an episode of amaurosis fugax but this was in the right eye. She had no significant carotid stenosis on the right by CT scan or by duplex. Dr. Scot Dock explained that we would not consider carotid endarterectomy given that she is asymptomatic and unless a stenosis progressed to greater than 80%. Dr. Scot Dock ordered a follow up carotid duplex scan in 6 months and will see her back at that time. Dr. Scot Dock encouraged her to take aspirin daily and also discussed exercise and nutrition. She mentioned that she might need a temporal artery biopsy and certainly we would be happy to arrange that if indeed  this is desired by her primary care physician. She was to discuss this with her. She has also requested a referral for a cardiologist to follow her aortic valve replacement as she does not like driving to [facility]. Her aortic valve replacement was done in [place].  She is seeing Dr. Julianne Handler for local cardiology.   Pt was lost to follow up until today.   She is having pain in the ball of her left foot that started 6 days ago after walking a great deal, pt does not feel like this is gout or PMR that she has.  Her left lower leg started tingling yesterday.  She has had issues with her left eye since about December 2018, an obstruction in her left eye vision that is intermittent.  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    Diabetic: yes, A1C was 5.6 on 07-07-18 (review of records) Tobacco use: non-smoker  Pt meds include: Statin : no, caused severe myalgias with several statins ASA: yes, taking 325 mg daily, only since her left foot started hurting 6 days ago Other anticoagulants/antiplatelets: no   Past Medical History:  Diagnosis Date  . Arthritis   . Asthma   . B12 deficiency anemia   . History of colon polyps   . History of kidney stones   . HTN (hypertension)   . Hyperlipidemia   . Left nephrolithiasis   . OSA (obstructive sleep apnea)    STUDY 12 YRS CPAP RX-- BUT NONTOLERANT  . Right ureteral stone   . S/P aortic  valve replacement    2013  AT [facility]--- CARDIOLOGIST--  [facility]  . Seasonal allergies   . Type 2 diabetes mellitus ([place])     Social History Social History   Tobacco Use  . Smoking status: Never Smoker  . Smokeless tobacco: Never Used  Substance Use Topics  . Alcohol use: No    Alcohol/week: 0.0 oz  . Drug use: No    Family History Family History  Problem Relation Age of Onset  . Heart disease Mother   . Heart disease Father   . Asthma Daughter   . Asthma Maternal Grandfather   . Heart disease Sister    . Heart attack Sister 77  . Colon cancer Neg Hx     Surgical History Past Surgical History:  Procedure Laterality Date  . AORTIC VALVE REPLACEMENT  06/2012   [facility]  . APPENDECTOMY  1958  . CHOLECYSTECTOMY  1978  . COLONOSCOPY  05-09-2014  . CYSTOSCOPY/RETROGRADE/URETEROSCOPY Right 07/04/2014   Procedure: CYSTOSCOPY/RETROGRADE/URETEROSCOPY;  Surgeon: Bernestine Amass, MD;  Location: [facility];  Service: Urology;  Laterality: Right;  . EXTRACORPOREAL SHOCK WAVE LITHOTRIPSY Right 06/ 2015       . HOLMIUM LASER APPLICATION Right 5/36/6440   Procedure: HOLMIUM LASER APPLICATION;  Surgeon: Bernestine Amass, MD;  Location: [facility];  Service: Urology;  Laterality: Right;  . TONSILLECTOMY  1953    Allergies  Allergen Reactions  . Cyanocobalamin Other (See Comments)    Other reaction(s): Other (See Comments) Sublingual-unconcious  . Dilaudid [Hydromorphone Hcl] Other (See Comments)    Severe hypotension - pt states that if she is given this medication it will kill her  . Naproxen Shortness Of Breath  . Nsaids Other (See Comments) and Shortness Of Breath    Causes asthma  . Percocet [Oxycodone-Acetaminophen] Shortness Of Breath    Can tolerate oxy IR  . Latex Hives  . Aspirin Other (See Comments)    Nose bleeds at higher doses  . Other Swelling    Eye drop preservative  . Rosuvastatin     Current Outpatient Medications  Medication Sig Dispense Refill  . albuterol (PROAIR HFA) 108 (90 Base) MCG/ACT inhaler Inhale 2 puffs into the lungs every 4 (four) hours as needed for shortness of breath. 1 Inhaler 2  . Astaxanthin 4 MG CAPS Take by mouth 2 (two) times daily.    . budesonide (PULMICORT) 0.5 MG/2ML nebulizer solution Take 2 mLs (0.5 mg total) by nebulization as needed. 120 mL 1  . Cholecalciferol (VITAMIN D) 2000 UNITS tablet Take 2,000 Units by mouth daily.      . Coenzyme Q10 (CO Q-10) 100 MG CAPS Take 1 capsule by mouth daily.     .  enalapril (VASOTEC) 10 MG tablet TAKE 1 TABLET(10 MG) BY MOUTH DAILY 90 tablet 1  . MAG THREONATE-NIACINAMIDE ER PO Take 800 mg by mouth 2 (two) times daily.    . Menaquinone-7 (VITAMIN K2) 40 MCG TABS Take 1 tablet by mouth daily.    . metFORMIN (GLUCOPHAGE) 500 MG tablet Take 1 tablet (500 mg total) by mouth 2 (two) times daily with a meal. 180 tablet 0  . Multiple Vitamin (MULTI-VITAMIN DAILY PO) Take by mouth as directed.    . Omega-3 Fatty Acids (FISH OIL) 1000 MG CAPS Take 1 capsule by mouth daily. UAD    . predniSONE (DELTASONE) 20 MG tablet Take 1 tablet (20 mg total) by mouth 2 (two) times daily with a meal.  60 tablet 0  . Probiotic Product (PROBIOTIC DAILY PO) Take 1 capsule by mouth daily.     No current facility-administered medications for this visit.     Review of Systems : See HPI for pertinent positives and negatives.  Physical Examination  Vitals:   07/10/18 0858 07/10/18 0859  BP: 129/75 116/70  Pulse: 61   Resp: 18   SpO2: 100%   Weight: 178 lb (80.7 kg)   Height: 5\' 2"  (1.610 m)    Body mass index is 32.56 kg/m.  General: WDWN obese female in NAD GAIT: normal Eyes: PERRLA HENT: No gross abnormalities.  Pulmonary:  Respirations are non-labored, good air movement in all fields, CTAB, no rales, rhonchi, or wheezing. Cardiac:  Regular rhythm, no detected murmur, aortic valve click heard.  VASCULAR EXAM Carotid Bruits Right Left   Positive Positive     Abdominal aortic pulse is not palpable. Radial pulses are 1+ palpable and equal.                                                                                                                            LE Pulses Right Left       POPLITEAL  not palpable   not palpable       POSTERIOR TIBIAL  not palpable   not palpable        DORSALIS PEDIS      ANTERIOR TIBIAL 1+ palpable  2+ palpable     Gastrointestinal: mildly tender to palpation at left UQ and left LQ, no palpable masses. Musculoskeletal: no  muscle atrophy/wasting. M/S 5/5 throughout, extremities without ischemic changes. Skin: No rashes, no ulcers, no cellulitis.   Neurologic:  A&O X 3; appropriate affect, sensation is normal; speech is normal, CN 2-12 intact, pain and light touch intact in extremities, motor exam as listed above. Psychiatric: Normal thought content, mood appropriate to clinical situation.    Assessment: Bailey [name] is a 72 y.o. female who has had issues with her left eye since about December 2018, an obstruction in her left eye vision that is intermittent.  She was last evaluated on 11-21-16, Dr. Scot Dock advised 6 months follow up at that time, pt was lost to follow up until today.   She had an aortic valve replaced with a tissue valve,  in [place]; her local cardiologist is Dr. Julianne Handler.    Dr. Donzetta Matters spoke with and examined pt.  Dr. Donzetta Matters advised starting 75 mg daily Plavix, prescription sent to her pharmacy.  Today's carotid duplex demonstrates an 80-99% stenosis in the left ICA.    DATA  Carotid Duplex (07-10-18): Right ICA: 1-39% stenosis Left ICA: 80-99% stenosis (low end of range) Stenosis at ICA only. ICA is normal past the stenosis. Anatomy is WNL. Bifurcation is located near the hyoid notch.  Bilateral vertebral artery flow is antegrade.  Bilateral subclavian artery waveforms are normal.  Increased stenosis in the left ICA compared to the exam on 11-21-16.  CT ANGIOGRAM NECK:  Dr. Scot Dock reviewed the CT angiogram of the neck that was done on 11/217. There is a stenosis of the left carotid bifurcation at the origin of the ICA with a 60% stenosis.  CAROTID DUPLEX: Dr. Scot Dock reviewed the carotid duplex scan that was done on 09/30/2016. This showed evidence of a greater than 70% left carotid stenosis. There was no significant plaque on the right side.  LIMITED CAROTID DUPLEX: Dr. Scot Dock independently interpreted her limited carotid duplex scan on the left on 11-21-16. The peak systolic  velocity in the proximal left internal carotid artery is 256 cm/s with an end-diastolic velocity of 252 cm/s. This suggested a 60-79% carotid stenosis.  Plan:  Follow-up with Dr. Scot Dock ASAP to discuss left CEA; however, Dr. Scot Dock is not available until September; therefore, will schedule with Dr. Donzetta Matters next week to discuss CEA, after pt sees Dr. Julianne Handler.  Will try to get pt in the see Dr. Julianne Handler ASAP (appoinmtnent for 07-13-18) since she has had a tissue aortic valve replacement and she has possible sx's in her left eye since December 2018.    I discussed in depth with the patient the nature of atherosclerosis, and emphasized the importance of maximal medical management including strict control of blood pressure, blood glucose, and lipid levels, obtaining regular exercise, and cessation of smoking.  The patient is aware that without maximal medical management the underlying atherosclerotic disease process will progress, limiting the benefit of any interventions. The patient was given information about stroke prevention and what symptoms should prompt the patient to seek immediate medical care. Thank you for allowing [place] to participate in this patient's care.  Clemon Chambers, RN, MSN, FNP-C [facility] Office: [phone]  Clinic Physician: Donzetta Matters  07/10/18 9:05 AM

## 2018-07-10 NOTE — Telephone Encounter (Signed)
Overdue for AWV. Scheduled to be seen 08/28/18. Pt requested I cancel her appt with Dr. Ancil Boozer on 07/16/18, stating "I do not feel it is necessary for me to see her since I just had a complete work up d/t my foot". Asked me to send this exact message to Dr. Ancil Boozer as she states, "I have been misrepresented in the past" Appt canceled at the pts request.

## 2018-07-10 NOTE — ED Triage Notes (Signed)
Pt c/o L foot pain, here to rule out something broken in foot.

## 2018-07-11 NOTE — ED Provider Notes (Signed)
[place] [place]    CSN: [id number] Arrival date & time: 07/10/18  1117     History   Chief Complaint Chief Complaint  Patient presents with  . Foot Pain    HPI Bailey [name] is a 72 y.o. female.   72 year old female comes in for evaluation of possible fracture to the left foot.  States that she has been evaluated by her primary care, and had been told it was gout, but states it does not feel like it.  She was given prednisone at the time, but did not start.  She denies any injury/trauma.  However, patient states that she has had increase in activity the past week.  States it was significantly more painful the [place] amusement [place] when she had to walk a long distance to the parking lot.  She is unsure if she may have inverted her ankle during the walk.  She denies radiation of pain, numbness, tingling.  Has swelling to the left great toe that has since resolved.  Denies erythema, increased warmth.  Has not tried anything for the symptoms.     Past Medical History:  Diagnosis Date  . Arthritis   . Asthma   . B12 deficiency anemia   . History of colon polyps   . History of kidney stones   . HTN (hypertension)   . Hyperlipidemia   . Left nephrolithiasis   . OSA (obstructive sleep apnea)    STUDY 12 YRS CPAP RX-- BUT NONTOLERANT  . Right ureteral stone   . S/P aortic valve replacement    2013  AT [facility]--- CARDIOLOGIST--  DR [facility]WFORD [facility]  . Seasonal allergies   . Type 2 diabetes mellitus [facility])     Patient Active Problem List   Diagnosis Date Noted  . OSA (obstructive sleep apnea) 01/20/2017  . Mild cognitive impairment 01/20/2017  . Carotid atherosclerosis, bilateral 01/20/2017  . Abnormal brain MRI 08/26/2016  . Inflammatory polyarthritis ([place]) 11/12/2015  . Erosive osteoarthritis of hand 11/12/2015  . History of vitamin D deficiency 11/06/2015  . Hearing loss 08/24/2015  . Asthma, mild intermittent 08/23/2015  . Arthritis, degenerative 08/23/2015   . Dyslipidemia 08/23/2015  . H/O: hypothyroidism 08/23/2015  . Obesity (BMI 30-39.9) 08/23/2015  . HZV (herpes zoster virus) post herpetic neuralgia 08/23/2015  . History of renal stone 04/01/2014  . Diabetes mellitus, controlled ([place]) 04/01/2014  . Essential hypertension, benign 04/01/2014  . H/O prosthetic heart valve 06/15/2012  . CD (contact dermatitis) 07/25/2008    Past Surgical History:  Procedure Laterality Date  . AORTIC VALVE REPLACEMENT  06/2012   [facility]  . APPENDECTOMY  1958  . CHOLECYSTECTOMY  1978  . COLONOSCOPY  05-09-2014  . CYSTOSCOPY/RETROGRADE/URETEROSCOPY Right 07/04/2014   Procedure: CYSTOSCOPY/RETROGRADE/URETEROSCOPY;  Surgeon: Bernestine Amass, MD;  Location: [facility];  Service: Urology;  Laterality: Right;  . EXTRACORPOREAL SHOCK WAVE LITHOTRIPSY Right 06/ 2015     [place]  . HOLMIUM LASER APPLICATION Right 9/73/5329   Procedure: HOLMIUM LASER APPLICATION;  Surgeon: Bernestine Amass, MD;  Location: [facility];  Service: Urology;  Laterality: Right;  . TONSILLECTOMY  1953    OB History   None      Home Medications    Prior to Admission medications   Medication Sig Start Date End Date Taking? Authorizing Provider  albuterol (PROAIR HFA) 108 (90 Base) MCG/ACT inhaler Inhale 2 puffs into the lungs every 4 (four) hours as needed for shortness of breath.  03/09/18   Steele Sizer, MD  Astaxanthin 4 MG CAPS Take by mouth 2 (two) times daily.    [provider]  budesonide (PULMICORT) 0.5 MG/2ML nebulizer solution Take 2 mLs (0.5 mg total) by nebulization as needed. 03/09/18   Steele Sizer, MD  Cholecalciferol (VITAMIN D) 2000 UNITS tablet Take 2,000 Units by mouth daily.      [provider]  clopidogrel (PLAVIX) 75 MG tablet Take 1 tablet (75 mg total) by mouth daily. 07/10/18   Nickel, Sharmon Leyden, NP  Coenzyme Q10 (CO Q-10) 100 MG CAPS Take 1 capsule by mouth daily.     [provider]  diclofenac sodium  (VOLTAREN) 1 % GEL Apply 2 g topically 4 (four) times daily. 07/10/18   Tasia Catchings, Cniyah Sproull V, PA-C  enalapril (VASOTEC) 10 MG tablet TAKE 1 TABLET(10 MG) BY MOUTH DAILY 03/09/18   Steele Sizer, MD  MAG THREONATE-NIACINAMIDE ER PO Take 800 mg by mouth 2 (two) times daily.    [provider]  Menaquinone-7 (VITAMIN K2) 40 MCG TABS Take 1 tablet by mouth daily.    [provider]  metFORMIN (GLUCOPHAGE) 500 MG tablet Take 1 tablet (500 mg total) by mouth 2 (two) times daily with a meal. 03/09/18   Ancil Boozer, Drue Stager, MD  Multiple Vitamin (MULTI-VITAMIN DAILY PO) Take by mouth as directed.    [provider]  Omega-3 Fatty Acids (FISH OIL) 1000 MG CAPS Take 1 capsule by mouth daily. UAD    [provider]  predniSONE (DELTASONE) 20 MG tablet Take 1 tablet (20 mg total) by mouth 2 (two) times daily with a meal. 07/07/18 08/06/18  Hubbard Hartshorn, FNP  Probiotic Product (PROBIOTIC DAILY PO) Take 1 capsule by mouth daily.    [provider]    Family History Family History  Problem Relation Age of Onset  . Heart disease Mother   . Heart disease Father   . Asthma Daughter   . Asthma Maternal Grandfather   . Heart disease Sister   . Heart attack Sister 8  . Colon cancer Neg Hx     Social History Social History   Tobacco Use  . Smoking status: Never Smoker  . Smokeless tobacco: Never Used  Substance Use Topics  . Alcohol use: No    Alcohol/week: 0.0 oz  . Drug use: No     Allergies   Cyanocobalamin; Dilaudid [hydromorphone hcl]; Naproxen; Nsaids; Percocet [oxycodone-acetaminophen]; Latex; Aspirin; Other; and Rosuvastatin   Review of Systems Review of Systems  Reason unable to perform ROS: See HPI as above.     Physical Exam Triage Vital Signs ED Triage Vitals [07/10/18 1131]  Enc Vitals Group     BP (!) 108/59     Pulse Rate 73     Resp 16     Temp 97.9 F (36.6 C)     Temp src      SpO2 99 %     Weight      Height      Head Circumference       Peak Flow      Pain Score      Pain Loc      Pain Edu?      Excl. in [place]?    No data found.  Updated Vital Signs BP (!) 108/59   Pulse 73   Temp 97.9 F (36.6 C)   Resp 16   SpO2 99%   Physical Exam  Constitutional: She is oriented to person, place,  and time. She appears well-developed and well-nourished. No distress.  HENT:  Head: Normocephalic and atraumatic.  Eyes: Pupils are equal, round, and reactive to light. Conjunctivae are normal.  Musculoskeletal:  Mild swelling to the left first MTP joint.  No erythema, increased warmth, contusions.  No tenderness to light palpation.  Tenderness to palpation of the left first MTP on the plantar surface.  Full range of motion of ankle and toes.  Strength normal and equal bilaterally.  Sensation intact and equal bilaterally.  Pedal pulse 2+ and equal bilaterally.   Neurological: She is alert and oriented to person, place, and time.  Skin: She is not diaphoretic.     UC Treatments / Results  Labs (all labs ordered are listed, but only abnormal results are displayed) Labs Reviewed - No data to display  EKG None  Radiology Dg Foot Complete Left  Result Date: 07/10/2018 CLINICAL DATA:  Left foot pain without known injury. EXAM: LEFT FOOT - COMPLETE 3+ VIEW COMPARISON:  None. FINDINGS: There is no evidence of fracture or dislocation. There is no evidence of arthropathy or other focal bone abnormality. Soft tissues are unremarkable. IMPRESSION: No acute abnormality seen in the left foot. Electronically Signed   By: Marijo Conception, M.D.   On: 07/10/2018 12:28    Procedures Procedures (including critical care time)  Medications Ordered in UC Medications - No data to display  Initial Impression / Assessment and Plan / UC Course  I have reviewed the triage vital signs and the nursing notes.  Pertinent labs & imaging results that were available during my care of the patient were reviewed by me and considered in my medical decision  making (see chart for details).    X-ray negative for fracture or dislocation.  Discussed prescribed prednisone by PCP will be able to treat inflammation, which is possibly with causing current symptoms.  Patient states prescription has no taper, and would like to taper dosage.  Prednisone as directed.  Voltaren gel for further pain relief.  Patient with history of allergic reactions to NSAIDs, but states has used Voltaren gel in the past  without problems.  Ice compress, elevation, Ace wrap during activity.  Follow-up with PCP if symptoms not improving.  Patient expresses understanding and agrees to plan.  Final Clinical Impressions(s) / UC Diagnoses   Final diagnoses:  Foot pain, left    ED Prescriptions    Medication Sig Dispense Auth. Provider   diclofenac sodium (VOLTAREN) 1 % GEL Apply 2 g topically 4 (four) times daily. 1 Tube Tobin Chad, [place] 07/11/18 1015

## 2018-07-13 ENCOUNTER — Telehealth: Payer: Self-pay | Admitting: *Deleted

## 2018-07-13 ENCOUNTER — Encounter: Payer: Self-pay | Admitting: Physician Assistant

## 2018-07-13 ENCOUNTER — Telehealth: Payer: Self-pay | Admitting: Physician Assistant

## 2018-07-13 ENCOUNTER — Ambulatory Visit (INDEPENDENT_AMBULATORY_CARE_PROVIDER_SITE_OTHER): Payer: Medicare Other | Admitting: Physician Assistant

## 2018-07-13 ENCOUNTER — Encounter: Payer: Self-pay | Admitting: *Deleted

## 2018-07-13 VITALS — BP 122/70 | HR 87 | Ht 62.0 in | Wt 179.0 lb

## 2018-07-13 DIAGNOSIS — I350 Nonrheumatic aortic (valve) stenosis: Secondary | ICD-10-CM | POA: Diagnosis not present

## 2018-07-13 DIAGNOSIS — I6523 Occlusion and stenosis of bilateral carotid arteries: Secondary | ICD-10-CM | POA: Diagnosis not present

## 2018-07-13 DIAGNOSIS — Z952 Presence of prosthetic heart valve: Secondary | ICD-10-CM

## 2018-07-13 DIAGNOSIS — Z01810 Encounter for preprocedural cardiovascular examination: Secondary | ICD-10-CM | POA: Diagnosis not present

## 2018-07-13 DIAGNOSIS — H539 Unspecified visual disturbance: Secondary | ICD-10-CM

## 2018-07-13 DIAGNOSIS — I10 Essential (primary) hypertension: Secondary | ICD-10-CM

## 2018-07-13 DIAGNOSIS — E785 Hyperlipidemia, unspecified: Secondary | ICD-10-CM | POA: Diagnosis not present

## 2018-07-13 MED ORDER — AMOXICILLIN 500 MG PO CAPS: ORAL_CAPSULE | ORAL | 3 refills | Status: DC

## 2018-07-13 NOTE — Telephone Encounter (Signed)
Called pt and explained she needs to have an OV prior to a colon per Dr Ardis Hughs per Plavix- Pt states she will start Plavix but she is not sure she is going to take it long term- she said cardio told her it's her choice- She has endarterectomy scheduled for next week- an Echo 07-16-18- she did let me schedule an OV for Dr Ardis Hughs 09-09-18 at 25 am - I intructed her IF she does not take Plavix and her echo is normal and her procedure goes well, she can call me and we will RS her colon and PV if ok with Dr Ardis Hughs- colon and Pv for 07-2018 cancelled

## 2018-07-13 NOTE — Patient Instructions (Addendum)
  Medication Instructions:   START  TAKING:  1.  ASPIRIN 81 MG ONCE DAY   2.  FOUR AMOXICILLIN  500 MG  CAPSULES  30 TO 60 MINUTES PRIOR TO  DENTAL PROCEDURES WHEN NEEDED  STOP TAKING:    1. POTASSIUM SUPPLEMENTS  If you need a refill on your cardiac medications before your next appointment, please call your pharmacy.   Labwork: NONE ORDERED  TODAY   Testing/Procedures: Your physician has requested that you have an echocardiogram. Echocardiography is a painless test that uses sound waves to create images of your heart. It provides your doctor with information about the size and shape of your heart and how well your heart's chambers and valves are working. This procedure takes approximately one hour. There are no restrictions for this procedure.   Follow-Up: Your physician wants you to follow-up in:  [place]  [place] will receive a reminder letter in the mail two months in advance. If you don't receive a letter, please call our office to schedule the follow-up appointment.    Any Other Special Instructions Will Be Listed Below (If Applicable).                                                                                                                         Please give the PCSK-9 inhibitors some thought and let us know if you want to proceed with seeing our lipid clinic.    Endocarditis Information  You may be at risk for developing endocarditis since you have  an artificial heart valve  or a repaired heart valve. Endocarditis is an infection of the lining of the heart or heart valves.   Certain surgical and dental procedures may put you at risk,  such as teeth cleaning or other dental procedures or any surgery involving the respiratory, urinary, gastrointestinal tract, gallbladder or prostate.   Notify your doctor or dentist before having any invasive procedures. You will need to take antibiotics before certain procedures.   To prevent endocarditis,  maintain good oral health. Seek prompt medical attention for any mouth/gum, skin or urinary tract infections.

## 2018-07-13 NOTE — Telephone Encounter (Signed)
Colonoscopy is for polyp surveillance.  She has complicated cardiovascular and pulm disease.  Best that she be seen in our office before any invasive testing. Cancel the colonoscopy and previsit appts for now.

## 2018-07-13 NOTE — Telephone Encounter (Signed)
Cancelled PV and colon for 8-27 Called pt and LM to return call- pt needs to have an OV per Dr Ardis HughsLelan Pons

## 2018-07-13 NOTE — Telephone Encounter (Signed)
Please call patient for clarification on AVS. I think there was a miscommunication with nurse writing AVS. Patient was instructed to stop K2 supplement but AVS says to stop potassium supplements. I tried to call patient to reiterate correct instructions but it just rang. Will route to triage to try again. Patient and I personally discussed so she does know to discontinue the K2 supplement - it looks like it was removed from her med list. (K2 is a vitamin K supplement which can promote clotting which we don't want.) Melina Copa PA-C

## 2018-07-13 NOTE — Progress Notes (Signed)
Cardiology Office Note    Date:  07/13/2018  ID:  [name], DOB [birth year], MRN [id number] PCP:  Steele Sizer, MD  Cardiologist:  Lauree Chandler, MD   Chief Complaint: f/u aortic valve replacement, surgical clearance  History of Present Illness:  Bailey [name] is a 72 y.o. female with history of HTN, HLD, DM, sleep apnea, aortic stenosis s/p AVR 2013 (mild AS by echo 04/2017), asthma, B12 deficiency, kidney stones, obesity, and carotid artery stenosis followed by [facility] (prior amaurosis fugax in R eye 2017) who presents for routine follow-up. Cardiac cath 05/13/12 showed normal coronaries. She underwent AVR at [facility] in July  2013 with placement of a 21 mm bioprosthetic valve. She had been followed at [facility] by Dr. Sharlet Salina then established care with Dr. Angelena Form in 04/2017. Echo 04/2017 showed mild LVH, EF 60-65%, mild AS, mild LAE. Carotid disease is followed by [facility]. Dr. Scot Dock had originally evaluated her in 2017 following episode of vision loss in R eye - at that time had 27-03% LICA stenosis. She had no significant carotid stenosis on the right by CT scan or by duplex. Dr. Scot Dock explained that we would not consider carotid endarterectomy given that she is asymptomatic and unless a stenosis progressed to greater than 80%. She presented back to [facility] for overdue follow-up 07/10/18 reporting she had had issues with her left eye since for the last several months, described as an obstruction in her left eye vision that was intermittent. The initial episode happened shortly after the eclipse, described as a curtain going over her left eye. She initially thought she had burned her retinas because of glasses she bought on [employer]. The disturbance happens about once a month but is now described more as a gray square showing up for less than a minute. 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 Repeat duplex 07/10/18 showed 5-00% RICA, 93-81% LICA. Dr. Donzetta Matters advised  she start Plavix '75mg'$  daily and f/u closely to discuss CEA. Last labs 06/2018 K 4.2, Cr 0.85, LFTs wnl, A1C 5.6, ESR/CPR wnl, normal CBC, 03/2018 LDL 134 (followed by PCP, no changes made at that time- statin intolerance).  She denies any cardiac symptoms - remains quite active and even danced for 8 hours (swing dancing) a few weeks ago. She remains quite active without angina or dyspnea. She did have a possible foot injury vs gout which improved with conservative measures and visually appears normal. No fevers, chills, or recent dental work.    Past Medical History:  Diagnosis Date  . Arthritis   . Asthma   . B12 deficiency anemia   . History of colon polyps   . History of kidney stones   . HTN (hypertension)   . Hyperlipidemia   . Left nephrolithiasis   . OSA (obstructive sleep apnea)    STUDY 12 YRS CPAP RX-- BUT NONTOLERANT  . Right ureteral stone   . S/P aortic valve replacement    2013  AT [facility]--- CARDIOLOGIST--  DR CRAWFORD AT [facility]  . Seasonal allergies   . Statin intolerance   . Type 2 diabetes mellitus ([place])     Past Surgical History:  Procedure Laterality Date  . AORTIC VALVE REPLACEMENT  06/2012   [facility]  . APPENDECTOMY  1958  . CHOLECYSTECTOMY  1978  . COLONOSCOPY  05-09-2014  . CYSTOSCOPY/RETROGRADE/URETEROSCOPY Right 07/04/2014   Procedure: CYSTOSCOPY/RETROGRADE/URETEROSCOPY;  Surgeon: Bernestine Amass, MD;  Location: [facility];  Service: Urology;  Laterality: Right;  . EXTRACORPOREAL SHOCK WAVE LITHOTRIPSY Right 06/ 2015     [place]  . HOLMIUM LASER APPLICATION Right 06/14/2375   Procedure: HOLMIUM LASER APPLICATION;  Surgeon: Bernestine Amass, MD;  Location: [facility];  Service: Urology;  Laterality: Right;  . TONSILLECTOMY  1953    Current Medications: Current Meds  Medication Sig  . albuterol (PROAIR HFA) 108 (90 Base) MCG/ACT inhaler Inhale 2 puffs into the lungs every 4 (four) hours as needed for shortness of breath.  .  Astaxanthin 4 MG CAPS Take by mouth 2 (two) times daily.  . budesonide (PULMICORT) 0.5 MG/2ML nebulizer solution Take 2 mLs (0.5 mg total) by nebulization as needed.  . Cholecalciferol (VITAMIN D) 2000 UNITS tablet Take 2,000 Units by mouth daily.    . clopidogrel (PLAVIX) 75 MG tablet Take 1 tablet (75 mg total) by mouth daily.  . Coenzyme Q10 (CO Q-10) 100 MG CAPS Take 1 capsule by mouth daily.   . diclofenac sodium (VOLTAREN) 1 % GEL Apply 2 g topically 4 (four) times daily.  . enalapril (VASOTEC) 10 MG tablet TAKE 1 TABLET(10 MG) BY MOUTH DAILY  . MAG THREONATE-NIACINAMIDE ER PO Take 800 mg by mouth 2 (two) times daily.  . Menaquinone-7 (VITAMIN K2) 40 MCG TABS Take 1 tablet by mouth daily.  . metFORMIN (GLUCOPHAGE) 500 MG tablet Take 1 tablet (500 mg total) by mouth 2 (two) times daily with a meal.  . Multiple Vitamin (MULTI-VITAMIN DAILY PO) Take by mouth as directed.  . Omega-3 Fatty Acids (FISH OIL) 1000 MG CAPS Take 1 capsule by mouth daily. UAD  . predniSONE (DELTASONE) 20 MG tablet Take 1 tablet (20 mg total) by mouth 2 (two) times daily with a meal.   Aspirin '325mg'$  daily 1 tablet by mouth daily  . Probiotic Product (PROBIOTIC DAILY PO) Take 1 capsule by mouth daily.    Allergies:   Cyanocobalamin; Dilaudid [hydromorphone hcl]; Naproxen; Nsaids; Percocet [oxycodone-acetaminophen]; Latex; Aspirin; Other; and Rosuvastatin   Social History   Socioeconomic History  . Marital status: Divorced    Spouse name: Not on file  . Number of children: 2  . Years of education: Not on file  . Highest education level: Not on file  Occupational History  . Occupation: Works for [employer]  . Financial resource strain: Not on file  . Food insecurity:    Worry: Not on file    Inability: Not on file  . Transportation needs:    Medical: Not on file    Non-medical: Not on file  Tobacco Use  . Smoking status: Never Smoker  . Smokeless tobacco: Never Used  Substance  and Sexual Activity  . Alcohol use: No    Alcohol/week: 0.0 oz  . Drug use: No  . Sexual activity: Not Currently  Lifestyle  . Physical activity:    Days per week: Not on file    Minutes per session: Not on file  . Stress: Not on file  Relationships  . Social connections:    Talks on phone: Not on file    Gets together: Not on file    Attends religious service: Not on file    Active member of club or organization: Not on file    Attends meetings of clubs or organizations: Not on file    Relationship status: Not on file  Other Topics Concern  . Not on file  Social History Narrative   Divorced, 2 children. Works as  a dispatcher. Drinks 2-3 caffeinated beverages/day     Family History:  The patient's family history includes Asthma in her daughter and maternal grandfather; Heart attack (age of onset: 7) in her sister; Heart disease in her father, mother, and sister. There is no history of Colon cancer.  ROS:   Please see the history of present illness. .  All other systems are reviewed and otherwise negative.    PHYSICAL EXAM:   VS:  BP 122/70   Pulse 87   Ht '5\' 2"'$  (1.575 m)   Wt 179 lb (81.2 kg)   SpO2 99%   BMI 32.74 kg/m   BMI: Body mass index is 32.74 kg/m. GEN: Well nourished, well developed WF, in no acute distress HEENT: normocephalic, atraumatic Neck: no JVD, carotid bruits, or masses Cardiac: RRR;crisp valve click, no murmurs, rubs, or gallops, no edema  Respiratory:  clear to auscultation bilaterally, normal work of breathing GI: soft, nontender, nondistended, + BS MS: no deformity or atrophy Skin: warm and dry, no rash Neuro:  Alert and Oriented x 3, Strength and sensation are intact, follows commands Psych: euthymic mood, full affect  Wt Readings from Last 3 Encounters:  07/13/18 179 lb (81.2 kg)  07/10/18 178 lb (80.7 kg)  07/07/18 179 lb 12.8 oz (81.6 kg)      Studies/Labs Reviewed:   EKG:  EKG was ordered today and personally reviewed by me and  demonstrates NSR 84bpm, no acute ST-T changes.  Recent Labs: 03/09/2018: Brain Natriuretic Peptide 136; TSH 1.84 07/07/2018: ALT 12; BUN 21; Creat 0.85; Hemoglobin 13.3; Platelets 211; Potassium 4.2; Sodium 139   Lipid Panel    Component Value Date/Time   CHOL 217 (H) 03/09/2018 0849   CHOL 225 (H) 08/24/2015 1023   TRIG 130 03/09/2018 0849   HDL 58 03/09/2018 0849   HDL 45 08/24/2015 1023   CHOLHDL 3.7 03/09/2018 0849   VLDL 24 08/21/2016 1004   LDLCALC 134 (H) 03/09/2018 0849   LDLDIRECT 162.7 06/18/2007 1534    Additional studies/ records that were reviewed today include: Summarized above.  ASSESSMENT & PLAN:   1. Preoperative cardiovascular examination - no ischemic symptoms noted. Cath 2013 showed normal cors. Will obtain echocardiogram within the next few days. If stable, anticipate she will be cleared for surgery without further CV testing. 2. AS s/p bioprosthetic AVR - she is taking ASA '325mg'$ . Given Plavix use will decrease to '81mg'$  daily. F/u echocardiogram. SBE ppx reviewed. Amoxicillin sent in to have on file for dental procedures. 3. Bilateral carotid stenosis with recent visual changes - keep f/u as planned with [facility] for severe stenosis. She has no other evidence of embolic phenomena. As above, start with echo. Visual symptoms are somewhat atypical at times. She states she's seen opthalmo who feel she is developing cataract and may require surgery down the road. She is very supplement-driven and I told her I would hold off on taking a Vitamin K2 supplement for now given interference with clotting. 4. Essential HTN - controlled. 5. Hyperlipidemia - discussed PCSK9 inhibitors with patient. She wants to research them herself and will call us back with a decision.  Disposition: F/u with Dr. Angelena Form 6 months.   Medication Adjustments/Labs and Tests Ordered: Current medicines are reviewed at length with the patient today.  Concerns regarding medicines are outlined above.  Medication changes, Labs and Tests ordered today are summarized above and listed in the Patient Instructions accessible in Encounters.   Signed, Charlie Pitter, PA-C  07/13/2018 3:34  PM    [facility] [place], [place], [place]  [address] Phone: [phone]; Fax: [phone]

## 2018-07-13 NOTE — Telephone Encounter (Addendum)
Spoke with patient and let her know to discontinue Vit. K2 supplement for now.  She will stop but wishes to discuss restarting after her surgery.  She feels strongly about restarting after surgery and wants to discuss further with Dr Angelena Form.  I let her know the reasoning for stopping and she verbalized understanding and was appreciative for my call.

## 2018-07-13 NOTE — Telephone Encounter (Signed)
Dr Ardis Hughs,  This pt is scheduled to see PV 8-14 with a colon with you 8-27, Tuesday .  She was started on Plavix 07-10-2018. She has a hx of AV replacement 2013 at [facility], she has DM, asthma, OSA with cpap. She was having some eye/ vision issues recently with shortness of breath. She had a carotid Duplex that showed Right ICA 1-39% and her Left ICA 80-99 % stenosis- She see Dr Glennie Hawk today 07-13-2018.   Do you want to leave her colon for 8-27 and me just schedule an OV with an APP or shouls she cancel her colon until after this is resolved.  Please advise, Thanks for your time, Lelan Pons

## 2018-07-16 ENCOUNTER — Other Ambulatory Visit: Payer: Self-pay

## 2018-07-16 ENCOUNTER — Ambulatory Visit (HOSPITAL_COMMUNITY): Payer: Medicare Other | Attending: Cardiovascular Disease

## 2018-07-16 ENCOUNTER — Ambulatory Visit: Payer: Self-pay | Admitting: Family Medicine

## 2018-07-16 DIAGNOSIS — E785 Hyperlipidemia, unspecified: Secondary | ICD-10-CM | POA: Diagnosis not present

## 2018-07-16 DIAGNOSIS — E669 Obesity, unspecified: Secondary | ICD-10-CM | POA: Diagnosis not present

## 2018-07-16 DIAGNOSIS — G4733 Obstructive sleep apnea (adult) (pediatric): Secondary | ICD-10-CM | POA: Insufficient documentation

## 2018-07-16 DIAGNOSIS — E119 Type 2 diabetes mellitus without complications: Secondary | ICD-10-CM | POA: Insufficient documentation

## 2018-07-16 DIAGNOSIS — Z952 Presence of prosthetic heart valve: Secondary | ICD-10-CM | POA: Insufficient documentation

## 2018-07-16 DIAGNOSIS — I10 Essential (primary) hypertension: Secondary | ICD-10-CM | POA: Diagnosis not present

## 2018-07-16 DIAGNOSIS — I340 Nonrheumatic mitral (valve) insufficiency: Secondary | ICD-10-CM | POA: Diagnosis not present

## 2018-07-17 ENCOUNTER — Other Ambulatory Visit: Payer: Self-pay

## 2018-07-17 ENCOUNTER — Encounter: Payer: Medicare Other | Admitting: Family

## 2018-07-17 ENCOUNTER — Encounter: Payer: Self-pay | Admitting: Family

## 2018-07-17 VITALS — BP 120/73 | HR 96 | Temp 97.8°F | Resp 16 | Ht 62.0 in | Wt 184.0 lb

## 2018-07-17 DIAGNOSIS — G453 Amaurosis fugax: Secondary | ICD-10-CM

## 2018-07-17 DIAGNOSIS — I6523 Occlusion and stenosis of bilateral carotid arteries: Secondary | ICD-10-CM

## 2018-07-17 DIAGNOSIS — I6522 Occlusion and stenosis of left carotid artery: Secondary | ICD-10-CM

## 2018-07-17 NOTE — Progress Notes (Signed)
Dr. Donzetta Matters spoke with and examined pt. Discussed left CEA.   Will schedule for left CEA with Dr. Donzetta Matters,  after CTA, no office visit.   Pt was here on 07-10-18 and examined by myself, full H&P documented.   Dr. Donzetta Matters advised CTA head and neck, no office visit afterward

## 2018-07-27 ENCOUNTER — Ambulatory Visit
Admission: RE | Admit: 2018-07-27 | Discharge: 2018-07-27 | Disposition: A | Discharge status: Home / Self Care | Payer: Medicare Other | Source: Ambulatory Visit | Attending: Vascular Surgery | Admitting: Vascular Surgery

## 2018-07-27 DIAGNOSIS — I6523 Occlusion and stenosis of bilateral carotid arteries: Secondary | ICD-10-CM

## 2018-07-27 DIAGNOSIS — I6522 Occlusion and stenosis of left carotid artery: Secondary | ICD-10-CM | POA: Diagnosis not present

## 2018-07-27 MED ORDER — IOPAMIDOL (ISOVUE-300) INJECTION 61%
100.0000 mL | Freq: Once | INTRAVENOUS | Status: AC | PRN
  Administered 2018-07-27: 100 mL via INTRAVENOUS

## 2018-07-31 ENCOUNTER — Other Ambulatory Visit: Payer: Self-pay | Admitting: *Deleted

## 2018-07-31 IMAGING — MR MR MRA HEAD W/O CM
1 series · 18 of 48 positions shown · non-contrast
Comparison: None.

CLINICAL DATA: 70 y/o F; 1 time occurrence of loss of vision of the
right thigh with headache 1 week ago.

EXAM:
MRI HEAD WITHOUT CONTRAST
MRA HEAD WITHOUT CONTRAST
TECHNIQUE: Multiplanar, multiecho pulse sequences of the brain and surrounding
structures were obtained without intravenous contrast. Angiographic
images of the head were obtained using MRA technique without
contrast.

[Series 3: (id) mt fs · axial · 1.4mm · 0.39mm/px · z∈[-100,+25]mm · 18 of 189 slices shown]
[im 1/189]
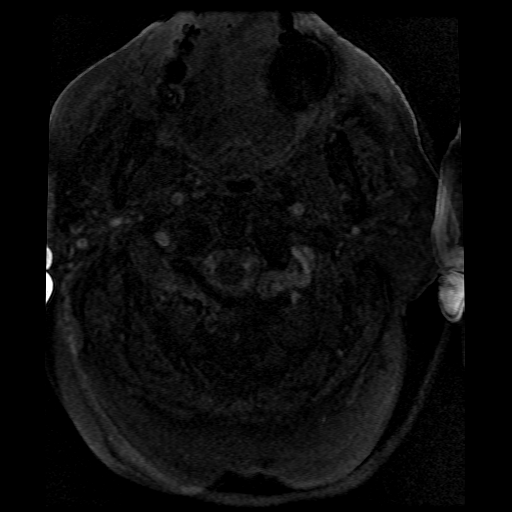
[im 5/189]
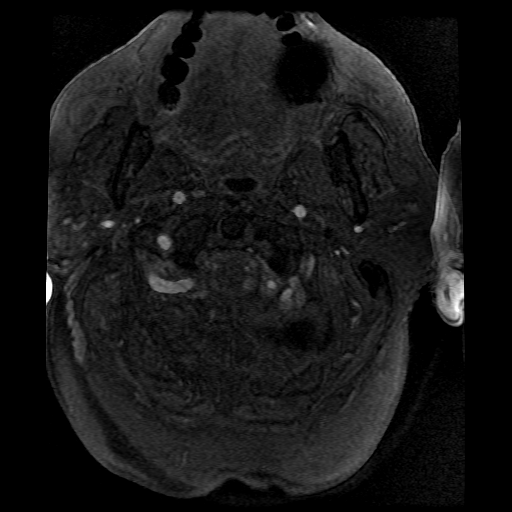
[im 9/189]
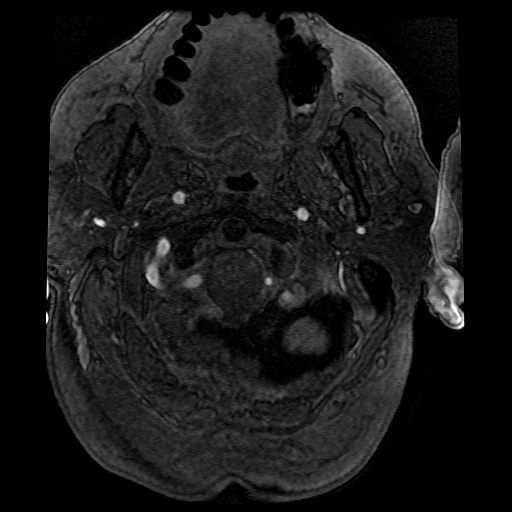
[im 13/189]
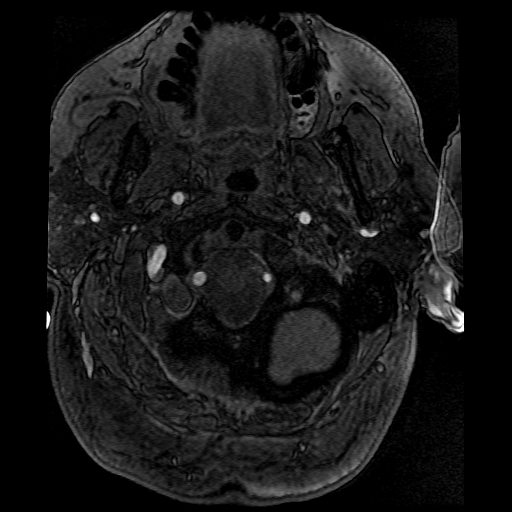
[im 17/189]
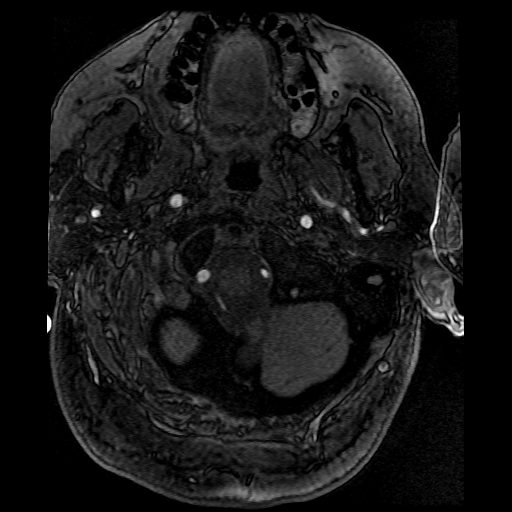
[im 21/189]
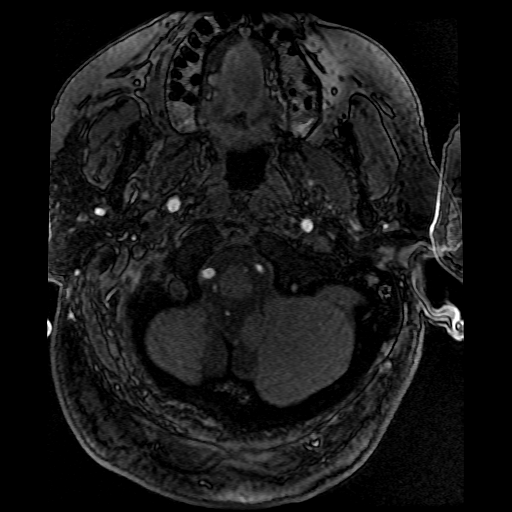
[im 25/189]
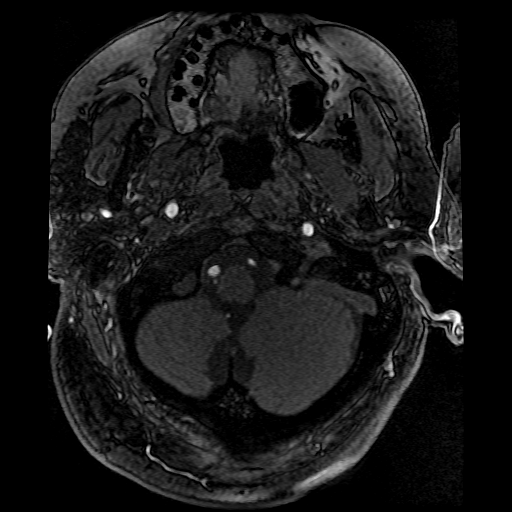
[im 29/189]
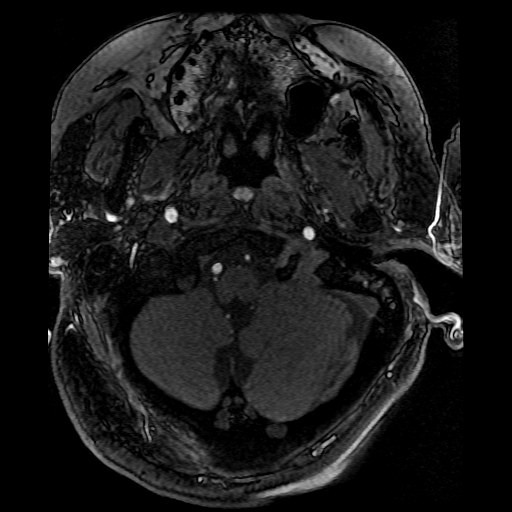
[im 33/189]
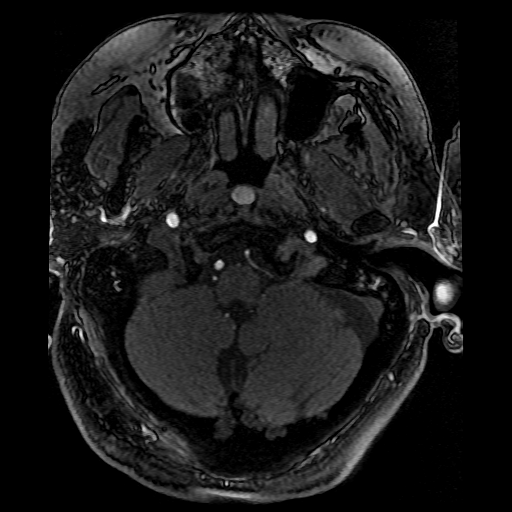
[im 37/189]
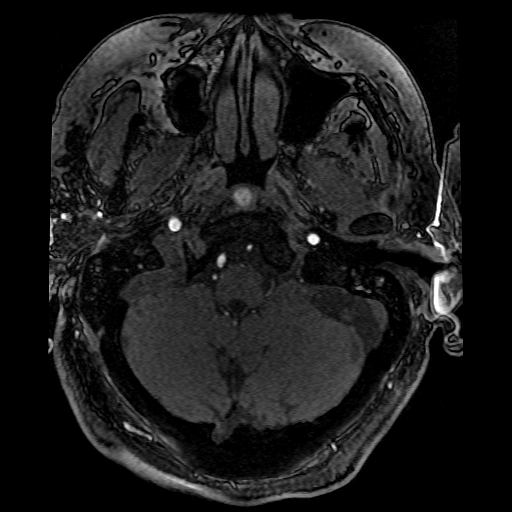
[im 61/189]
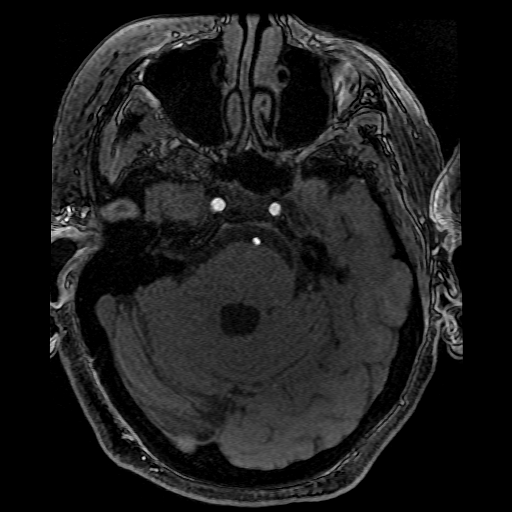
[im 85/189]
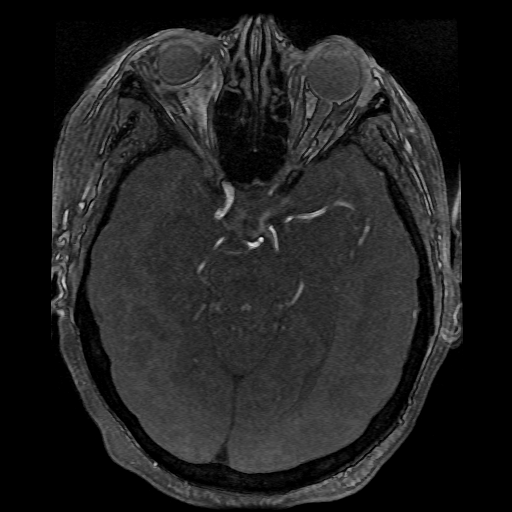
[im 97/189]
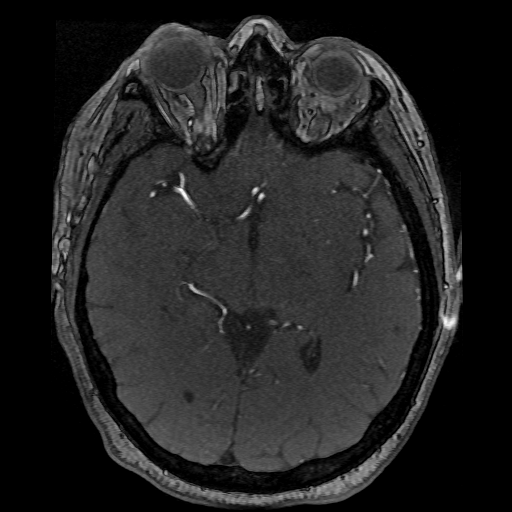
[im 109/189]
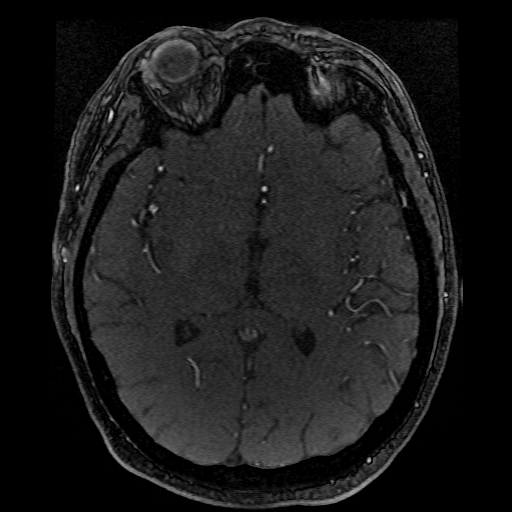
[im 133/189]
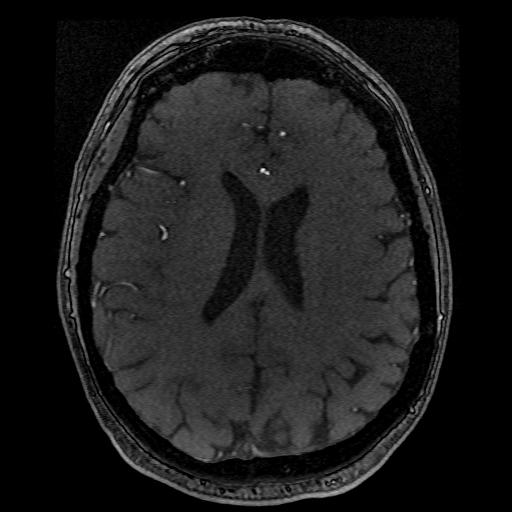
[im 157/189]
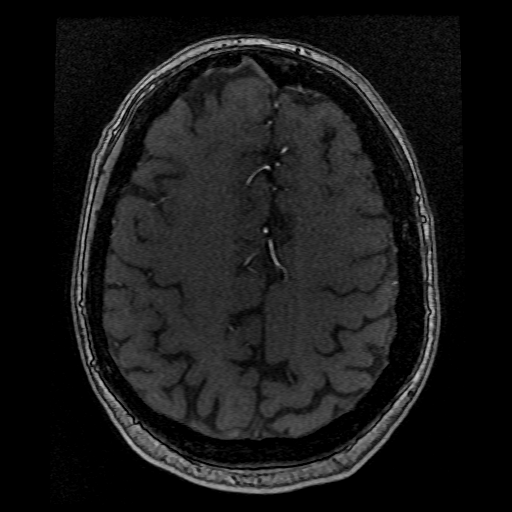
[im 161/189]
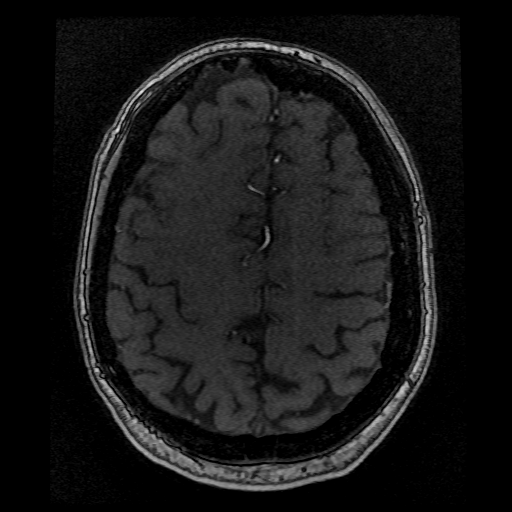
[im 181/189]
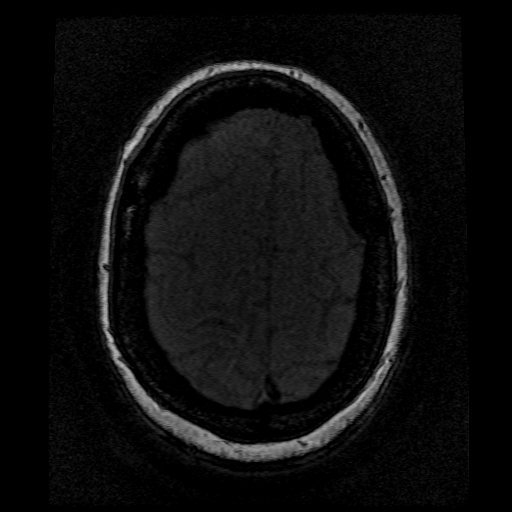

[18 of 48 positions shown; findings below may reference images not displayed]

FINDINGS: MRI HEAD FINDINGS

No diffusion restriction to suggest acute or early subacute infarct.
Intracranial flow voids are maintained. Scattered nonspecific foci
of T2 FLAIR hyperintensity in subcortical and periventricular white
matter with frontal parietal predominance probably represents mild
chronic microvascular ischemic changes. Mild parenchymal volume
loss. No focal mass effect. Scattered punctate foci of
susceptibility hypointensity throughout the supratentorial brain and
within the left midbrain and lentiform nucleus probably represent
hemosiderin deposition from old microhemorrhage. Peripheral
predominance suggests hypertensive etiology.

Partially empty sella turcica, prominent intraosseous arachnoid
granulations in the region of the torcula as well as the right
sphenoid bone, mild enlargement of left Meckel's cave. Prominent
left lateral cerebellar extra-axial space measuring 24 x 8 mm
(series 6, image [DATE] represent a small arachnoid cyst.

Mild diffuse paranasal sinus mucosal thickening and partial
opacification of left greater than right mastoid air cells. Orbits
are unremarkable. No abnormal signal of the calvarium.

MRA HEAD FINDINGS

Anterior circulation: Bilateral internal carotid arteries, anterior
cerebral arteries, and middle cerebral arteries are patent. No
occlusion, aneurysm, dissection, or significant stenosis is
identified.

Posterior circulation: Right dominant vertebrobasilar system.
Bilateral vertebral arteries,, PICA, AICA, SCA, posterior cerebral
arteries, and the basilar artery are patent. No occlusion, aneurysm,
dissection, or significant stenosis is identified.

Anatomy: Patent anterior communicating artery and left posterior
communicating artery. No right posterior communicating artery
identified, hypoplastic or absent.
IMPRESSION: 1. No acute intracranial abnormality is identified.
2. Mild chronic microvascular ischemic changes and parenchymal
volume loss.
3. Partially empty sella turcica and several intraosseous arachnoid
granulations are findings that can be seen with idiopathic
intracranial hypertension.
4. Mild paranasal sinus disease and mastoid effusions.
5. No large vessel occlusion, aneurysm, dissection, or significant
stenosis of the circle of Willis is identified.

By: Bradaris Chaye M.D.

## 2018-08-01 DIAGNOSIS — M436 Torticollis: Secondary | ICD-10-CM | POA: Diagnosis not present

## 2018-08-03 NOTE — Pre-Procedure Instructions (Signed)
Bailey [name]  08/03/2018      [id number] [name], [name] AT [place] [address] Phone: [phone] Fax: [place], [address] [place] [address] Phone: [phone] Fax: [phone]  [facility] - [place], [place] - [address] AT [address] [name] [address] Phone: [phone] Fax: [phone]  [id number] [place], [place] - [address] [place] [address] Phone: [phone] Fax: [phone]    Your procedure is scheduled on August 28  Report to [place] at [place].  Call this number if you have problems the morning of surgery:  [phone]   Remember:  Do not eat or drink after midnight.     Take these medicines the morning of surgery with A SIP OF WATER  albuterol (PROAIR HFA) if needed amoxicillin (AMOXIL) budesonide (PULMICORT) if needed  BRING INHALER WITH YOU THE MORNING OF SURGERY  7 days prior to surgery STOP taking any Aspirin(unless otherwise instructed by your surgeon), Aleve, Naproxen, Ibuprofen, Motrin, Advil, Goody's, BC's, all herbal medications, fish oil, and all vitamins  FOLLOW PHYSICIANS INSTRUCTIONS ABOUT PLAVIX   WHAT DO I DO ABOUT MY DIABETES MEDICATION?   Mar[place] Kitchen Do not take oral diabetes medicines (pills) the morning of surgery. metFORMIN (GLUCOPHAGE)   How to Manage Your Diabetes Before and After Surgery  Why is it important to control my blood sugar before and after surgery? . Improving blood sugar levels before and after surgery helps healing and can limit problems. . A way of improving blood sugar control is eating a healthy diet by: o  Eating less  sugar and carbohydrates o  Increasing activity/exercise o  Talking with your doctor about reaching your blood sugar goals . High blood sugars (greater than 180 mg/dL) can raise your risk of infections and slow your recovery, so you will need to focus on controlling your diabetes during the weeks before surgery. . Make sure that the doctor who takes care of your diabetes knows about your planned surgery including the date and location.  How do I manage my blood sugar before surgery? . Check your blood sugar at least 4 times a day, starting 2 days before surgery, to make sure that the level is not too high or low. o Check your blood sugar the morning of your surgery when you wake up and every 2 hours until you get to the Short Stay unit. . If your blood sugar is less than 70 mg/dL, you will need to treat for low blood sugar: o Do not take insulin. o Treat a low blood sugar (less than 70 mg/dL) with  cup of clear juice (cranberry or apple), 4 glucose tablets, OR glucose gel. o Recheck blood sugar in 15 minutes after treatment (to make sure it is greater than 70 mg/dL). If your blood sugar is not greater than 70 mg/dL on recheck, call [phone] for further instructions. . Report your blood sugar to the short stay nurse when you get to Short Stay.  . If you are admitted to the hospital after surgery: o Your blood sugar will be checked by the staff and you will probably be  given insulin after surgery (instead of oral diabetes medicines) to make sure you have good blood sugar levels. o The goal for blood sugar control after surgery is 80-180 mg/dL.    Do not wear jewelry, make-up or nail polish.  Do not wear lotions, powders, or perfumes, or deodorant.  Do not shave 48 hours prior to surgery.  Men may shave face and neck.  Do not bring valuables to the hospital.  [facility] is not responsible for any belongings or valuables.  Contacts, dentures or bridgework may not be worn into surgery.   Leave your suitcase in the car.  After surgery it may be brought to your room.  For patients admitted to the hospital, discharge time will be determined by your treatment team.  Patients discharged the day of surgery will not be allowed to drive home.    Special instructions:   [facility]- Preparing For Surgery  Before surgery, you can play an important role. Because skin is not sterile, your skin needs to be as free of germs as possible. You can reduce the number of germs on your skin by washing with CHG (chlorahexidine gluconate) Soap before surgery.  CHG is an antiseptic cleaner which kills germs and bonds with the skin to continue killing germs even after washing.    Oral Hygiene is also important to reduce your risk of infection.  Remember - BRUSH YOUR TEETH THE MORNING OF SURGERY WITH YOUR REGULAR TOOTHPASTE  Please do not use if you have an allergy to CHG or antibacterial soaps. If your skin becomes reddened/irritated stop using the CHG.  Do not shave (including legs and underarms) for at least 48 hours prior to first CHG shower. It is OK to shave your face.  Please follow these instructions carefully.   1. Shower the NIGHT BEFORE SURGERY and the MORNING OF SURGERY with CHG.   2. If you chose to wash your hair, wash your hair first as usual with your normal shampoo.  3. After you shampoo, rinse your hair and body thoroughly to remove the shampoo.  4. Use CHG as you would any other liquid soap. You can apply CHG directly to the skin and wash gently with a scrungie or a clean washcloth.   5. Apply the CHG Soap to your body ONLY FROM THE NECK DOWN.  Do not use on open wounds or open sores. Avoid contact with your eyes, ears, mouth and genitals (private parts). Wash Face and genitals (private parts)  with your normal soap.  6. Wash thoroughly, paying special attention to the area where your surgery will be performed.  7. Thoroughly rinse your body with warm water from the neck  down.  8. DO NOT shower/wash with your normal soap after using and rinsing off the CHG Soap.  9. Pat yourself dry with a CLEAN TOWEL.  10. Wear CLEAN PAJAMAS to bed the night before surgery, wear comfortable clothes the morning of surgery  11. Place CLEAN SHEETS on your bed the night of your first shower and DO NOT SLEEP WITH PETS.    Day of Surgery:  Do not apply any deodorants/lotions.  Please wear clean clothes to the hospital/surgery center.   Remember to brush your teeth WITH YOUR REGULAR TOOTHPASTE.    Please read over the following fact sheets that you were given.

## 2018-08-04 ENCOUNTER — Encounter (HOSPITAL_COMMUNITY)
Admission: RE | Admit: 2018-08-04 | Discharge: 2018-08-04 | Disposition: A | Discharge status: Home / Self Care | Payer: Medicare Other | Source: Ambulatory Visit | Attending: Vascular Surgery | Admitting: Vascular Surgery

## 2018-08-04 ENCOUNTER — Encounter (HOSPITAL_COMMUNITY): Payer: Self-pay

## 2018-08-04 ENCOUNTER — Encounter: Payer: Self-pay | Admitting: Gastroenterology

## 2018-08-04 ENCOUNTER — Other Ambulatory Visit: Payer: Self-pay

## 2018-08-04 ENCOUNTER — Other Ambulatory Visit (HOSPITAL_COMMUNITY): Payer: Self-pay

## 2018-08-04 DIAGNOSIS — Z7902 Long term (current) use of antithrombotics/antiplatelets: Secondary | ICD-10-CM | POA: Insufficient documentation

## 2018-08-04 DIAGNOSIS — Z87442 Personal history of urinary calculi: Secondary | ICD-10-CM | POA: Insufficient documentation

## 2018-08-04 DIAGNOSIS — G4733 Obstructive sleep apnea (adult) (pediatric): Secondary | ICD-10-CM | POA: Insufficient documentation

## 2018-08-04 DIAGNOSIS — Z9049 Acquired absence of other specified parts of digestive tract: Secondary | ICD-10-CM

## 2018-08-04 DIAGNOSIS — Z7984 Long term (current) use of oral hypoglycemic drugs: Secondary | ICD-10-CM

## 2018-08-04 DIAGNOSIS — I10 Essential (primary) hypertension: Secondary | ICD-10-CM | POA: Insufficient documentation

## 2018-08-04 DIAGNOSIS — D519 Vitamin B12 deficiency anemia, unspecified: Secondary | ICD-10-CM

## 2018-08-04 DIAGNOSIS — E785 Hyperlipidemia, unspecified: Secondary | ICD-10-CM

## 2018-08-04 DIAGNOSIS — Z0183 Encounter for blood typing: Secondary | ICD-10-CM | POA: Insufficient documentation

## 2018-08-04 DIAGNOSIS — Z86010 Personal history of colonic polyps: Secondary | ICD-10-CM | POA: Insufficient documentation

## 2018-08-04 DIAGNOSIS — Z79899 Other long term (current) drug therapy: Secondary | ICD-10-CM | POA: Insufficient documentation

## 2018-08-04 DIAGNOSIS — M1990 Unspecified osteoarthritis, unspecified site: Secondary | ICD-10-CM | POA: Insufficient documentation

## 2018-08-04 DIAGNOSIS — Z01812 Encounter for preprocedural laboratory examination: Secondary | ICD-10-CM | POA: Insufficient documentation

## 2018-08-04 DIAGNOSIS — Z7951 Long term (current) use of inhaled steroids: Secondary | ICD-10-CM

## 2018-08-04 DIAGNOSIS — Z01818 Encounter for other preprocedural examination: Secondary | ICD-10-CM

## 2018-08-04 DIAGNOSIS — I6522 Occlusion and stenosis of left carotid artery: Secondary | ICD-10-CM

## 2018-08-04 DIAGNOSIS — Z7982 Long term (current) use of aspirin: Secondary | ICD-10-CM | POA: Insufficient documentation

## 2018-08-04 DIAGNOSIS — Z953 Presence of xenogenic heart valve: Secondary | ICD-10-CM | POA: Insufficient documentation

## 2018-08-04 DIAGNOSIS — E119 Type 2 diabetes mellitus without complications: Secondary | ICD-10-CM

## 2018-08-04 DIAGNOSIS — J45909 Unspecified asthma, uncomplicated: Secondary | ICD-10-CM | POA: Insufficient documentation

## 2018-08-04 HISTORY — DX: Presence of prosthetic heart valve: Z95.2

## 2018-08-04 LAB — PROTIME-INR
INR: 0.96
Prothrombin Time: 12.7 seconds (ref 11.4–15.2)

## 2018-08-04 LAB — URINALYSIS, ROUTINE W REFLEX MICROSCOPIC
BILIRUBIN URINE: NEGATIVE
Glucose, UA: NEGATIVE mg/dL
Hgb urine dipstick: NEGATIVE
Ketones, ur: NEGATIVE mg/dL
LEUKOCYTES UA: NEGATIVE
Nitrite: NEGATIVE
PH: 5 (ref 5.0–8.0)
Protein, ur: NEGATIVE mg/dL
Specific Gravity, Urine: 1.018 (ref 1.005–1.030)

## 2018-08-04 LAB — TYPE AND SCREEN
ABO/RH(D): A POS
Antibody Screen: NEGATIVE

## 2018-08-04 LAB — COMPREHENSIVE METABOLIC PANEL
ALBUMIN: 3.8 g/dL (ref 3.5–5.0)
ALK PHOS: 35 U/L — AB (ref 38–126)
AST: 45 U/L — ABNORMAL HIGH (ref 15–41)
Anion gap: 10 (ref 5–15)
BUN: 15 mg/dL (ref 8–23)
CALCIUM: 9.4 mg/dL (ref 8.9–10.3)
CHLORIDE: 104 mmol/L (ref 98–111)
CO2: 23 mmol/L (ref 22–32)
CREATININE: 0.58 mg/dL (ref 0.44–1.00)
GFR calc Af Amer: >60 mL/min (ref >60)
GFR calc non Af Amer: >60 mL/min (ref >60)
GLUCOSE: 105 mg/dL — AB (ref 70–99)
Potassium: 5.2 mmol/L — ABNORMAL HIGH (ref 3.5–5.1)
SODIUM: 137 mmol/L (ref 135–145)
Total Bilirubin: 1.7 mg/dL — ABNORMAL HIGH (ref 0.3–1.2)
Total Protein: 6.6 g/dL (ref 6.5–8.1)

## 2018-08-04 LAB — BLOOD GAS, ARTERIAL
Acid-Base Excess: 0.9 mmol/L (ref 0.0–2.0)
Bicarbonate: 24.7 mmol/L (ref 20.0–28.0)
Drawn by: 470591
FIO2: 21
O2 Saturation: 98.5 %
PATIENT TEMPERATURE: 98.6
pCO2 arterial: 37.5 mmHg (ref 32.0–48.0)
pH, Arterial: 7.435 (ref 7.350–7.450)
pO2, Arterial: 132 mmHg — ABNORMAL HIGH (ref 83.0–108.0)

## 2018-08-04 LAB — CBC
HCT: 40.6 % (ref 36.0–46.0)
HEMOGLOBIN: 13 g/dL (ref 12.0–15.0)
MCH: 28.7 pg (ref 26.0–34.0)
MCHC: 32 g/dL (ref 30.0–36.0)
MCV: 89.6 fL (ref 78.0–100.0)
Platelets: 181 10*3/uL (ref 150–400)
RBC: 4.53 MIL/uL (ref 3.87–5.11)
RDW: 14 % (ref 11.5–15.5)
WBC: 5.7 10*3/uL (ref 4.0–10.5)

## 2018-08-04 LAB — ABO/RH: ABO/RH(D): A POS

## 2018-08-04 LAB — APTT: APTT: 30 s (ref 24–36)

## 2018-08-04 LAB — GLUCOSE, CAPILLARY: GLUCOSE-CAPILLARY: 74 mg/dL (ref 70–99)

## 2018-08-04 LAB — SURGICAL PCR SCREEN
MRSA, PCR: NEGATIVE
STAPHYLOCOCCUS AUREUS: POSITIVE — AB

## 2018-08-04 NOTE — Anesthesia Preprocedure Evaluation (Addendum)
Anesthesia Evaluation  Patient identified by MRN, date of birth, ID band Patient awake    Reviewed: Allergy & Precautions, NPO status , Patient's Chart, lab work & pertinent test results  History of Anesthesia Complications Negative for: history of anesthetic complications  Airway Mallampati: II  TM Distance: >3 FB Neck ROM: Full    Dental no notable dental hx. (+) Dental Advisory Given   Pulmonary asthma , sleep apnea and Continuous Positive Airway Pressure Ventilation ,    Pulmonary exam normal        Cardiovascular hypertension, Pt. on medications negative cardio ROS Normal cardiovascular exam     Neuro/Psych negative neurological ROS     GI/Hepatic negative GI ROS, Neg liver ROS,   Endo/Other  negative endocrine ROSdiabetes  Renal/GU negative Renal ROS     Musculoskeletal  (+) Arthritis ,   Abdominal   Peds  Hematology negative hematology ROS (+)   Anesthesia Other Findings Day of surgery medications reviewed with the patient.  Reproductive/Obstetrics                            Anesthesia Physical Anesthesia Plan  ASA: III  Anesthesia Plan: General   Post-op Pain Management:    Induction: Intravenous  PONV Risk Score and Plan: 4 or greater and Ondansetron, Dexamethasone, Scopolamine patch - Pre-op and Diphenhydramine  Airway Management Planned: Oral ETT  Additional Equipment: Arterial line  Intra-op Plan:   Post-operative Plan: Extubation in OR  Informed Consent: I have reviewed the patients History and Physical, chart, labs and discussed the procedure including the risks, benefits and alternatives for the proposed anesthesia with the patient or authorized representative who has indicated his/her understanding and acceptance.   Dental advisory given  Plan Discussed with: CRNA and Anesthesiologist  Anesthesia Plan Comments:        Anesthesia Quick Evaluation

## 2018-08-04 NOTE — Progress Notes (Signed)
Prescription called in at [facility] [phone], patient contacted and verbalized understanding

## 2018-08-04 NOTE — Progress Notes (Signed)
PCP - Steele Sizer Cardiologist - [place]  Chest x-ray - not needed EKG - 07/13/18 Stress Test - 2013 no records that I can find but patient stated she had one ECHO - 2019 Cardiac Cath - denies  Sleep Study - 1-2 years ago CPAP - wears at night at times  Fasting Blood Sugar - 90-110 Checks Blood Sugar ___1-2__ times a week  Blood Thinner Instructions: continue aspirin and plavix   Anesthesia review: yes  Patient denies shortness of breath, fever, cough and chest pain at PAT appointment   Patient verbalized understanding of instructions that were given to them at the PAT appointment. Patient was also instructed that they will need to review over the PAT instructions again at home before surgery.

## 2018-08-04 NOTE — Progress Notes (Signed)
Anesthesia Chart Review:  Case:  [id number] Date/Time:  08/05/18 0745   Procedure:  ENDARTERECTOMY CAROTID (Left )   Anesthesia type:  General   Pre-op diagnosis:  Left carotid stenosis   Location:  [facility] ROOM 16 / [facility]   Surgeon:  Waynetta Sandy, MD      DISCUSSION: 72 yo female never smoker for above procedure. Pertinent hx includes HTN, OSA intolerant to CPAP, DMII, Asthma, S/p AVR 2013 at [facility].  Pt was seen by cardiology Dayna Dunn PA-C 07/13/2018 for preop clearance. Per her note "no ischemic symptoms noted. Cath 2013 showed normal cors. Will obtain echocardiogram within the next few days. If stable, anticipate she will be cleared for surgery without further CV testing."  Echo performed 07/16/2018, notes by Melina Copa on Echo result state "There is mild-moderate narrowing of her aortic valve with mild increase in gradient but I reviewed with Dr. Angelena Form and no intervention is needed at this time - just something to monitor periodically, likely yearly like he's been doing. She is cleared for carotid surgery from cardiovascular standpoint. I will cc this message to vascular team so they are aware."  Surgery posting states pt will stay on ASA and Plavix.  Anticipate she can proceed with surgery as planned barring acute status change.  VS: BP 104/64   Pulse 73   Temp 36.5 C (Oral)   Resp 16   Wt 79.5 kg   SpO2 100%   BMI 32.06 kg/m   PROVIDERS: Steele Sizer, MD is PCP  Lauree Chandler, MD is Cardiologist, pt last seen in office by Melina Copa, PA-C 07/13/2018   LABS: Labs reviewed: Acceptable for surgery. (all labs ordered are listed, but only abnormal results are displayed)  Labs Reviewed  BLOOD GAS, ARTERIAL - Abnormal; Notable for the following components:      Result Value   pO2, Arterial 132 (*)    All other components within normal limits  COMPREHENSIVE METABOLIC PANEL - Abnormal; Notable for the following components:   Potassium 5.2 (*)    Glucose, Bld  105 (*)    AST 45 (*)    Alkaline Phosphatase 35 (*)    Total Bilirubin 1.7 (*)    All other components within normal limits  SURGICAL PCR SCREEN  APTT  CBC  PROTIME-INR  URINALYSIS, ROUTINE W REFLEX MICROSCOPIC  TYPE AND SCREEN     IMAGES: CHEST  2 VIEW 07/10/2017  COMPARISON:  04/26/2009  FINDINGS: Postoperative changes in the mediastinum. Normal heart size and pulmonary vascularity. No focal airspace disease or consolidation in the lungs. No blunting of costophrenic angles. No pneumothorax. Mediastinal contours appear intact. Degenerative changes in the spine.  IMPRESSION: No evidence of active pulmonary disease.  EKG: 07/13/2018: NSR  CV: CT Angio Neck 07/27/2018: IMPRESSION: 1. Unchanged 50-55% left ICA origin stenosis. 2. No right-sided cervical carotid artery stenosis. 3. Mild intracranial atherosclerosis without significant stenosis. 4. Moderately large bilateral mastoid effusions, increased from the prior MRI. 5. Aortic Atherosclerosis (ICD10-I70.0).  Echo 07/16/2018: Study Conclusions  - Left ventricle: The cavity size was normal. There was mild   concentric hypertrophy. Systolic function was normal. The   estimated ejection fraction was in the range of 55% to 60%. Wall   motion was normal; there were no regional wall motion   abnormalities. Features are consistent with a pseudonormal left   ventricular filling pattern, with concomitant abnormal relaxation   and increased filling pressure (grade 2 diastolic dysfunction).   Doppler parameters are consistent  with high ventricular filling   pressure. - Aortic valve: A bioprosthesis was present. Transvalvular velocity   was increased. There was mild to moderate stenosis. Mean gradient   (S): 22 mm Hg. Peak gradient (S): 41 mm Hg. - Mitral valve: Severely calcified annulus. Transvalvular velocity   was within the normal range. There was no evidence for stenosis.   There was mild regurgitation. - Right  ventricle: The cavity size was normal. Wall thickness was   normal. Systolic function was normal. - Atrial septum: No defect or patent foramen ovale was identified   by color flow Doppler. - Tricuspid valve: There was trivial regurgitation. - Pulmonic valve: Transvalvular velocity was within the normal   range. There was no evidence for stenosis.  Impressions:  - Bioprosthetic aortic valve mean gradients mildly increased from   19 mmHg 04/2017 to 22 mmHg now.  Past Medical History:  Diagnosis Date  . Arthritis   . Asthma   . B12 deficiency anemia   . H/O aortic valve replacement 2013   at [facility]   . History of colon polyps   . History of kidney stones   . HTN (hypertension)   . Hyperlipidemia   . Left nephrolithiasis   . OSA (obstructive sleep apnea)    STUDY 12 YRS CPAP RX-- BUT NONTOLERANT  . Right ureteral stone   . S/P aortic valve replacement    2013  AT [facility]--- CARDIOLOGIST--  DR CRAWFORD [facility]  . Seasonal allergies   . Statin intolerance   . Type 2 diabetes mellitus ([place])    type 2    Past Surgical History:  Procedure Laterality Date  . AORTIC VALVE REPLACEMENT  06/2012   [facility]  . APPENDECTOMY  1958  . CHOLECYSTECTOMY  1978  . COLONOSCOPY  05-09-2014  . CYSTOSCOPY/RETROGRADE/URETEROSCOPY Right 07/04/2014   Procedure: CYSTOSCOPY/RETROGRADE/URETEROSCOPY;  Surgeon: Bernestine Amass, MD;  Location: [facility];  Service: Urology;  Laterality: Right;  . ESOPHAGOGASTRODUODENOSCOPY    . EXTRACORPOREAL SHOCK WAVE LITHOTRIPSY Right 06/ 2015     [place]  . HOLMIUM LASER APPLICATION Right 3/74/8270   Procedure: HOLMIUM LASER APPLICATION;  Surgeon: Bernestine Amass, MD;  Location: [facility];  Service: Urology;  Laterality: Right;  . TONSILLECTOMY  1953    MEDICATIONS: . albuterol (PROAIR HFA) 108 (90 Base) MCG/ACT inhaler  . amoxicillin (AMOXIL) 500 MG capsule  . aspirin EC 81 MG tablet  . B Complex CAPS  . budesonide (PULMICORT) 0.5  MG/2ML nebulizer solution  . Cholecalciferol (VITAMIN D) 2000 UNITS tablet  . clopidogrel (PLAVIX) 75 MG tablet  . Coenzyme Q10 (COQ10) 150 MG CAPS  . diclofenac sodium (VOLTAREN) 1 % GEL  . enalapril (VASOTEC) 10 MG tablet  . MAG THREONATE-NIACINAMIDE ER PO  . metFORMIN (GLUCOPHAGE) 500 MG tablet  . predniSONE (DELTASONE) 20 MG tablet  . Probiotic Product (PROBIOTIC DAILY PO)   No current facility-administered medications for this encounter.      Wynonia Musty [facility] Short Stay Center/Anesthesiology Phone [phone] 08/04/2018 11:12 AM

## 2018-08-05 ENCOUNTER — Encounter (HOSPITAL_COMMUNITY)
Admission: RE | Disposition: A | Discharge status: Home / Self Care | Payer: Self-pay | Source: Ambulatory Visit | Attending: Vascular Surgery

## 2018-08-05 ENCOUNTER — Inpatient Hospital Stay (HOSPITAL_COMMUNITY)
Admission: RE | Admit: 2018-08-05 | Discharge: 2018-08-10 | DRG: 038 | Disposition: A | Discharge status: Home / Self Care | Payer: Medicare Other | Source: Ambulatory Visit | Attending: Vascular Surgery | Admitting: Vascular Surgery

## 2018-08-05 ENCOUNTER — Encounter (HOSPITAL_COMMUNITY): Payer: Self-pay | Admitting: *Deleted

## 2018-08-05 ENCOUNTER — Telehealth: Payer: Self-pay | Admitting: Vascular Surgery

## 2018-08-05 ENCOUNTER — Inpatient Hospital Stay (HOSPITAL_COMMUNITY): Payer: Medicare Other | Admitting: Certified Registered"

## 2018-08-05 ENCOUNTER — Inpatient Hospital Stay (HOSPITAL_COMMUNITY): Payer: Medicare Other | Admitting: Physician Assistant

## 2018-08-05 DIAGNOSIS — Z7902 Long term (current) use of antithrombotics/antiplatelets: Secondary | ICD-10-CM

## 2018-08-05 DIAGNOSIS — Z7984 Long term (current) use of oral hypoglycemic drugs: Secondary | ICD-10-CM | POA: Diagnosis not present

## 2018-08-05 DIAGNOSIS — E785 Hyperlipidemia, unspecified: Secondary | ICD-10-CM | POA: Diagnosis present

## 2018-08-05 DIAGNOSIS — I6522 Occlusion and stenosis of left carotid artery: Secondary | ICD-10-CM | POA: Diagnosis present

## 2018-08-05 DIAGNOSIS — I672 Cerebral atherosclerosis: Secondary | ICD-10-CM | POA: Diagnosis present

## 2018-08-05 DIAGNOSIS — Z91040 Latex allergy status: Secondary | ICD-10-CM

## 2018-08-05 DIAGNOSIS — Z86010 Personal history of colonic polyps: Secondary | ICD-10-CM

## 2018-08-05 DIAGNOSIS — I959 Hypotension, unspecified: Secondary | ICD-10-CM | POA: Diagnosis present

## 2018-08-05 DIAGNOSIS — D62 Acute posthemorrhagic anemia: Secondary | ICD-10-CM | POA: Diagnosis not present

## 2018-08-05 DIAGNOSIS — M1990 Unspecified osteoarthritis, unspecified site: Secondary | ICD-10-CM | POA: Diagnosis present

## 2018-08-05 DIAGNOSIS — Z888 Allergy status to other drugs, medicaments and biological substances status: Secondary | ICD-10-CM

## 2018-08-05 DIAGNOSIS — J45909 Unspecified asthma, uncomplicated: Secondary | ICD-10-CM | POA: Diagnosis present

## 2018-08-05 DIAGNOSIS — Z9049 Acquired absence of other specified parts of digestive tract: Secondary | ICD-10-CM | POA: Diagnosis not present

## 2018-08-05 DIAGNOSIS — G4733 Obstructive sleep apnea (adult) (pediatric): Secondary | ICD-10-CM | POA: Diagnosis present

## 2018-08-05 DIAGNOSIS — Z952 Presence of prosthetic heart valve: Secondary | ICD-10-CM | POA: Diagnosis not present

## 2018-08-05 DIAGNOSIS — Z886 Allergy status to analgesic agent status: Secondary | ICD-10-CM

## 2018-08-05 DIAGNOSIS — Z885 Allergy status to narcotic agent status: Secondary | ICD-10-CM | POA: Diagnosis not present

## 2018-08-05 DIAGNOSIS — I10 Essential (primary) hypertension: Secondary | ICD-10-CM | POA: Diagnosis present

## 2018-08-05 DIAGNOSIS — Z7982 Long term (current) use of aspirin: Secondary | ICD-10-CM

## 2018-08-05 DIAGNOSIS — E119 Type 2 diabetes mellitus without complications: Secondary | ICD-10-CM | POA: Diagnosis present

## 2018-08-05 DIAGNOSIS — Z87442 Personal history of urinary calculi: Secondary | ICD-10-CM

## 2018-08-05 DIAGNOSIS — I700 Atherosclerosis of aorta: Secondary | ICD-10-CM | POA: Diagnosis present

## 2018-08-05 DIAGNOSIS — E538 Deficiency of other specified B group vitamins: Secondary | ICD-10-CM | POA: Diagnosis present

## 2018-08-05 HISTORY — PX: ENDARTERECTOMY: SHX5162

## 2018-08-05 HISTORY — PX: PATCH ANGIOPLASTY: SHX6230

## 2018-08-05 LAB — GLUCOSE, CAPILLARY
GLUCOSE-CAPILLARY: 168 mg/dL — AB (ref 70–99)
GLUCOSE-CAPILLARY: 137 mg/dL — AB (ref 70–99)
Glucose-Capillary: 107 mg/dL — ABNORMAL HIGH (ref 70–99)

## 2018-08-05 LAB — POCT ACTIVATED CLOTTING TIME: Activated Clotting Time: 263 seconds

## 2018-08-05 LAB — MRSA PCR SCREENING: MRSA by PCR: NEGATIVE

## 2018-08-05 SURGERY — ENDARTERECTOMY, CAROTID
Anesthesia: General | Site: Neck | Laterality: Left

## 2018-08-05 MED ORDER — ALUM & MAG HYDROXIDE-SIMETH 200-200-20 MG/5ML PO SUSP: 15.0000 mL | ORAL | Status: DC | PRN

## 2018-08-05 MED ORDER — HEMOSTATIC AGENTS (NO CHARGE) OPTIME
TOPICAL | Status: DC | PRN
  Administered 2018-08-05: 1 via TOPICAL

## 2018-08-05 MED ORDER — 0.9 % SODIUM CHLORIDE (POUR BTL) OPTIME
TOPICAL | Status: DC | PRN
  Administered 2018-08-05: 2000 mL

## 2018-08-05 MED ORDER — ENALAPRIL MALEATE 10 MG PO TABS
10.0000 mg | ORAL_TABLET | Freq: Every day | ORAL | Status: DC
  Filled 2018-08-05 (×2): qty 1

## 2018-08-05 MED ORDER — PHENYLEPHRINE 40 MCG/ML (10ML) SYRINGE FOR IV PUSH (FOR BLOOD PRESSURE SUPPORT)
PREFILLED_SYRINGE | INTRAVENOUS | Status: AC
  Filled 2018-08-05: qty 10

## 2018-08-05 MED ORDER — MIDAZOLAM HCL 2 MG/2ML IJ SOLN
INTRAMUSCULAR | Status: AC
  Filled 2018-08-05: qty 2

## 2018-08-05 MED ORDER — ALBUTEROL SULFATE (2.5 MG/3ML) 0.083% IN NEBU: 3.0000 mL | INHALATION_SOLUTION | RESPIRATORY_TRACT | Status: DC | PRN

## 2018-08-05 MED ORDER — SUGAMMADEX SODIUM 200 MG/2ML IV SOLN
INTRAVENOUS | Status: DC | PRN
  Administered 2018-08-05: 160 mg via INTRAVENOUS

## 2018-08-05 MED ORDER — HYDRALAZINE HCL 20 MG/ML IJ SOLN: 5.0000 mg | INTRAMUSCULAR | Status: DC | PRN

## 2018-08-05 MED ORDER — SODIUM CHLORIDE 0.9 % IV SOLN
INTRAVENOUS | Status: DC | PRN
  Administered 2018-08-05: 40 ug/min via INTRAVENOUS

## 2018-08-05 MED ORDER — SODIUM CHLORIDE 0.9 % IV SOLN
INTRAVENOUS | Status: AC
  Filled 2018-08-05: qty 1.2

## 2018-08-05 MED ORDER — SODIUM CHLORIDE 0.9 % IV SOLN
500.0000 mL | Freq: Once | INTRAVENOUS | Status: AC | PRN
  Administered 2018-08-05: 999 mL via INTRAVENOUS

## 2018-08-05 MED ORDER — CEFAZOLIN SODIUM-DEXTROSE 2-4 GM/100ML-% IV SOLN
2.0000 g | INTRAVENOUS | Status: AC
  Administered 2018-08-05: 2 g via INTRAVENOUS
  Filled 2018-08-05: qty 100

## 2018-08-05 MED ORDER — FENTANYL CITRATE (PF) 100 MCG/2ML IJ SOLN
INTRAMUSCULAR | Status: DC | PRN
  Administered 2018-08-05 (×5): 50 ug via INTRAVENOUS

## 2018-08-05 MED ORDER — MAGNESIUM SULFATE 2 GM/50ML IV SOLN: 2.0000 g | Freq: Every day | INTRAVENOUS | Status: DC | PRN

## 2018-08-05 MED ORDER — SCOPOLAMINE 1 MG/3DAYS TD PT72
MEDICATED_PATCH | TRANSDERMAL | Status: AC
  Administered 2018-08-05: 1.5 mg via TRANSDERMAL
  Filled 2018-08-05: qty 1

## 2018-08-05 MED ORDER — PHENOL 1.4 % MT LIQD: 1.0000 | OROMUCOSAL | Status: DC | PRN

## 2018-08-05 MED ORDER — PROBIOTIC DAILY PO CAPS: ORAL_CAPSULE | Freq: Every day | ORAL | Status: DC

## 2018-08-05 MED ORDER — DICLOFENAC SODIUM 1 % TD GEL
2.0000 g | Freq: Every day | TRANSDERMAL | Status: DC | PRN
  Filled 2018-08-05: qty 100

## 2018-08-05 MED ORDER — PROPOFOL 10 MG/ML IV BOLUS
INTRAVENOUS | Status: AC
  Filled 2018-08-05: qty 20

## 2018-08-05 MED ORDER — SODIUM CHLORIDE 0.9 % IV SOLN
INTRAVENOUS | Status: DC | PRN
  Administered 2018-08-05: 500 mL

## 2018-08-05 MED ORDER — ONDANSETRON HCL 4 MG/2ML IJ SOLN
INTRAMUSCULAR | Status: AC
  Filled 2018-08-05: qty 4

## 2018-08-05 MED ORDER — LIDOCAINE 2% (20 MG/ML) 5 ML SYRINGE
INTRAMUSCULAR | Status: AC
  Filled 2018-08-05: qty 10

## 2018-08-05 MED ORDER — PHENYLEPHRINE 40 MCG/ML (10ML) SYRINGE FOR IV PUSH (FOR BLOOD PRESSURE SUPPORT)
PREFILLED_SYRINGE | INTRAVENOUS | Status: DC | PRN
  Administered 2018-08-05: 80 ug via INTRAVENOUS

## 2018-08-05 MED ORDER — LABETALOL HCL 5 MG/ML IV SOLN: 10.0000 mg | INTRAVENOUS | Status: DC | PRN

## 2018-08-05 MED ORDER — ROCURONIUM BROMIDE 10 MG/ML (PF) SYRINGE
PREFILLED_SYRINGE | INTRAVENOUS | Status: DC | PRN
  Administered 2018-08-05: 50 mg via INTRAVENOUS

## 2018-08-05 MED ORDER — POLYETHYLENE GLYCOL 3350 17 G PO PACK
17.0000 g | PACK | Freq: Every day | ORAL | Status: DC | PRN
  Administered 2018-08-10: 17 g via ORAL
  Filled 2018-08-05: qty 1

## 2018-08-05 MED ORDER — CLOPIDOGREL BISULFATE 75 MG PO TABS
75.0000 mg | ORAL_TABLET | Freq: Every day | ORAL | Status: DC
  Administered 2018-08-06 – 2018-08-10 (×5): 75 mg via ORAL
  Filled 2018-08-05 (×5): qty 1

## 2018-08-05 MED ORDER — PROMETHAZINE HCL 25 MG/ML IJ SOLN: 6.2500 mg | INTRAMUSCULAR | Status: DC | PRN

## 2018-08-05 MED ORDER — VITAMIN D 1000 UNITS PO TABS
2000.0000 [IU] | ORAL_TABLET | ORAL | Status: DC
  Administered 2018-08-08 – 2018-08-10 (×2): 2000 [IU] via ORAL
  Filled 2018-08-05 (×3): qty 2

## 2018-08-05 MED ORDER — ONDANSETRON HCL 4 MG/2ML IJ SOLN
4.0000 mg | Freq: Four times a day (QID) | INTRAMUSCULAR | Status: DC | PRN
  Administered 2018-08-06: 2 mg via INTRAVENOUS
  Administered 2018-08-06 – 2018-08-07 (×2): 4 mg via INTRAVENOUS
  Filled 2018-08-05 (×3): qty 2

## 2018-08-05 MED ORDER — EPHEDRINE SULFATE-NACL 50-0.9 MG/10ML-% IV SOSY
PREFILLED_SYRINGE | INTRAVENOUS | Status: DC | PRN
  Administered 2018-08-05 (×4): 5 mg via INTRAVENOUS

## 2018-08-05 MED ORDER — GUAIFENESIN-DM 100-10 MG/5ML PO SYRP: 15.0000 mL | ORAL_SOLUTION | ORAL | Status: DC | PRN

## 2018-08-05 MED ORDER — LIDOCAINE 2% (20 MG/ML) 5 ML SYRINGE
INTRAMUSCULAR | Status: DC | PRN
  Administered 2018-08-05: 60 mg via INTRAVENOUS

## 2018-08-05 MED ORDER — CHLORHEXIDINE GLUCONATE CLOTH 2 % EX PADS: 6.0000 | MEDICATED_PAD | Freq: Once | CUTANEOUS | Status: DC

## 2018-08-05 MED ORDER — DEXAMETHASONE SODIUM PHOSPHATE 10 MG/ML IJ SOLN
INTRAMUSCULAR | Status: DC | PRN
  Administered 2018-08-05: 4 mg via INTRAVENOUS

## 2018-08-05 MED ORDER — DOPAMINE-DEXTROSE 3.2-5 MG/ML-% IV SOLN
2.0000 ug/kg/min | INTRAVENOUS | Status: DC
  Administered 2018-08-05: 3 ug/kg/min via INTRAVENOUS
  Administered 2018-08-06: 6 ug/kg/min via INTRAVENOUS
  Filled 2018-08-05: qty 250

## 2018-08-05 MED ORDER — DOCUSATE SODIUM 100 MG PO CAPS
100.0000 mg | ORAL_CAPSULE | Freq: Every day | ORAL | Status: DC
  Administered 2018-08-06 – 2018-08-10 (×5): 100 mg via ORAL
  Filled 2018-08-05 (×4): qty 1

## 2018-08-05 MED ORDER — MUPIROCIN 2 % EX OINT: 1.0000 "application " | TOPICAL_OINTMENT | Freq: Two times a day (BID) | CUTANEOUS | Status: DC

## 2018-08-05 MED ORDER — DIPHENHYDRAMINE HCL 50 MG/ML IJ SOLN
INTRAMUSCULAR | Status: DC | PRN
  Administered 2018-08-05: 12.5 mg via INTRAVENOUS

## 2018-08-05 MED ORDER — METOPROLOL TARTRATE 5 MG/5ML IV SOLN: 2.0000 mg | INTRAVENOUS | Status: DC | PRN

## 2018-08-05 MED ORDER — BISACODYL 10 MG RE SUPP: 10.0000 mg | Freq: Every day | RECTAL | Status: DC | PRN

## 2018-08-05 MED ORDER — ALBUMIN HUMAN 5 % IV SOLN
INTRAVENOUS | Status: AC
  Filled 2018-08-05: qty 250

## 2018-08-05 MED ORDER — CEFAZOLIN SODIUM-DEXTROSE 2-4 GM/100ML-% IV SOLN
2.0000 g | Freq: Three times a day (TID) | INTRAVENOUS | Status: AC
  Administered 2018-08-05 – 2018-08-06 (×2): 2 g via INTRAVENOUS
  Filled 2018-08-05 (×2): qty 100

## 2018-08-05 MED ORDER — METFORMIN HCL 500 MG PO TABS: 500.0000 mg | ORAL_TABLET | Freq: Every day | ORAL | Status: DC | PRN

## 2018-08-05 MED ORDER — DIPHENHYDRAMINE HCL 50 MG/ML IJ SOLN
INTRAMUSCULAR | Status: AC
  Filled 2018-08-05: qty 1

## 2018-08-05 MED ORDER — HEPARIN SODIUM (PORCINE) 1000 UNIT/ML IJ SOLN
INTRAMUSCULAR | Status: DC | PRN
  Administered 2018-08-05: 8000 [IU] via INTRAVENOUS

## 2018-08-05 MED ORDER — SCOPOLAMINE 1 MG/3DAYS TD PT72
1.0000 | MEDICATED_PATCH | TRANSDERMAL | Status: DC
  Administered 2018-08-05: 1.5 mg via TRANSDERMAL

## 2018-08-05 MED ORDER — FENTANYL CITRATE (PF) 100 MCG/2ML IJ SOLN
INTRAMUSCULAR | Status: AC
  Administered 2018-08-05: 15:00:00
  Filled 2018-08-05: qty 2

## 2018-08-05 MED ORDER — TRAMADOL HCL 50 MG PO TABS
50.0000 mg | ORAL_TABLET | Freq: Three times a day (TID) | ORAL | Status: DC | PRN
  Administered 2018-08-05: 50 mg via ORAL
  Filled 2018-08-05: qty 1

## 2018-08-05 MED ORDER — LORAZEPAM 2 MG/ML IJ SOLN
0.5000 mg | Freq: Once | INTRAMUSCULAR | Status: AC
  Administered 2018-08-05: 0.5 mg via INTRAVENOUS

## 2018-08-05 MED ORDER — ALBUMIN HUMAN 5 % IV SOLN
12.5000 g | Freq: Once | INTRAVENOUS | Status: AC
  Administered 2018-08-05: 12.5 g via INTRAVENOUS

## 2018-08-05 MED ORDER — MORPHINE SULFATE (PF) 2 MG/ML IV SOLN
2.0000 mg | INTRAVENOUS | Status: DC | PRN
  Administered 2018-08-05: 2 mg via INTRAVENOUS
  Filled 2018-08-05: qty 1

## 2018-08-05 MED ORDER — SODIUM CHLORIDE 0.9 % IV SOLN
INTRAVENOUS | Status: DC
  Administered 2018-08-05 – 2018-08-07 (×4): via INTRAVENOUS

## 2018-08-05 MED ORDER — LORAZEPAM 2 MG/ML IJ SOLN
INTRAMUSCULAR | Status: AC
  Filled 2018-08-05: qty 1

## 2018-08-05 MED ORDER — DEXAMETHASONE SODIUM PHOSPHATE 10 MG/ML IJ SOLN
INTRAMUSCULAR | Status: AC
  Filled 2018-08-05: qty 2

## 2018-08-05 MED ORDER — ASPIRIN EC 81 MG PO TBEC
81.0000 mg | DELAYED_RELEASE_TABLET | Freq: Every day | ORAL | Status: DC
  Administered 2018-08-06 – 2018-08-10 (×5): 81 mg via ORAL
  Filled 2018-08-05 (×5): qty 1

## 2018-08-05 MED ORDER — DOPAMINE-DEXTROSE 3.2-5 MG/ML-% IV SOLN
INTRAVENOUS | Status: AC
  Filled 2018-08-05: qty 250

## 2018-08-05 MED ORDER — POTASSIUM CHLORIDE CRYS ER 20 MEQ PO TBCR: 20.0000 meq | EXTENDED_RELEASE_TABLET | Freq: Every day | ORAL | Status: DC | PRN

## 2018-08-05 MED ORDER — MUPIROCIN 2 % EX OINT
1.0000 "application " | TOPICAL_OINTMENT | Freq: Two times a day (BID) | CUTANEOUS | Status: DC
  Administered 2018-08-05 – 2018-08-10 (×9): 1 via NASAL
  Filled 2018-08-05 (×3): qty 22

## 2018-08-05 MED ORDER — ONDANSETRON HCL 4 MG/2ML IJ SOLN
INTRAMUSCULAR | Status: DC | PRN
  Administered 2018-08-05: 4 mg via INTRAVENOUS

## 2018-08-05 MED ORDER — ROCURONIUM BROMIDE 50 MG/5ML IV SOSY
PREFILLED_SYRINGE | INTRAVENOUS | Status: AC
  Filled 2018-08-05: qty 5

## 2018-08-05 MED ORDER — SODIUM CHLORIDE 0.9 % IV SOLN
0.0125 ug/kg/min | INTRAVENOUS | Status: AC
  Administered 2018-08-05: .15 ug/kg/min via INTRAVENOUS
  Filled 2018-08-05: qty 2000

## 2018-08-05 MED ORDER — PROPOFOL 10 MG/ML IV BOLUS
INTRAVENOUS | Status: DC | PRN
  Administered 2018-08-05: 140 mg via INTRAVENOUS

## 2018-08-05 MED ORDER — PROTAMINE SULFATE 10 MG/ML IV SOLN
INTRAVENOUS | Status: DC | PRN
  Administered 2018-08-05 (×2): 50 mg via INTRAVENOUS

## 2018-08-05 MED ORDER — LACTATED RINGERS IV SOLN
INTRAVENOUS | Status: DC
  Administered 2018-08-05: 07:00:00 via INTRAVENOUS

## 2018-08-05 MED ORDER — LIDOCAINE HCL (PF) 1 % IJ SOLN
INTRAMUSCULAR | Status: AC
  Filled 2018-08-05: qty 30

## 2018-08-05 MED ORDER — FENTANYL CITRATE (PF) 100 MCG/2ML IJ SOLN
25.0000 ug | INTRAMUSCULAR | Status: DC | PRN
  Administered 2018-08-05: 25 ug via INTRAVENOUS

## 2018-08-05 MED ORDER — INSULIN ASPART 100 UNIT/ML ~~LOC~~ SOLN
0.0000 [IU] | Freq: Three times a day (TID) | SUBCUTANEOUS | Status: DC
  Administered 2018-08-05: 3 [IU] via SUBCUTANEOUS

## 2018-08-05 MED ORDER — SODIUM CHLORIDE 0.9 % IV SOLN: INTRAVENOUS | Status: DC

## 2018-08-05 MED ORDER — PANTOPRAZOLE SODIUM 40 MG PO TBEC
40.0000 mg | DELAYED_RELEASE_TABLET | Freq: Every day | ORAL | Status: DC
  Administered 2018-08-06 – 2018-08-10 (×4): 40 mg via ORAL
  Filled 2018-08-05 (×4): qty 1

## 2018-08-05 MED ORDER — FENTANYL CITRATE (PF) 250 MCG/5ML IJ SOLN
INTRAMUSCULAR | Status: AC
  Filled 2018-08-05: qty 5

## 2018-08-05 MED ORDER — BUDESONIDE 0.5 MG/2ML IN SUSP: 0.5000 mg | RESPIRATORY_TRACT | Status: DC | PRN

## 2018-08-05 MED ORDER — LACTATED RINGERS IV SOLN
INTRAVENOUS | Status: DC | PRN
  Administered 2018-08-05: 08:00:00 via INTRAVENOUS

## 2018-08-05 MED ORDER — EPHEDRINE 5 MG/ML INJ
INTRAVENOUS | Status: AC
  Filled 2018-08-05: qty 10

## 2018-08-05 SURGICAL SUPPLY — 54 items
CANISTER SUCT 3000ML PPV (MISCELLANEOUS) ×3 IMPLANT
CATH ROBINSON RED A/P 18FR (CATHETERS) IMPLANT
CLIP VESOCCLUDE MED 24/CT (CLIP) ×3 IMPLANT
CLIP VESOCCLUDE SM WIDE 24/CT (CLIP) ×3 IMPLANT
COVER PROBE W GEL 5X96 (DRAPES) IMPLANT
CRADLE DONUT ADULT HEAD (MISCELLANEOUS) ×3 IMPLANT
DERMABOND ADVANCED (GAUZE/BANDAGES/DRESSINGS) ×2
DERMABOND ADVANCED .7 DNX12 (GAUZE/BANDAGES/DRESSINGS) ×1 IMPLANT
DRAIN CHANNEL 15F RND FF W/TCR (WOUND CARE) IMPLANT
ELECT REM PT RETURN 9FT ADLT (ELECTROSURGICAL) ×3
ELECTRODE REM PT RTRN 9FT ADLT (ELECTROSURGICAL) ×1 IMPLANT
EVACUATOR SILICONE 100CC (DRAIN) IMPLANT
GLOVE BIO SURGEON STRL SZ7.5 (GLOVE) IMPLANT
GLOVE BIOGEL PI IND STRL 6.5 (GLOVE) ×1 IMPLANT
GLOVE BIOGEL PI IND STRL 7.0 (GLOVE) ×1 IMPLANT
GLOVE BIOGEL PI INDICATOR 6.5 (GLOVE) ×2
GLOVE BIOGEL PI INDICATOR 7.0 (GLOVE) ×2
GLOVE SURG SS PI 7.0 STRL IVOR (GLOVE) ×3 IMPLANT
GLOVE SURG SS PI 7.5 STRL IVOR (GLOVE) ×3 IMPLANT
GOWN STRL REUS W/ TWL LRG LVL3 (GOWN DISPOSABLE) ×2 IMPLANT
GOWN STRL REUS W/ TWL XL LVL3 (GOWN DISPOSABLE) ×1 IMPLANT
GOWN STRL REUS W/TWL LRG LVL3 (GOWN DISPOSABLE) ×4
GOWN STRL REUS W/TWL XL LVL3 (GOWN DISPOSABLE) ×2
HEMOSTAT SNOW SURGICEL 2X4 (HEMOSTASIS) ×3 IMPLANT
INSERT FOGARTY SM (MISCELLANEOUS) ×3 IMPLANT
IV ADAPTER SYR DOUBLE MALE LL (MISCELLANEOUS) IMPLANT
KIT BASIN OR (CUSTOM PROCEDURE TRAY) ×3 IMPLANT
KIT SHUNT ARGYLE CAROTID ART 6 (VASCULAR PRODUCTS) ×3 IMPLANT
KIT SUCTION CATH 14FR (SUCTIONS) ×3 IMPLANT
KIT TURNOVER KIT B (KITS) ×3 IMPLANT
NEEDLE HYPO 25GX1X1/2 BEV (NEEDLE) IMPLANT
NEEDLE SPNL 20GX3.5 QUINCKE YW (NEEDLE) IMPLANT
NS IRRIG 1000ML POUR BTL (IV SOLUTION) ×9 IMPLANT
PACK CAROTID (CUSTOM PROCEDURE TRAY) ×3 IMPLANT
PAD ARMBOARD 7.5X6 YLW CONV (MISCELLANEOUS) ×6 IMPLANT
PATCH VASC XENOSURE 1CMX6CM (Vascular Products) ×2 IMPLANT
PATCH VASC XENOSURE 1X6 (Vascular Products) ×1 IMPLANT
SHUNT CAROTID BYPASS 10 (VASCULAR PRODUCTS) IMPLANT
SHUNT CAROTID BYPASS 12FRX15.5 (VASCULAR PRODUCTS) IMPLANT
STOPCOCK 4 WAY LG BORE MALE ST (IV SETS) IMPLANT
SUT ETHILON 3 0 PS 1 (SUTURE) IMPLANT
SUT MNCRL AB 4-0 PS2 18 (SUTURE) ×3 IMPLANT
SUT PROLENE 6 0 BV (SUTURE) ×3 IMPLANT
SUT PROLENE BLUE 7 0 (SUTURE) ×3 IMPLANT
SUT SILK 3 0 (SUTURE)
SUT SILK 3-0 18XBRD TIE 12 (SUTURE) IMPLANT
SUT VIC AB 2-0 CT1 27 (SUTURE) ×2
SUT VIC AB 2-0 CT1 TAPERPNT 27 (SUTURE) ×1 IMPLANT
SUT VIC AB 3-0 SH 27 (SUTURE) ×2
SUT VIC AB 3-0 SH 27X BRD (SUTURE) ×1 IMPLANT
SYR CONTROL 10ML LL (SYRINGE) IMPLANT
TOWEL GREEN STERILE (TOWEL DISPOSABLE) ×3 IMPLANT
TUBING ART PRESS 48 MALE/FEM (TUBING) IMPLANT
WATER STERILE IRR 1000ML POUR (IV SOLUTION) ×3 IMPLANT

## 2018-08-05 NOTE — Anesthesia Procedure Notes (Signed)
Arterial Line Insertion Start/End8/28/2019 8:32 AM, 08/05/2018 8:37 AM Performed by: Duane Boston, MD, Sammie Bench, CRNA, CRNA  Patient location: OR. Preanesthetic checklist: patient identified, IV checked, site marked, risks and benefits discussed, surgical consent, monitors and equipment checked, pre-op evaluation and timeout performed Patient sedated radial was placed Catheter size: 20 G Hand hygiene performed  and maximum sterile barriers used  Allen's test indicative of satisfactory collateral circulation Attempts: 2 Procedure performed without using ultrasound guided technique. Ultrasound Notes:anatomy identified Following insertion, dressing applied and Biopatch. Post procedure assessment: normal  Patient tolerated the procedure well with no immediate complications.

## 2018-08-05 NOTE — Addendum Note (Signed)
Addendum  created 08/05/18 1246 by Cleda Daub, CRNA   Intraprocedure Meds edited

## 2018-08-05 NOTE — Anesthesia Procedure Notes (Signed)
Procedure Name: Intubation Date/Time: 08/05/2018 8:27 AM Performed by: Cleda Daub, CRNA Pre-anesthesia Checklist: Patient identified, Emergency Drugs available, Suction available and Patient being monitored Patient Re-evaluated:Patient Re-evaluated prior to induction Oxygen Delivery Method: Circle system utilized Preoxygenation: Pre-oxygenation with 100% oxygen Induction Type: IV induction Ventilation: Mask ventilation without difficulty and Mask ventilation throughout procedure Laryngoscope Size: Mac and 3 Grade View: Grade I Tube type: Oral Tube size: 7.0 mm Number of attempts: 1 Airway Equipment and Method: Stylet Placement Confirmation: ETT inserted through vocal cords under direct vision,  positive ETCO2 and breath sounds checked- equal and bilateral Secured at: 23 cm Tube secured with: Tape Dental Injury: Teeth and Oropharynx as per pre-operative assessment

## 2018-08-05 NOTE — Anesthesia Postprocedure Evaluation (Signed)
Anesthesia Post Note  Patient: Bailey [name]  Procedure(s) Performed: ENDARTERECTOMY CAROTID LEFT (Left Neck) PATCH ANGIOPLASTY USING XENOSURE BIOLOGIC PATCH 1cm x 6cm (Left Neck)     Patient location during evaluation: PACU Anesthesia Type: General Level of consciousness: sedated Pain management: pain level controlled Vital Signs Assessment: post-procedure vital signs reviewed and stable Respiratory status: spontaneous breathing and respiratory function stable Cardiovascular status: stable Postop Assessment: no apparent nausea or vomiting Anesthetic complications: no    Last Vitals:  Vitals:   08/05/18 1120 08/05/18 1145  BP: (!) 66/48 112/67  Pulse: 78 77  Resp: 12 12  Temp:    SpO2: 99% 100%    Last Pain:  Vitals:   08/05/18 0701  TempSrc:   PainSc: 0-No pain                 Caliph Borowiak DANIEL

## 2018-08-05 NOTE — Telephone Encounter (Signed)
sch appt lvm 09/04/18 9am f/u MD

## 2018-08-05 NOTE — Transfer of Care (Signed)
Immediate Anesthesia Transfer of Care Note  Patient: Bailey [name]  Procedure(s) Performed: ENDARTERECTOMY CAROTID LEFT (Left Neck) PATCH ANGIOPLASTY USING XENOSURE BIOLOGIC PATCH 1cm x 6cm (Left Neck)  Patient Location: PACU  Anesthesia Type:General  Level of Consciousness: awake, alert , oriented and patient cooperative  Airway & Oxygen Therapy: Patient Spontanous Breathing and Patient connected to face mask oxygen  Post-op Assessment: Report given to RN and Post -op Vital signs reviewed and stable  Post vital signs: Reviewed and stable  Last Vitals:  Vitals Value Taken Time  BP 86/64 08/05/2018 10:40 AM  Temp    Pulse 77 08/05/2018 10:41 AM  Resp 9 08/05/2018 10:41 AM  SpO2 99 % 08/05/2018 10:41 AM  Vitals shown include unvalidated device data.  Last Pain:  Vitals:   08/05/18 0701  TempSrc:   PainSc: 0-No pain      Patients Stated Pain Goal: 5 (22/33/61 2244)  Complications: No apparent anesthesia complications

## 2018-08-05 NOTE — Discharge Instructions (Signed)
° °  [facility] [place] ° °Discharge Instructions °  °Carotid Endarterectomy (CEA) ° °Please refer to the following instructions for your post-procedure care. Your surgeon or physician assistant will discuss any changes with you. ° °Activity ° °You are encouraged to walk as much as you can. You can slowly return to normal activities but must avoid strenuous activity and heavy lifting until your doctor tell you it's okay. Avoid activities such as vacuuming or swinging a golf club. You can drive after one week if you are comfortable and you are no longer taking prescription pain medications. It is normal to feel tired for serval weeks after your surgery. It is also normal to have difficulty with sleep habits, eating, and bowel movements after surgery. These will go away with time. ° °Bathing/Showering ° °Shower daily after you go home. Do not soak in a bathtub, hot tub, or swim until the incision heals completely. ° °Incision Care ° °Shower every day. Clean your incision with mild soap and water. Pat the area dry with a clean towel. You do not need a bandage unless otherwise instructed. Do not apply any ointments or creams to your incision. You may have skin glue on your incision. Do not peel it off. It will come off on its own in about one week. Your incision may feel thickened and raised for several weeks after your surgery. This is normal and the skin will soften over time.  ° °For Men Only: It's okay to shave around the incision but do not shave the incision itself for 2 weeks. It is common to have numbness under your chin that could last for several months. ° °Diet ° °Resume your normal diet. There are no special food restrictions following this procedure. A low fat/low cholesterol diet is recommended for all patients with vascular disease. In order to heal from your surgery, it is CRITICAL to get adequate nutrition. Your body requires vitamins, minerals, and protein. Vegetables are the  best source of vitamins and minerals. Vegetables also provide the perfect balance of protein. Processed food has little nutritional value, so try to avoid this. ° °Medications ° °Resume taking all of your medications unless your doctor or physician assistant tells you not to. If your incision is causing pain, you may take over-the- counter pain relievers such as acetaminophen (Tylenol). If you were prescribed a stronger pain medication, please be aware these medications can cause nausea and constipation. Prevent nausea by taking the medication with a snack or meal. Avoid constipation by drinking plenty of fluids and eating foods with a high amount of fiber, such as fruits, vegetables, and grains.  °Do not take Tylenol if you are taking prescription pain medications. ° °Follow Up ° °Our office will schedule a follow up appointment 2-3 weeks following discharge. ° °Please call us immediately for any of the following conditions ° °Increased pain, redness, drainage (pus) from your incision site. °Fever of 101 degrees or higher. °If you should develop stroke (slurred speech, difficulty swallowing, weakness on one side of your body, loss of vision) you should call 911 and go to the nearest emergency room. ° °Reduce your risk of vascular disease: ° °Stop smoking. If you would like help call [facility] at 1-800-QUIT-NOW (1-800-784-8669) or [place] at 336-586-4000. °Manage your cholesterol °Maintain a desired weight °Control your diabetes °Keep your blood pressure down ° °If you have any questions, please call the office at [phone]. ° °

## 2018-08-05 NOTE — Op Note (Signed)
Patient name: Bailey [name] MRN: [id number] DOB: March 04, 1946 Sex: female  08/05/2018 Pre-operative Diagnosis: Symptomatic left internal carotid artery stenosis Post-operative diagnosis:  Same Surgeon:  Erlene Quan C. Donzetta Matters, MD Assistant: Leontine Locket, PA Procedure Performed: Left carotid endarterectomy with bovine pericardial patch angioplasty  Indications: 72 year old female who is been followed with left carotid stenosis.  She has had some left eye symptoms dating back several months and was found to have increase in her flow velocities up to 80% with nearly 60% blockage by CT angio.  She is therefore indicated for carotid endarterectomy on the left.  Findings: There is a very focal napkin ring type plaque at the takeoff of the internal carotid artery on the left.  At completion there were expected signals in the internal carotid artery with flow throughout diastole.   Procedure:  The patient was identified in the holding area and taken to the operating room where she is placed supine on the operative table and general anesthesia was induced.  She was sterilely prepped and draped in the left neck chest in usual fashion given antibiotics and timeout was called.  We began with a longitudinal incision along the anterior border the sternocleidomastoid divided down through the skin subcutaneous tissue and platysma.  We identified the internal jugular vein.  There were multiple venous branches that were divided between clips and ties.  We identified the common carotid artery placed an umbilical tape around this and 8000 units of heparin was administered and ACT returned to 60.  We then divided up onto the external carotid artery placed a vessel loop around this and isolated our superior thyroidal branch with a silk tie.  We dissected up onto our internal carotid artery.  We did identified our hypoglossal nerve protected this and did divide our ansa cervicalis between clips.  We placed a vessel loop around  the internal carotid artery and had a blood pressure of approximately 140.  We prepared a 10 [place] shunt.  We then clamped our internal followed by common followed by external carotid arteries and open to the artery longitudinally.  The shunt was placed distally into the internal allowed to backbleed and then placed into the common and flow was confirmed with Doppler.  We then proceeded with endarterectomy of the very focal lesion up onto the internal carotid artery.  There is very minimal external carotid blockage and there was good backbleeding.  We did use 7-0 Prolene to tack our distal endpoint and 2 places.  We then prepared a bovine pericardial patch and sewed this in place with 6-0 Prolene suture.  Prior to completion anastomosis we removed our shunt and then allowed the antegrade backbleeding and flushed with heparinized saline.  On completion we then allowed backbleeding from the internal carotid reclamped this.  We opened our external followed by the common.  After a few cardiac cycles we open her internal carotid artery.  Doppler confirmed flow throughout diastole with no evidence of high velocities.  Satisfied with this we initially gave 50 mg of protamine and then an additional 50.  We obtained hemostasis irrigated the wound closed in layers with Vicryl at the platysma and Monocryl at the level of the skin.  Dermabond was placed to level the skin.  She was allowed away from anesthesia was noted to be moving all of her extremities and following commands was transferred to the PACU in stable condition.  Next  EBL 100 cc.    Brandon C. Donzetta Matters, MD [facility] [place] Office: [phone] Pager: [phone]

## 2018-08-05 NOTE — H&P (Signed)
   History and Physical Update  The patient was interviewed and re-examined.  The patient's previous History and Physical has been reviewed and is unchanged from recent office visit. CTA reviewed. Proceed with left CEA with concern for symptomatic carotid lesion. Again discussed the risks and benefits and she agrees to proceed.   Bailey [name] C. Donzetta Matters, MD [facility] [place] Office: [phone] Pager: [phone]  08/05/2018, 8:15 AM

## 2018-08-05 NOTE — Progress Notes (Signed)
  Day of Surgery Note    Subjective:  Wants her feet uncovered and socks off.  Says she feels a little funny.   Vitals:   08/05/18 0617 08/05/18 1045  BP: 138/75   Pulse: 85   Resp: 18   Temp: 98.4 F (36.9 C) (!) 97 F (36.1 C)  SpO2: 97%     Incisions:   Clean and dry with mild swelling mid proximal incision Extremities:  Moving all extremities equally Cardiac:  regular Lungs:  Non labored Neuro:  In tact; tongue is midline   Assessment/Plan:  This is a 72 y.o. female who is s/p  Left carotid endarterectomy   -pt is neuro in tact however she is hypotensive in the pacu. HR is 70's and NSR.  She is alert and oriented.  She is receiving a fluid bolus and albumin.  Discussed with Dr. Donzetta Matters and will start Dopamine, get and EKG and plan to go [facility]ICU.    Leontine Locket, PA-C 08/05/2018 11:10 AM [phone]

## 2018-08-06 ENCOUNTER — Other Ambulatory Visit: Payer: Self-pay

## 2018-08-06 ENCOUNTER — Encounter (HOSPITAL_COMMUNITY): Payer: Self-pay

## 2018-08-06 LAB — BASIC METABOLIC PANEL
Anion gap: 6 (ref 5–15)
BUN: 9 mg/dL (ref 8–23)
CALCIUM: 8.9 mg/dL (ref 8.9–10.3)
CO2: 26 mmol/L (ref 22–32)
CREATININE: 0.52 mg/dL (ref 0.44–1.00)
Chloride: 108 mmol/L (ref 98–111)
GFR calc non Af Amer: >60 mL/min (ref >60)
Glucose, Bld: 127 mg/dL — ABNORMAL HIGH (ref 70–99)
Potassium: 4.2 mmol/L (ref 3.5–5.1)
SODIUM: 140 mmol/L (ref 135–145)

## 2018-08-06 LAB — GLUCOSE, CAPILLARY
GLUCOSE-CAPILLARY: 118 mg/dL — AB (ref 70–99)
Glucose-Capillary: 115 mg/dL — ABNORMAL HIGH (ref 70–99)
Glucose-Capillary: 108 mg/dL — ABNORMAL HIGH (ref 70–99)
Glucose-Capillary: 110 mg/dL — ABNORMAL HIGH (ref 70–99)

## 2018-08-06 LAB — CBC
HCT: 32.7 % — ABNORMAL LOW (ref 36.0–46.0)
Hemoglobin: 10.6 g/dL — ABNORMAL LOW (ref 12.0–15.0)
MCH: 29.1 pg (ref 26.0–34.0)
MCHC: 32.4 g/dL (ref 30.0–36.0)
MCV: 89.8 fL (ref 78.0–100.0)
Platelets: 177 10*3/uL (ref 150–400)
RBC: 3.64 MIL/uL — ABNORMAL LOW (ref 3.87–5.11)
RDW: 14 % (ref 11.5–15.5)
WBC: 9.7 10*3/uL (ref 4.0–10.5)

## 2018-08-06 MED ORDER — TRAMADOL HCL 50 MG PO TABS
50.0000 mg | ORAL_TABLET | Freq: Four times a day (QID) | ORAL | Status: DC
  Administered 2018-08-06 – 2018-08-09 (×12): 50 mg via ORAL
  Filled 2018-08-06 (×12): qty 1

## 2018-08-06 NOTE — Progress Notes (Addendum)
Progress Note    08/06/2018 7:17 AM 1 Day Post-Op  Subjective:  Says she doesn't feel as good as she expected.  Had nausea overnight.  Morphine made her terribly sick.  RN reports Zofran made BP labile.  Nurses helped her go to the bathroom and she jerked her neck.    Afebrile HR 50's-80's SB/NSR 23'N-361'W systolic 431% 5QM0QQ  Gtts:  Dopamine 81mcg/kg/min (increased this am)   Vitals:   08/06/18 0500 08/06/18 0600  BP:  (!) 99/55  Pulse: 61 (!) 58  Resp: 10 12  Temp:    SpO2: 100% 100%     Physical Exam: Neuro:  In tact; moving all extremities equally; tongue is midline Lungs:  Non labored Incision:  Clean and dry with some ecchymosis around incision.  Minimal fullness.   CBC    Component Value Date/Time   WBC 9.7 08/06/2018 0403   RBC 3.64 (L) 08/06/2018 0403   HGB 10.6 (L) 08/06/2018 0403   HGB 13.0 12/24/2014 2021   HCT 32.7 (L) 08/06/2018 0403   HCT 40.1 12/24/2014 2021   PLT 177 08/06/2018 0403   PLT 176 12/24/2014 2021   MCV 89.8 08/06/2018 0403   MCV 87 12/24/2014 2021   MCH 29.1 08/06/2018 0403   MCHC 32.4 08/06/2018 0403   RDW 14.0 08/06/2018 0403   RDW 14.5 12/24/2014 2021   LYMPHSABS 1,547 07/07/2018 1121   LYMPHSABS 2.5 12/24/2014 2021   MONOABS 0.9 07/10/2017 0552   MONOABS 1.1 (H) 12/24/2014 2021   EOSABS 89 07/07/2018 1121   EOSABS 0.2 12/24/2014 2021   BASOSABS 37 07/07/2018 1121   BASOSABS 0.1 12/24/2014 2021    BMET    Component Value Date/Time   NA 140 08/06/2018 0403   NA 142 08/24/2015 1023   NA 138 12/24/2014 2021   K 4.2 08/06/2018 0403   K 4.0 12/24/2014 2021   CL 108 08/06/2018 0403   CL 100 12/24/2014 2021   CO2 26 08/06/2018 0403   CO2 30 12/24/2014 2021   GLUCOSE 127 (H) 08/06/2018 0403   GLUCOSE 93 12/24/2014 2021   BUN 9 08/06/2018 0403   BUN 16 08/24/2015 1023   BUN 21 (H) 12/24/2014 2021   CREATININE 0.52 08/06/2018 0403   CREATININE 0.85 07/07/2018 1121   CALCIUM 8.9 08/06/2018 0403   CALCIUM 8.8  12/24/2014 2021   GFRNONAA >60 08/06/2018 0403   GFRNONAA 69 07/07/2018 1121   GFRAA >60 08/06/2018 0403   GFRAA 80 07/07/2018 1121     Intake/Output Summary (Last 24 hours) at 08/06/2018 0717 Last data filed at 08/06/2018 0600 Gross per 24 hour  Intake 3599.82 ml  Output 1070 ml  Net 2529.82 ml     Assessment/Plan:  This is a 72 y.o. female who is s/p left CEA 1 Day Post-Op   -pt neuro exam is in tact;  -she continues to require dopamine for blood pressure support.  Will wean as blood pressure permits.  She does have acute blood loss anemia but hgb is 10.6 (down from 13)-EF is 55-60%.   Will hold on transfusion for now. -nausea-Zofran did not help and per RN, made BP labile.  Will try low dose of phenergan.   Morphine also made her very nauseated-she is allergic to dilaudid and NSAIDS.   Tramadol did help with her pain but wore off.  If IV pain med needed, may benefit from Fentanyl.  Will d/w Dr. Donzetta Matters. -pt has not ambulated -Dr. Donzetta Matters to see pt this morning.  Leontine Locket, PA-C [facility] [phone]   I have independently interviewed and examined patient and agree with PA assessment and plan above.  Continue to wean dopamine as tolerated.  She can have diet as tolerated.  We will continue ICU care while continuing vasopressors.  Hopefully can wean off and will be expected to go home as she is neurologically intact.  Virgle Arth C. Donzetta Matters, MD [facility] [place] Office: [phone] Pager: [phone]

## 2018-08-06 NOTE — Progress Notes (Signed)
Pt c/o left arm and shoulder pain, 12 lead performed. Sinus brady 50's. Otherwise asymptomatic. MAP >60. Dr Carlis Abbott paged and made aware. No new orders at this time

## 2018-08-07 LAB — GLUCOSE, CAPILLARY
GLUCOSE-CAPILLARY: 82 mg/dL (ref 70–99)
Glucose-Capillary: 110 mg/dL — ABNORMAL HIGH (ref 70–99)
Glucose-Capillary: 99 mg/dL (ref 70–99)
Glucose-Capillary: 75 mg/dL (ref 70–99)
Glucose-Capillary: 67 mg/dL — ABNORMAL LOW (ref 70–99)
Glucose-Capillary: 114 mg/dL — ABNORMAL HIGH (ref 70–99)

## 2018-08-07 LAB — CBC
HCT: 31.3 % — ABNORMAL LOW (ref 36.0–46.0)
HEMOGLOBIN: 9.9 g/dL — AB (ref 12.0–15.0)
MCH: 29 pg (ref 26.0–34.0)
MCHC: 31.6 g/dL (ref 30.0–36.0)
MCV: 91.8 fL (ref 78.0–100.0)
PLATELETS: 150 10*3/uL (ref 150–400)
RBC: 3.41 MIL/uL — ABNORMAL LOW (ref 3.87–5.11)
RDW: 13.9 % (ref 11.5–15.5)
WBC: 7.6 10*3/uL (ref 4.0–10.5)

## 2018-08-07 LAB — CREATININE, SERUM
CREATININE: 0.58 mg/dL (ref 0.44–1.00)
GFR calc Af Amer: >60 mL/min (ref >60)
GFR calc non Af Amer: >60 mL/min (ref >60)

## 2018-08-07 MED ORDER — HEPARIN SODIUM (PORCINE) 5000 UNIT/ML IJ SOLN
5000.0000 [IU] | Freq: Three times a day (TID) | INTRAMUSCULAR | Status: DC
  Administered 2018-08-07 – 2018-08-10 (×8): 5000 [IU] via SUBCUTANEOUS
  Filled 2018-08-07 (×9): qty 1

## 2018-08-07 MED ORDER — PSEUDOEPHEDRINE HCL 60 MG PO TABS
60.0000 mg | ORAL_TABLET | Freq: Four times a day (QID) | ORAL | Status: DC | PRN
  Administered 2018-08-07: 60 mg via ORAL
  Filled 2018-08-07 (×2): qty 1

## 2018-08-07 NOTE — Progress Notes (Signed)
Hypoglycemic Event  CBG: 67  Treatment: orange juice   Symptoms: none   Follow-up CBG: Time: 1830 CBG Result: 82  Possible Reasons for Event: decreased appetite   Pt not complaining of any symptoms.  Continues to sip on orange juice. Will continue to monitor.       Doyle Tegethoff P Olan Kurek

## 2018-08-07 NOTE — Progress Notes (Signed)
  Progress Note    08/07/2018 8:32 AM 2 Days Post-Op  Subjective: No real complaints this morning except headaches  Vitals:   08/07/18 0700 08/07/18 0733  BP: 92/60   Pulse: 66   Resp: 15   Temp:  98.1 F (36.7 C)  SpO2: 98%     Physical Exam: Awake alert oriented Left neck with stable peri-incisional ecchymosis Nonlabored respirations Moving all extremities well without sensory or motor deficits  CBC    Component Value Date/Time   WBC 9.7 08/06/2018 0403   RBC 3.64 (L) 08/06/2018 0403   HGB 10.6 (L) 08/06/2018 0403   HGB 13.0 12/24/2014 2021   HCT 32.7 (L) 08/06/2018 0403   HCT 40.1 12/24/2014 2021   PLT 177 08/06/2018 0403   PLT 176 12/24/2014 2021   MCV 89.8 08/06/2018 0403   MCV 87 12/24/2014 2021   MCH 29.1 08/06/2018 0403   MCHC 32.4 08/06/2018 0403   RDW 14.0 08/06/2018 0403   RDW 14.5 12/24/2014 2021   LYMPHSABS 1,547 07/07/2018 1121   LYMPHSABS 2.5 12/24/2014 2021   MONOABS 0.9 07/10/2017 0552   MONOABS 1.1 (H) 12/24/2014 2021   EOSABS 89 07/07/2018 1121   EOSABS 0.2 12/24/2014 2021   BASOSABS 37 07/07/2018 1121   BASOSABS 0.1 12/24/2014 2021    BMET    Component Value Date/Time   NA 140 08/06/2018 0403   NA 142 08/24/2015 1023   NA 138 12/24/2014 2021   K 4.2 08/06/2018 0403   K 4.0 12/24/2014 2021   CL 108 08/06/2018 0403   CL 100 12/24/2014 2021   CO2 26 08/06/2018 0403   CO2 30 12/24/2014 2021   GLUCOSE 127 (H) 08/06/2018 0403   GLUCOSE 93 12/24/2014 2021   BUN 9 08/06/2018 0403   BUN 16 08/24/2015 1023   BUN 21 (H) 12/24/2014 2021   CREATININE 0.52 08/06/2018 0403   CREATININE 0.85 07/07/2018 1121   CALCIUM 8.9 08/06/2018 0403   CALCIUM 8.8 12/24/2014 2021   GFRNONAA >60 08/06/2018 0403   GFRNONAA 69 07/07/2018 1121   GFRAA >60 08/06/2018 0403   GFRAA 80 07/07/2018 1121    INR    Component Value Date/Time   INR 0.96 08/04/2018 0855     Intake/Output Summary (Last 24 hours) at 08/07/2018 4650 Last data filed at  08/07/2018 0600 Gross per 24 hour  Intake 2109.87 ml  Output 2300 ml  Net -190.13 ml     Assessment:  72 y.o. female is s/p left carotid endarterectomy for questionably symptomatic disease  Plan: Wean dopamine as tolerated Continue aspirin and plavix Start pseudoephedrine for blood pressure management has EKG has been unremarkable Subcu heparin for DVT prophylaxis.   Shamyah Stantz C. Donzetta Matters, MD [facility] Office: [phone] Pager: [phone]  08/07/2018 8:32 AM

## 2018-08-07 NOTE — Plan of Care (Signed)

## 2018-08-08 LAB — GLUCOSE, CAPILLARY
GLUCOSE-CAPILLARY: 76 mg/dL (ref 70–99)
GLUCOSE-CAPILLARY: 77 mg/dL (ref 70–99)
GLUCOSE-CAPILLARY: 106 mg/dL — AB (ref 70–99)
Glucose-Capillary: 67 mg/dL — ABNORMAL LOW (ref 70–99)

## 2018-08-08 NOTE — Plan of Care (Deleted)
  Problem: Clinical Measurements: Goal: Cardiovascular complication will be avoided Outcome: Progressing   Problem: Nutrition: Goal: Adequate nutrition will be maintained Outcome: Progressing   Problem: Safety: Goal: Ability to remain free from injury will improve Outcome: Progressing   Problem: Education: Goal: Knowledge of General Education information will improve Description Including pain rating scale, medication(s)/side effects and non-pharmacologic comfort measures Outcome: Not Progressing   Problem: Health Behavior/Discharge Planning: Goal: Ability to manage health-related needs will improve Outcome: Not Progressing   Problem: Clinical Measurements: Goal: Ability to maintain clinical measurements within normal limits will improve Outcome: Not Progressing Goal: Will remain free from infection Outcome: Not Progressing Goal: Diagnostic test results will improve Outcome: Not Progressing Goal: Respiratory complications will improve Outcome: Not Progressing   Problem: Activity: Goal: Risk for activity intolerance will decrease Outcome: Not Progressing   Problem: Coping: Goal: Level of anxiety will decrease Outcome: Not Progressing   Problem: Elimination: Goal: Will not experience complications related to bowel motility Outcome: Not Progressing Goal: Will not experience complications related to urinary retention Outcome: Not Progressing   Problem: Pain Managment: Goal: General experience of comfort will improve Outcome: Not Progressing   Problem: Skin Integrity: Goal: Risk for impaired skin integrity will decrease Outcome: Not Progressing

## 2018-08-08 NOTE — Progress Notes (Signed)
[facility]  Subjective  - feels ok feels like she has to work hard to swallow but no gagging or choking   Objective (!) 93/49 61 98.4 F (36.9 C) (Oral) 13 96%  Intake/Output Summary (Last 24 hours) at 08/08/2018 1001 Last data filed at 08/08/2018 0500 Gross per 24 hour  Intake 962.11 ml  Output 350 ml  Net 612.11 ml   Left neck incision healing Neuro UE LE 5/5 motor apparently a little unsteady when walking No facial asymmetry tongue midline  Assessment/Planning: POD #3 left CEA Off dopamine almost 24 hours BP 100s Transfer to telemetry if BP reasonable today D/c home tomorrow  Ruta Hinds 08/08/2018 10:01 AM --  Laboratory Lab Results: Recent Labs    08/06/18 0403 08/07/18 0843  WBC 9.7 7.6  HGB 10.6* 9.9*  HCT 32.7* 31.3*  PLT 177 150   BMET Recent Labs    08/06/18 0403 08/07/18 0843  NA 140  --   K 4.2  --   CL 108  --   CO2 26  --   GLUCOSE 127*  --   BUN 9  --   CREATININE 0.52 0.58  CALCIUM 8.9  --     COAG Lab Results  Component Value Date   INR 0.96 08/04/2018   INR 1.04 04/01/2014   No results found for: PTT

## 2018-08-08 NOTE — Plan of Care (Signed)
  Problem: Education: Goal: Knowledge of General Education information will improve Description Including pain rating scale, medication(s)/side effects and non-pharmacologic comfort measures 08/08/2018 0559 by Soundra Pilon, RN Outcome: Progressing 08/08/2018 0558 by Soundra Pilon, RN Outcome: Not Progressing   Problem: Health Behavior/Discharge Planning: Goal: Ability to manage health-related needs will improve 08/08/2018 0559 by Soundra Pilon, RN Outcome: Progressing 08/08/2018 0558 by Soundra Pilon, RN Outcome: Not Progressing   Problem: Clinical Measurements: Goal: Ability to maintain clinical measurements within normal limits will improve 08/08/2018 0559 by Soundra Pilon, RN Outcome: Progressing 08/08/2018 0558 by Soundra Pilon, RN Outcome: Not Progressing Goal: Will remain free from infection 08/08/2018 0559 by Soundra Pilon, RN Outcome: Progressing 08/08/2018 0558 by Soundra Pilon, RN Outcome: Not Progressing Goal: Diagnostic test results will improve 08/08/2018 0559 by Soundra Pilon, RN Outcome: Progressing 08/08/2018 0558 by Soundra Pilon, RN Outcome: Not Progressing Goal: Respiratory complications will improve 08/08/2018 0559 by Soundra Pilon, RN Outcome: Progressing 08/08/2018 0558 by Soundra Pilon, RN Outcome: Not Progressing Goal: Cardiovascular complication will be avoided 08/08/2018 0559 by Soundra Pilon, RN Outcome: Progressing 08/08/2018 0558 by Soundra Pilon, RN Outcome: Progressing   Problem: Activity: Goal: Risk for activity intolerance will decrease 08/08/2018 0559 by Soundra Pilon, RN Outcome: Progressing 08/08/2018 0558 by Soundra Pilon, RN Outcome: Not Progressing   Problem: Nutrition: Goal: Adequate nutrition will be maintained 08/08/2018 0559 by Soundra Pilon, RN Outcome: Progressing 08/08/2018 0558 by Soundra Pilon, RN Outcome: Progressing   Problem: Coping: Goal: Level of anxiety will decrease 08/08/2018 0559 by  Soundra Pilon, RN Outcome: Progressing 08/08/2018 0558 by Soundra Pilon, RN Outcome: Not Progressing   Problem: Elimination: Goal: Will not experience complications related to bowel motility 08/08/2018 0559 by Soundra Pilon, RN Outcome: Progressing 08/08/2018 0558 by Soundra Pilon, RN Outcome: Not Progressing Goal: Will not experience complications related to urinary retention 08/08/2018 0559 by Soundra Pilon, RN Outcome: Progressing 08/08/2018 0558 by Soundra Pilon, RN Outcome: Not Progressing   Problem: Pain Managment: Goal: General experience of comfort will improve 08/08/2018 0559 by Soundra Pilon, RN Outcome: Progressing 08/08/2018 0558 by Soundra Pilon, RN Outcome: Not Progressing   Problem: Safety: Goal: Ability to remain free from injury will improve 08/08/2018 0559 by Soundra Pilon, RN Outcome: Progressing 08/08/2018 0558 by Soundra Pilon, RN Outcome: Progressing   Problem: Skin Integrity: Goal: Risk for impaired skin integrity will decrease 08/08/2018 0559 by Soundra Pilon, RN Outcome: Progressing 08/08/2018 0558 by Soundra Pilon, RN Outcome: Not Progressing

## 2018-08-09 LAB — GLUCOSE, CAPILLARY
GLUCOSE-CAPILLARY: 77 mg/dL (ref 70–99)
GLUCOSE-CAPILLARY: 77 mg/dL (ref 70–99)
GLUCOSE-CAPILLARY: 81 mg/dL (ref 70–99)
Glucose-Capillary: 111 mg/dL — ABNORMAL HIGH (ref 70–99)

## 2018-08-09 MED ORDER — ACETAMINOPHEN 325 MG PO TABS
650.0000 mg | ORAL_TABLET | ORAL | Status: DC | PRN
  Administered 2018-08-09 – 2018-08-10 (×2): 650 mg via ORAL
  Filled 2018-08-09 (×2): qty 2

## 2018-08-09 NOTE — Progress Notes (Signed)
08/09/2018 1830 Received transfer into room 4E-17 from [place].  Pt is A&O, with C/O some pain in surgical site rating 4/10.  Tele monitor applied and CCMD notified.  CHG bath given.  Oriented to room, call light and bed.  Call bell in reach, family at bedside. [place]

## 2018-08-09 NOTE — Plan of Care (Signed)

## 2018-08-09 NOTE — Progress Notes (Signed)
[facility]  Subjective  - still feels unsteady walking   Objective (!) 89/74 (!) 57 97.9 F (36.6 C) (Oral) 17 100%  Intake/Output Summary (Last 24 hours) at 08/09/2018 0757 Last data filed at 08/09/2018 0500 Gross per 24 hour  Intake 840 ml  Output 1450 ml  Net -610 ml   Left neck incision healing   Assessment/Planning: Off dopamine for almost 48 hr  Headaches improving.  Complains of not being able to remember events in hospital.  Told her this is most likely disruption of sleep wake and should improve  Still slow swallowing but no aspiration symptoms should improve 4-6 weeks  Slowly recovering wants one more day in hospital since lives alone and a little unsteady on her feet   Transfer 4e continue to monitor BP  Ruta Hinds 08/09/2018 7:57 AM --  Laboratory Lab Results: Recent Labs    08/07/18 0843  WBC 7.6  HGB 9.9*  HCT 31.3*  PLT 150   BMET Recent Labs    08/07/18 0843  CREATININE 0.58    COAG Lab Results  Component Value Date   INR 0.96 08/04/2018   INR 1.04 04/01/2014   No results found for: PTT

## 2018-08-10 LAB — GLUCOSE, CAPILLARY
Glucose-Capillary: 110 mg/dL — ABNORMAL HIGH (ref 70–99)
Glucose-Capillary: 112 mg/dL — ABNORMAL HIGH (ref 70–99)

## 2018-08-10 NOTE — Progress Notes (Addendum)
[facility]  Subjective  - She thinks she is ready to go home today.   Objective 121/65 68 98 F (36.7 C) (Oral) 14 95% No intake or output data in the 24 hours ending 08/10/18 0749  Left neck incision healing well No tongue deviation and smile is symmetric Heart RRR Lungs non labored breathing  Assessment/Planning: S/P left CEA  Ambulating with rolling walker in room independently this am Plan discharged home today in stable condition F/U with Dr. Donzetta Matters in 2-3 weeks She has good pain control with tylenol and does not want percocet.    Roxy Horseman 08/10/2018 7:49 AM -- Medically ready for d/c  Offered rehab but declined D/c home  Ruta Hinds, MD [facility] [place]: [phone] Pager: [phone]  Laboratory Lab Results: Recent Labs    08/07/18 0843  WBC 7.6  HGB 9.9*  HCT 31.3*  PLT 150   BMET Recent Labs    08/07/18 0843  CREATININE 0.58    COAG Lab Results  Component Value Date   INR 0.96 08/04/2018   INR 1.04 04/01/2014   No results found for: PTT

## 2018-08-10 NOTE — Progress Notes (Signed)
Pt given discharge instructions and educated. Vitals stable.  Pt IV removed and intact. Pt has all belongings including glasses and cell phone. Pt tx to valet parking via wheelchair to meet ride. Jerald Kief, RN

## 2018-08-12 ENCOUNTER — Emergency Department (HOSPITAL_COMMUNITY): Payer: Medicare Other

## 2018-08-12 ENCOUNTER — Ambulatory Visit (HOSPITAL_COMMUNITY)
Admission: EM | Admit: 2018-08-12 | Discharge: 2018-08-12 | Disposition: A | Discharge status: Home / Self Care | Payer: Medicare Other | Source: Home / Self Care | Attending: Emergency Medicine | Admitting: Emergency Medicine

## 2018-08-12 ENCOUNTER — Telehealth: Payer: Self-pay | Admitting: Family Medicine

## 2018-08-12 ENCOUNTER — Encounter (HOSPITAL_COMMUNITY): Payer: Self-pay | Admitting: Emergency Medicine

## 2018-08-12 ENCOUNTER — Emergency Department (HOSPITAL_COMMUNITY)
Admission: EM | Admit: 2018-08-12 | Discharge: 2018-08-13 | Disposition: A | Discharge status: Home / Self Care | Payer: Medicare Other | Attending: Emergency Medicine | Admitting: Emergency Medicine

## 2018-08-12 ENCOUNTER — Other Ambulatory Visit: Payer: Self-pay

## 2018-08-12 DIAGNOSIS — Z7902 Long term (current) use of antithrombotics/antiplatelets: Secondary | ICD-10-CM | POA: Diagnosis not present

## 2018-08-12 DIAGNOSIS — I10 Essential (primary) hypertension: Secondary | ICD-10-CM | POA: Diagnosis not present

## 2018-08-12 DIAGNOSIS — Z7984 Long term (current) use of oral hypoglycemic drugs: Secondary | ICD-10-CM | POA: Insufficient documentation

## 2018-08-12 DIAGNOSIS — Z79899 Other long term (current) drug therapy: Secondary | ICD-10-CM | POA: Diagnosis not present

## 2018-08-12 DIAGNOSIS — R002 Palpitations: Secondary | ICD-10-CM | POA: Insufficient documentation

## 2018-08-12 DIAGNOSIS — E119 Type 2 diabetes mellitus without complications: Secondary | ICD-10-CM | POA: Diagnosis not present

## 2018-08-12 DIAGNOSIS — E039 Hypothyroidism, unspecified: Secondary | ICD-10-CM | POA: Insufficient documentation

## 2018-08-12 DIAGNOSIS — Z9889 Other specified postprocedural states: Secondary | ICD-10-CM | POA: Diagnosis not present

## 2018-08-12 DIAGNOSIS — K5641 Fecal impaction: Secondary | ICD-10-CM | POA: Diagnosis not present

## 2018-08-12 DIAGNOSIS — Z91040 Latex allergy status: Secondary | ICD-10-CM | POA: Diagnosis not present

## 2018-08-12 DIAGNOSIS — Z7982 Long term (current) use of aspirin: Secondary | ICD-10-CM | POA: Insufficient documentation

## 2018-08-12 DIAGNOSIS — K5900 Constipation, unspecified: Secondary | ICD-10-CM | POA: Diagnosis not present

## 2018-08-12 DIAGNOSIS — J45909 Unspecified asthma, uncomplicated: Secondary | ICD-10-CM | POA: Insufficient documentation

## 2018-08-12 LAB — BASIC METABOLIC PANEL WITH GFR
Anion gap: 12 (ref 5–15)
BUN: 10 mg/dL (ref 8–23)
CO2: 25 mmol/L (ref 22–32)
Calcium: 9.6 mg/dL (ref 8.9–10.3)
Chloride: 107 mmol/L (ref 98–111)
Creatinine, Ser: 0.64 mg/dL (ref 0.44–1.00)
GFR calc Af Amer: >60 mL/min (ref >60)
GFR calc non Af Amer: >60 mL/min (ref >60)
Glucose, Bld: 97 mg/dL (ref 70–99)
Potassium: 3.9 mmol/L (ref 3.5–5.1)
Sodium: 144 mmol/L (ref 135–145)

## 2018-08-12 LAB — I-STAT TROPONIN, ED: Troponin i, poc: 0.03 ng/mL (ref 0.00–0.08)

## 2018-08-12 LAB — CBC
HCT: 32.3 % — ABNORMAL LOW (ref 36.0–46.0)
Hemoglobin: 10.1 g/dL — ABNORMAL LOW (ref 12.0–15.0)
MCH: 28.6 pg (ref 26.0–34.0)
MCHC: 31.3 g/dL (ref 30.0–36.0)
MCV: 91.5 fL (ref 78.0–100.0)
Platelets: 259 K/uL (ref 150–400)
RBC: 3.53 MIL/uL — ABNORMAL LOW (ref 3.87–5.11)
RDW: 14.3 % (ref 11.5–15.5)
WBC: 8.1 K/uL (ref 4.0–10.5)

## 2018-08-12 LAB — I-STAT CG4 LACTIC ACID, ED
LACTIC ACID, VENOUS: 0.8 mmol/L (ref 0.5–1.9)
Lactic Acid, Venous: 0.96 mmol/L (ref 0.5–1.9)

## 2018-08-12 MED ORDER — SORBITOL 70 % SOLN
960.0000 mL | TOPICAL_OIL | Freq: Once | ORAL | Status: AC
  Administered 2018-08-13: 960 mL via RECTAL
  Filled 2018-08-12 (×2): qty 473

## 2018-08-12 NOTE — ED Provider Notes (Signed)
[facility] EMERGENCY DEPARTMENT Provider Note   CSN: [id number] Arrival date & time: 08/12/18  1954     History   Chief Complaint Chief Complaint  Patient presents with  . Constipation    HPI Bailey [name] is a 72 y.o. female.  HPI Patient where she had endarterectomy 8\28.  She has not had a bowel movement since before the procedure.  She reports she had urgency to go but been unable to pass stool.  She strains but nothing is really coming out.  She has tried some Dulcolax and 1 dose of MiraLAX without relief.  She denies she is having abdominal pain but does get rectal pain particularly which gets urgency to have a bowel movement.  No fever no chills.  Patient also has noted that since the procedure she gets an intermittent hard beat in her chest that almost makes her jump a little bit.  He has not had any ongoing chest pain.  No shortness of breath.  No near syncope or syncope with these episodes.  She reports it feels kind of shocking and unusual. Past Medical History:  Diagnosis Date  . Arthritis   . Asthma   . B12 deficiency anemia   . H/O aortic valve replacement 2013   at [facility]   . History of colon polyps   . History of kidney stones   . HTN (hypertension)   . Hyperlipidemia   . Left nephrolithiasis   . OSA (obstructive sleep apnea)    STUDY 12 YRS CPAP RX-- BUT NONTOLERANT  . Right ureteral stone   . S/P aortic valve replacement    2013  AT [facility]--- CARDIOLOGIST--  DR CRAWFORD [facility]  . Seasonal allergies   . Statin intolerance   . Type 2 diabetes mellitus ([place])    type 2    Patient Active Problem List   Diagnosis Date Noted  . Left carotid artery stenosis 08/05/2018  . OSA (obstructive sleep apnea) 01/20/2017  . Mild cognitive impairment 01/20/2017  . Carotid atherosclerosis, bilateral 01/20/2017  . Abnormal brain MRI 08/26/2016  . Inflammatory polyarthritis ([place]) 11/12/2015  . Erosive osteoarthritis of hand 11/12/2015  . History of  vitamin D deficiency 11/06/2015  . Hearing loss 08/24/2015  . Asthma, mild intermittent 08/23/2015  . Arthritis, degenerative 08/23/2015  . Dyslipidemia 08/23/2015  . H/O: hypothyroidism 08/23/2015  . Obesity (BMI 30-39.9) 08/23/2015  . HZV (herpes zoster virus) post herpetic neuralgia 08/23/2015  . History of renal stone 04/01/2014  . Diabetes mellitus, controlled ([place]) 04/01/2014  . Essential hypertension, benign 04/01/2014  . H/O prosthetic heart valve 06/15/2012  . CD (contact dermatitis) 07/25/2008    Past Surgical History:  Procedure Laterality Date  . AORTIC VALVE REPLACEMENT  06/2012   [facility]  . APPENDECTOMY  1958  . CHOLECYSTECTOMY  1978  . COLONOSCOPY  05-09-2014  . CYSTOSCOPY/RETROGRADE/URETEROSCOPY Right 07/04/2014   Procedure: CYSTOSCOPY/RETROGRADE/URETEROSCOPY;  Surgeon: Bernestine Amass, MD;  Location: [facility];  Service: Urology;  Laterality: Right;  . ENDARTERECTOMY Left 08/05/2018   Procedure: ENDARTERECTOMY CAROTID LEFT;  Surgeon: Waynetta Sandy, MD;  Location: [place] [facility];  Service: Vascular;  Laterality: Left;  . ESOPHAGOGASTRODUODENOSCOPY    . EXTRACORPOREAL SHOCK WAVE LITHOTRIPSY Right 06/ 2015     [place]  . HOLMIUM LASER APPLICATION Right 03/22/2439   Procedure: HOLMIUM LASER APPLICATION;  Surgeon: Bernestine Amass, MD;  Location: [facility];  Service: Urology;  Laterality: Right;  . PATCH ANGIOPLASTY Left 08/05/2018  Procedure: PATCH ANGIOPLASTY USING XENOSURE BIOLOGIC PATCH 1cm x 6cm;  Surgeon: Waynetta Sandy, MD;  Location: [facility];  Service: Vascular;  Laterality: Left;  . TONSILLECTOMY  1953     OB History   None      Home Medications    Prior to Admission medications   Medication Sig Start Date End Date Taking? Authorizing Provider  albuterol (PROAIR HFA) 108 (90 Base) MCG/ACT inhaler Inhale 2 puffs into the lungs every 4 (four) hours as needed for shortness of breath. 03/09/18  Yes Sowles, Drue Stager, MD    amoxicillin (AMOXIL) 500 MG capsule Take 4 capsules by mouth 30 - 60 minutes prior to dental procedures 07/13/18  Yes Dunn, Nedra Hai, PA-C  aspirin EC 81 MG tablet Take 81 mg by mouth daily.   Yes [provider]  B Complex CAPS Take 1 tablet by mouth 2 (two) times daily.   Yes [provider]  budesonide (PULMICORT) 0.5 MG/2ML nebulizer solution Take 2 mLs (0.5 mg total) by nebulization as needed. Patient taking differently: Take 0.5 mg by nebulization as needed (for wheezing).  03/09/18  Yes Sowles, Drue Stager, MD  Cholecalciferol (VITAMIN D) 2000 UNITS tablet Take 2,000 Units by mouth every other day.    Yes [provider]  clopidogrel (PLAVIX) 75 MG tablet Take 1 tablet (75 mg total) by mouth daily. Patient taking differently: Take 75 mg by mouth at bedtime.  07/10/18  Yes Nickel, Sharmon Leyden, NP  Coenzyme Q10 (COQ10) 150 MG CAPS Take 150 mg by mouth 2 (two) times daily.    Yes [provider]  diclofenac sodium (VOLTAREN) 1 % GEL Apply 2 g topically 4 (four) times daily. Patient taking differently: Apply 2 g topically daily as needed (neck pain).  07/10/18  Yes Yu, Amy V, PA-C  enalapril (VASOTEC) 10 MG tablet TAKE 1 TABLET(10 MG) BY MOUTH DAILY Patient taking differently: Take 10 mg by mouth at bedtime.  03/09/18  Yes Sowles, Drue Stager, MD  MAG THREONATE-NIACINAMIDE ER PO Take 800 mg by mouth 2 (two) times daily.   Yes [provider]  metFORMIN (GLUCOPHAGE) 500 MG tablet Take 1 tablet (500 mg total) by mouth 2 (two) times daily with a meal. Patient taking differently: Take 500 mg by mouth daily as needed (blood sugar higher than 120).  03/09/18  Yes Sowles, Drue Stager, MD  mupirocin ointment (BACTROBAN) 2 % Place 1 application into the nose 2 (two) times daily. Patient started 08/04/18 completed 3 doses will need 7 more 08/04/18  Yes [provider]  Probiotic Product (PROBIOTIC DAILY PO) Take 1 capsule by mouth daily.   Yes [provider]  glycerin  adult 2 g suppository Place 1 suppository rectally as needed for constipation. 08/13/18   Charlesetta Shanks, MD  polyethylene glycol (MIRALAX / GLYCOLAX) packet Take 17 g by mouth daily. 08/13/18   Charlesetta Shanks, MD    Family History Family History  Problem Relation Age of Onset  . Heart disease Mother   . Heart disease Father   . Asthma Daughter   . Asthma Maternal Grandfather   . Heart disease Sister   . Heart attack Sister 43  . Colon cancer Neg Hx     Social History Social History   Tobacco Use  . Smoking status: Never Smoker  . Smokeless tobacco: Never Used  Substance Use Topics  . Alcohol use: No    Alcohol/week: 0.0 standard drinks  . Drug use: No     Allergies  Dilaudid [hydromorphone hcl]; Naproxen; Nsaids; Percocet [oxycodone-acetaminophen]; Latex; Other; Rosuvastatin; and Morphine and related   Review of Systems Review of Systems 10 Systems reviewed and are negative for acute change except as noted in the HPI.   Physical Exam Updated Vital Signs BP 136/77   Pulse 72   Temp 98.4 F (36.9 C) (Oral)   Resp 17   Ht 5\' 2"  (1.575 m)   Wt 79.4 kg   SpO2 97%   BMI 32.01 kg/m   Physical Exam  Constitutional: She is oriented to person, place, and time. She appears well-developed and well-nourished. No distress.  HENT:  Head: Normocephalic and atraumatic.  Eyes: EOM are normal.  Neck: Neck supple.  Patient has a well-healing endarterectomy wound on the left side of the neck.  No drainage, no discharge.  No erythema.  No swelling atypical for postoperative state.  Cardiovascular: Normal rate, regular rhythm, normal heart sounds and intact distal pulses.  Pulmonary/Chest: Effort normal and breath sounds normal.  Abdominal: Soft. She exhibits no distension and no mass. There is no tenderness. There is no guarding.  Genitourinary:  Genitourinary Comments: 2, nonthrombosed hemorrhoids.  Digital exam is 4 moderately firm stool impaction.  Stool is brown.  No  melena no visible blood.  Musculoskeletal: Normal range of motion. She exhibits no edema or tenderness.  Neurological: She is alert and oriented to person, place, and time. No cranial nerve deficit. She exhibits normal muscle tone. Coordination normal.  Skin: Skin is warm and dry.  Psychiatric: She has a normal mood and affect.     ED Treatments / Results  Labs (all labs ordered are listed, but only abnormal results are displayed) Labs Reviewed  CBC - Abnormal; Notable for the following components:      Result Value   RBC 3.53 (*)    Hemoglobin 10.1 (*)    HCT 32.3 (*)    All other components within normal limits  BASIC METABOLIC PANEL  I-STAT CG4 LACTIC ACID, ED  I-STAT CG4 LACTIC ACID, ED  I-STAT TROPONIN, ED    EKG EKG Interpretation  Date/Time:  Wednesday August 12 2018 23:06:51 EDT Ventricular Rate:  71 PR Interval:    QRS Duration: 90 QT Interval:  405 QTC Calculation: 441 R Axis:   58 Text Interpretation:  Sinus rhythm Low voltage, precordial leads normal, no change from previous Confirmed by Charlesetta Shanks [id number]) on 08/13/2018 12:17:14 AM   Radiology Dg Abdomen Acute W/chest  Result Date: 08/12/2018 CLINICAL DATA:  Constipation and bloating EXAM: DG ABDOMEN ACUTE W/ 1V CHEST COMPARISON:  07/10/2017 FINDINGS: Single-view chest demonstrates post sternotomy changes. Streaky atelectasis or scarring at the bases. Stable borderline cardiomegaly. Supine and upright views of the abdomen demonstrate surgical clips in the right upper quadrant. Few gas-filled loops of small bowel without obstructive pattern. Moderate stool in the colon and rectum. No free air beneath the diaphragm. IMPRESSION: Overall nonobstructed bowel gas pattern with moderate feces in the colon and rectum. No acute cardiopulmonary disease. Electronically Signed   By: Donavan Foil M.D.   On: 08/12/2018 21:32    Procedures Procedures (including critical care time)  Medications Ordered in  ED Medications  sorbitol, milk of mag, mineral oil, glycerin (SMOG) enema (has no administration in time range)     Initial Impression / Assessment and Plan / ED Course  I have reviewed the triage vital signs and the nursing notes.  Pertinent labs & imaging results that were available during my care of the patient were  reviewed by me and considered in my medical decision making (see chart for details).    Patient is clinically well in appearance.  She is nontoxic.  Abdomen is soft without guarding.  Rectal exam does have fecal impaction however it is not severely hard or immobile stool.  Will try smog enema.  Patient has had formed brown stool past with enema.  Will continue to administer the remainder of smog enema.  Plan will be for continued MiraLAX and glycerin suppositories at home.  Final Clinical Impressions(s) / ED Diagnoses   Final diagnoses:  Constipation, unspecified constipation type  Fecal impaction in rectum [facility])  Status post carotid endarterectomy  Palpitations   Patient has constipation which is responding well to smog enema.  No abdominal pain to palpation.  No rectal bleeding.  Patient described palpitations.  These occur intermittently without associated chest pain or near syncope or lightheadedness.  EKG is normal in the emergency department.  Patient has no signs of cardiac ischemia or dysrhythmia.  Recommendations for follow-up with her PCP and/or cardiology for evaluation of palpitations and possible placement of Holter or event monitor.  Patient endarterectomy site is in excellent condition.  This is healing well.  No apparent immediate complications with the endarterectomy. ED Discharge Orders         Ordered    polyethylene glycol (MIRALAX / GLYCOLAX) packet  Daily     08/13/18 0014    glycerin adult 2 g suppository  As needed     08/13/18 0014           Charlesetta Shanks, MD 08/13/18 7471

## 2018-08-12 NOTE — Telephone Encounter (Signed)
Copied from [place] [id number]. Topic: Quick Communication - Rx Refill/Question >> Aug 12, 2018 10:20 AM Oliver Pila B wrote: Medication: Constipation  Pt called b/c she has severe constipation and wants a medication called in for it; contact pt to advise

## 2018-08-12 NOTE — Telephone Encounter (Signed)
She was recently hospitalized . Very common, she needs to take miralax otc, drink fluids and try to stand up or walk  Please make sure she is passing gas

## 2018-08-12 NOTE — Telephone Encounter (Signed)
Patient informed and states since she got home on Monday she has been active and taking Dulcolax and drinking a lot of liquid at least 16 oz daily. She is still not passing gas. She has not had a bowel movement for an entire week. She has the sensation of wanting to go but can't. Please advise.

## 2018-08-12 NOTE — ED Triage Notes (Signed)
Pt reports recent endarterectomy on 8/28 and hospital admission. Pt reports has been unable to have a BM since before surgery. Pt reports attempting to strain which caused an "oddness" in her chest but denies pain. Pt reports rectal pain with some bleeding, no stool, denies passing gas. Bowel sounds noted in triage. Pt has not been taking any narcotics for pain.

## 2018-08-12 NOTE — ED Provider Notes (Signed)
Patient placed in Quick Look pathway, seen and evaluated   Chief Complaint: constipation   HPI: 72 year old female with a history of appendectomy and cholecystectomy presenting to the emergency department with a chief complaint of constipation. She had a left carotid endarterectomy on 8/28. Was in the ICU for 5 days. Last BM was prior to her surgery.   She had strained to have a BM earlier today and almost passed out, but did not pass any stool, but noted some bright red blood on the toilet tissue.  She has had 2 out of 10 rectal pain that is worse with sitting since.  She reports associated left lower quadrant abdominal pain.  No history of hemorrhoids.  She states that she was able to pass a small amount of gas to get discharged from the hospital, but has not had any flatus for at least one day.  She denies nausea, vomiting, fever, or chills.  ROS: Constipation, denies flatus, rectal pain, abdominal pain  Physical Exam:   Gen: No distress  Neuro: Awake and Alert  Skin: Warm    Focused Exam: Bowel sounds present in all 4 quadrants.  Abdomen is soft, nondistended.  Tender to palpation in the left lower quadrant with guarding.  No rebound.   Initiation of care has begun. The patient has been counseled on the process, plan, and necessity for staying for the completion/evaluation, and the remainder of the medical screening examination    Joanne Gavel, PA-C 08/12/18 2029    Charlesetta Shanks, MD 08/27/18 1051

## 2018-08-13 DIAGNOSIS — K5900 Constipation, unspecified: Secondary | ICD-10-CM | POA: Diagnosis not present

## 2018-08-13 MED ORDER — POLYETHYLENE GLYCOL 3350 17 G PO PACK: 17.0000 g | PACK | Freq: Every day | ORAL | 0 refills | Status: DC

## 2018-08-13 MED ORDER — GLYCERIN (ADULT) 2 G RE SUPP: 1.0000 | RECTAL | 0 refills | Status: DC | PRN

## 2018-08-13 NOTE — Discharge Instructions (Signed)
1.  Take MiraLAX daily until you are having regular bowel movements.  Use a glycerin suppository twice daily if needed. 2.  Follow-up with your cardiologist your family doctor regarding palpitations.  At this time, your EKG is normal but you may need to have a monitor that you wear continuously to identify episodes of extra beats or palpitations.  Return to the emergency department if you have any episodes of chest pain, feeling like you will pass out or repeat sensation of your heart beating hard or racing.

## 2018-08-14 NOTE — Discharge Summary (Signed)
[facility] Discharge Summary   Patient ID:  Bailey [name] MRN: [id number] DOB/AGE: 1946-09-28 72 y.o.  Admit date: 08/05/2018 Discharge date: 08/10/2018 Date of Surgery: 08/05/2018 Surgeon: Surgeon(s): Waynetta Sandy, MD  Admission Diagnosis: Left carotid stenosis  Discharge Diagnoses:  Left carotid stenosis  Secondary Diagnoses: Past Medical History:  Diagnosis Date  . Arthritis   . Asthma   . B12 deficiency anemia   . H/O aortic valve replacement 2013   at [facility]   . History of colon polyps   . History of kidney stones   . HTN (hypertension)   . Hyperlipidemia   . Left nephrolithiasis   . OSA (obstructive sleep apnea)    STUDY 12 YRS CPAP RX-- BUT NONTOLERANT  . Right ureteral stone   . S/P aortic valve replacement    2013  AT [facility]--- CARDIOLOGIST--  DR CRAWFORD [facility]  . Seasonal allergies   . Statin intolerance   . Type 2 diabetes mellitus (HCC)    type 2    Procedure(s): ENDARTERECTOMY CAROTID LEFT PATCH ANGIOPLASTY USING XENOSURE BIOLOGIC PATCH 1cm x 6cm  Discharged Condition: good  HPI: 72 y/o female was first seen in our office by Dr. Scot Dock on 11/21/2016.  She had an asymptomatic left carotid stenosis < 80%.   On follow up surveillance her carotid duplex demonstrated 80-99% stenosis.  Dr. Scot Dock was not available so she was seen by Dr. Donzetta Matters.     Hospital Course:  BRIGET SHAHEED is a 72 y.o. female is S/P Left Procedure(s): ENDARTERECTOMY CAROTID LEFT PATCH ANGIOPLASTY USING XENOSURE BIOLOGIC PATCH 1cm x 6cm  Post op she was hypotensive and required dopamine for 48 hours.  She felt unsteady with her gait when ambulating.  On exam she had no neurologic deficits, No facial asymmetry tongue midline.  By post op day 5 she was ambulating independently with a rolling walker with a steady gait.  Her incision was healing well.  Medically stable for discharge home.  Significant Diagnostic Studies: CBC Lab Results  Component  Value Date   WBC 8.1 08/12/2018   HGB 10.1 (L) 08/12/2018   HCT 32.3 (L) 08/12/2018   MCV 91.5 08/12/2018   PLT 259 08/12/2018    BMET    Component Value Date/Time   NA 144 08/12/2018 2023   NA 142 08/24/2015 1023   NA 138 12/24/2014 2021   K 3.9 08/12/2018 2023   K 4.0 12/24/2014 2021   CL 107 08/12/2018 2023   CL 100 12/24/2014 2021   CO2 25 08/12/2018 2023   CO2 30 12/24/2014 2021   GLUCOSE 97 08/12/2018 2023   GLUCOSE 93 12/24/2014 2021   BUN 10 08/12/2018 2023   BUN 16 08/24/2015 1023   BUN 21 (H) 12/24/2014 2021   CREATININE 0.64 08/12/2018 2023   CREATININE 0.85 07/07/2018 1121   CALCIUM 9.6 08/12/2018 2023   CALCIUM 8.8 12/24/2014 2021   GFRNONAA >60 08/12/2018 2023   GFRNONAA 69 07/07/2018 1121   GFRAA >60 08/12/2018 2023   GFRAA 80 07/07/2018 1121   COAG Lab Results  Component Value Date   INR 0.96 08/04/2018   INR 1.04 04/01/2014     Disposition:  Discharge to :Home Discharge Instructions    Call MD for:  redness, tenderness, or signs of infection (pain, swelling, bleeding, redness, odor or green/yellow discharge around incision site)   Complete by:  As directed    Call MD for:  severe or increased pain, loss or decreased feeling  in affected limb(s)   Complete by:  As directed    Call MD for:  temperature >100.5   Complete by:  As directed    Resume previous diet   Complete by:  As directed      Allergies as of 08/10/2018      Reactions   Dilaudid [hydromorphone Hcl] Other (See Comments)   Severe hypotension - pt states that if she is given this medication it will kill her   Naproxen Shortness Of Breath   Nsaids Shortness Of Breath, Other (See Comments)   Causes asthma   Percocet [oxycodone-acetaminophen] Shortness Of Breath   Can tolerate oxy IR   Latex Hives   Other Swelling   Eye drop preservative Specific agent unspecified   Rosuvastatin Other (See Comments)   myalgia      Medication List    STOP taking these medications    predniSONE 20 MG tablet Commonly known as:  DELTASONE     TAKE these medications   albuterol 108 (90 Base) MCG/ACT inhaler Commonly known as:  PROVENTIL HFA;VENTOLIN HFA Inhale 2 puffs into the lungs every 4 (four) hours as needed for shortness of breath.   amoxicillin 500 MG capsule Commonly known as:  AMOXIL Take 4 capsules by mouth 30 - 60 minutes prior to dental procedures   aspirin EC 81 MG tablet Take 81 mg by mouth daily.   B Complex Caps Take 1 tablet by mouth 2 (two) times daily.   budesonide 0.5 MG/2ML nebulizer solution Commonly known as:  PULMICORT Take 2 mLs (0.5 mg total) by nebulization as needed. What changed:  reasons to take this   clopidogrel 75 MG tablet Commonly known as:  PLAVIX Take 1 tablet (75 mg total) by mouth daily. What changed:  when to take this   COQ10 150 MG Caps Take 150 mg by mouth 2 (two) times daily.   diclofenac sodium 1 % Gel Commonly known as:  VOLTAREN Apply 2 g topically 4 (four) times daily. What changed:    when to take this  reasons to take this   enalapril 10 MG tablet Commonly known as:  VASOTEC TAKE 1 TABLET(10 MG) BY MOUTH DAILY What changed:    how much to take  how to take this  when to take this  additional instructions   MAG THREONATE-NIACINAMIDE ER PO Take 800 mg by mouth 2 (two) times daily.   metFORMIN 500 MG tablet Commonly known as:  GLUCOPHAGE Take 1 tablet (500 mg total) by mouth 2 (two) times daily with a meal. What changed:    when to take this  reasons to take this   mupirocin ointment 2 % Commonly known as:  BACTROBAN Place 1 application into the nose 2 (two) times daily. Patient started 08/04/18 completed 3 doses will need 7 more   PROBIOTIC DAILY PO Take 1 capsule by mouth daily.   Vitamin D 2000 units tablet Take 2,000 Units by mouth every other day.      Verbal and written Discharge instructions given to the patient. Wound care per Discharge AVS Follow-up Information     Waynetta Sandy, MD In 3 weeks.   Specialties:  Vascular Surgery, Cardiology Why:  Office will call you to arrange your appt (sent) Contact information: [address] [place] [address] [phone]           Signed: Roxy Horseman 08/14/2018, 9:11 AM --- For VQI Registry use --- Instructions: Press F2 to tab through selections.  Delete  question if not applicable.   Modified Rankin score at D/C (0-6): Rankin Score=0  IV medication needed for:  1. Hypertension: No 2. Hypotension: Yes  Post-op Complications: Yes requiring Dopamine  1. Post-op CVA or TIA: No  If yes: Event classification (right eye, left eye, right cortical, left cortical, verterobasilar, other):   If yes: Timing of event (intra-op, <6 hrs post-op, >=6 hrs post-op, unknown):   2. CN injury: No  If yes: CN  injuried   3. Myocardial infarction: No  If yes: Dx by (EKG or clinical, Troponin):   4.  CHF: No  5.  Dysrhythmia (new): No  6. Wound infection: No  7. Reperfusion symptoms: No  8. Return to OR: No  If yes: return to OR for (bleeding, neurologic, other CEA incision, other):   Discharge medications: Statin use:  No  for medical reason   ASA use:  Yes Beta blocker use:  No  for medical reason   ACE-Inhibitor use:  No  for medical reason   P2Y12 Antagonist use: [ ]  None, [ ]  Plavix, [ ]  Plasugrel, [ ]  Ticlopinine, [ ]  Ticagrelor, [ ]  Other, [ ]  No for medical reason, [ ]  Non-compliant, [ ]  Not-indicated Anti-coagulant use:  [ ]  None, [ ]  Warfarin, [ ]  Rivaroxaban, [ ]  Dabigatran, [ ]  Other, [ ]  No for medical reason, [ ]  Non-compliant, [ ]  Not-indicated

## 2018-08-25 DIAGNOSIS — Z6831 Body mass index (BMI) 31.0-31.9, adult: Secondary | ICD-10-CM | POA: Diagnosis not present

## 2018-08-25 DIAGNOSIS — R51 Headache: Secondary | ICD-10-CM | POA: Diagnosis not present

## 2018-08-25 DIAGNOSIS — M154 Erosive (osteo)arthritis: Secondary | ICD-10-CM | POA: Diagnosis not present

## 2018-08-25 DIAGNOSIS — M064 Inflammatory polyarthropathy: Secondary | ICD-10-CM | POA: Diagnosis not present

## 2018-08-25 DIAGNOSIS — E669 Obesity, unspecified: Secondary | ICD-10-CM | POA: Diagnosis not present

## 2018-08-25 DIAGNOSIS — M2550 Pain in unspecified joint: Secondary | ICD-10-CM | POA: Diagnosis not present

## 2018-08-28 ENCOUNTER — Ambulatory Visit (INDEPENDENT_AMBULATORY_CARE_PROVIDER_SITE_OTHER): Payer: Medicare Other

## 2018-08-28 VITALS — BP 126/72 | HR 66 | Temp 98.1°F | Resp 12 | Ht 62.0 in | Wt 168.8 lb

## 2018-08-28 DIAGNOSIS — Z0000 Encounter for general adult medical examination without abnormal findings: Secondary | ICD-10-CM | POA: Diagnosis not present

## 2018-08-28 NOTE — Patient Instructions (Signed)
Bailey [name] , Thank you for taking time to come for your Medicare Wellness Visit. I appreciate your ongoing commitment to your health goals. Please review the following plan we discussed and let me know if I can assist you in the future.   Screening recommendations/referrals: Colorectal Screening: Please call to schedule your appointment Mammogram: Please call to schedule your appointment Bone Density: Declined  Vision and Dental Exams: Recommended annual ophthalmology exams for early detection of glaucoma and other disorders of the eye Recommended annual dental exams for proper oral hygiene  Diabetic Exams: Diabetic Eye Exam: Up to date Diabetic Foot Exam: Please call to schedule your appointment  Vaccinations: Influenza vaccine: Declined Pneumococcal vaccine: Up to date Tdap vaccine: Please call your insurance company to determine your out of pocket expense. You may also receive this vaccine at your local pharmacy or Health Dept. Shingles vaccine: Please call your insurance company to determine your out of pocket expense for the Shingrix vaccine. You may receive this vaccine at your local pharmacy.  Advanced directives: Advance directives discussed with you today. You have declined to receive documents for completion.  Goals: Recommend to drink at least 6-8 8oz glasses of water per day.  Next appointment: Please schedule your Annual Wellness Visit with your Nurse Health Advisor in one year.  Preventive Care 11 Years and Older, Female Preventive care refers to lifestyle choices and visits with your health care provider that can promote health and wellness. What does preventive care include?  A yearly physical exam. This is also called an annual well check.  Dental exams once or twice a year.  Routine eye exams. Ask your health care provider how often you should have your eyes checked.  Personal lifestyle choices, including:  Daily care of your teeth and gums.  Regular  physical activity.  Eating a healthy diet.  Avoiding tobacco and drug use.  Limiting alcohol use.  Practicing safe sex.  Taking low-dose aspirin every day.  Taking vitamin and mineral supplements as recommended by your health care provider. What happens during an annual well check? The services and screenings done by your health care provider during your annual well check will depend on your age, overall health, lifestyle risk factors, and family history of disease. Counseling  Your health care provider may ask you questions about your:  Alcohol use.  Tobacco use.  Drug use.  Emotional well-being.  Home and relationship well-being.  Sexual activity.  Eating habits.  History of falls.  Memory and ability to understand (cognition).  Work and work Statistician.  Reproductive health. Screening  You may have the following tests or measurements:  Height, weight, and BMI.  Blood pressure.  Lipid and cholesterol levels. These may be checked every 5 years, or more frequently if you are over 24 years old.  Skin check.  Lung cancer screening. You may have this screening every year starting at age 87 if you have a 30-pack-year history of smoking and currently smoke or have quit within the past 15 years.  Fecal occult blood test (FOBT) of the stool. You may have this test every year starting at age 60.  Flexible sigmoidoscopy or colonoscopy. You may have a sigmoidoscopy every 5 years or a colonoscopy every 10 years starting at age 13.  Hepatitis C blood test.  Hepatitis B blood test.  Sexually transmitted disease (STD) testing.  Diabetes screening. This is done by checking your blood sugar (glucose) after you have not eaten for a while (fasting). You may have  this done every 1-3 years.  Bone density scan. This is done to screen for osteoporosis. You may have this done starting at age 15.  Mammogram. This may be done every 1-2 years. Talk to your health care  provider about how often you should have regular mammograms. Talk with your health care provider about your test results, treatment options, and if necessary, the need for more tests. Vaccines  Your health care provider may recommend certain vaccines, such as:  Influenza vaccine. This is recommended every year.  Tetanus, diphtheria, and acellular pertussis (Tdap, Td) vaccine. You may need a Td booster every 10 years.  Zoster vaccine. You may need this after age 11.  Pneumococcal 13-valent conjugate (PCV13) vaccine. One dose is recommended after age 16.  Pneumococcal polysaccharide (PPSV23) vaccine. One dose is recommended after age 63. Talk to your health care provider about which screenings and vaccines you need and how often you need them. This information is not intended to replace advice given to you by your health care provider. Make sure you discuss any questions you have with your health care provider. Document Released: 12/22/2015 Document Revised: 08/14/2016 Document Reviewed: 09/26/2015 Elsevier Interactive Patient Education  2017 [place] Prevention in the Home Falls can cause injuries. They can happen to people of all ages. There are many things you can do to make your home safe and to help prevent falls. What can I do on the outside of my home?  Regularly fix the edges of walkways and driveways and fix any cracks.  Remove anything that might make you trip as you walk through a door, such as a raised step or threshold.  Trim any bushes or trees on the path to your home.  Use bright outdoor lighting.  Clear any walking paths of anything that might make someone trip, such as rocks or tools.  Regularly check to see if handrails are loose or broken. Make sure that both sides of any steps have handrails.  Any raised decks and porches should have guardrails on the edges.  Have any leaves, snow, or ice cleared regularly.  Use sand or salt on walking paths  during winter.  Clean up any spills in your garage right away. This includes oil or grease spills. What can I do in the bathroom?  Use night lights.  Install grab bars by the toilet and in the tub and shower. Do not use towel bars as grab bars.  Use non-skid mats or decals in the tub or shower.  If you need to sit down in the shower, use a plastic, non-slip stool.  Keep the floor dry. Clean up any water that spills on the floor as soon as it happens.  Remove soap buildup in the tub or shower regularly.  Attach bath mats securely with double-sided non-slip rug tape.  Do not have throw rugs and other things on the floor that can make you trip. What can I do in the bedroom?  Use night lights.  Make sure that you have a light by your bed that is easy to reach.  Do not use any sheets or blankets that are too big for your bed. They should not hang down onto the floor.  Have a firm chair that has side arms. You can use this for support while you get dressed.  Do not have throw rugs and other things on the floor that can make you trip. What can I do in the kitchen?  Clean up any  spills right away.  Avoid walking on wet floors.  Keep items that you use a lot in easy-to-reach places.  If you need to reach something above you, use a strong step stool that has a grab bar.  Keep electrical cords out of the way.  Do not use floor polish or wax that makes floors slippery. If you must use wax, use non-skid floor wax.  Do not have throw rugs and other things on the floor that can make you trip. What can I do with my stairs?  Do not leave any items on the stairs.  Make sure that there are handrails on both sides of the stairs and use them. Fix handrails that are broken or loose. Make sure that handrails are as long as the stairways.  Check any carpeting to make sure that it is firmly attached to the stairs. Fix any carpet that is loose or worn.  Avoid having throw rugs at the top  or bottom of the stairs. If you do have throw rugs, attach them to the floor with carpet tape.  Make sure that you have a light switch at the top of the stairs and the bottom of the stairs. If you do not have them, ask someone to add them for you. What else can I do to help prevent falls?  Wear shoes that:  Do not have high heels.  Have rubber bottoms.  Are comfortable and fit you well.  Are closed at the toe. Do not wear sandals.  If you use a stepladder:  Make sure that it is fully opened. Do not climb a closed stepladder.  Make sure that both sides of the stepladder are locked into place.  Ask someone to hold it for you, if possible.  Clearly mark and make sure that you can see:  Any grab bars or handrails.  First and last steps.  Where the edge of each step is.  Use tools that help you move around (mobility aids) if they are needed. These include:  Canes.  Walkers.  Scooters.  Crutches.  Turn on the lights when you go into a dark area. Replace any light bulbs as soon as they burn out.  Set up your furniture so you have a clear path. Avoid moving your furniture around.  If any of your floors are uneven, fix them.  If there are any pets around you, be aware of where they are.  Review your medicines with your doctor. Some medicines can make you feel dizzy. This can increase your chance of falling. Ask your doctor what other things that you can do to help prevent falls. This information is not intended to replace advice given to you by your health care provider. Make sure you discuss any questions you have with your health care provider. Document Released: 09/21/2009 Document Revised: 05/02/2016 Document Reviewed: 12/30/2014 Elsevier Interactive Patient Education  2017 [employer].

## 2018-08-28 NOTE — Addendum Note (Signed)
Addended by: Hardie Pulley, Tara Wich J on: 08/28/2018 12:12 PM   Modules accepted: SmartSet

## 2018-08-28 NOTE — Progress Notes (Addendum)
Subjective:   Bailey [name] is a 72 y.o. female who presents for an Initial Medicare Annual Wellness Visit.  Review of Systems    N/A  Cardiac Risk Factors include: advanced age (>68men, >56 women);diabetes mellitus;dyslipidemia;obesity (BMI >30kg/m2);hypertension;sedentary lifestyle     Objective:    Today's Vitals   08/28/18 0836  BP: 126/72  Pulse: 66  Resp: 12  Temp: 98.1 F (36.7 C)  TempSrc: Oral  SpO2: 98%  Weight: 168 lb 12.8 oz (76.6 kg)  Height: 5\' 2"  (1.575 m)   Body mass index is 30.87 kg/m.  Advanced Directives 08/28/2018 08/12/2018 08/05/2018 08/04/2018 08/18/2017 07/10/2017 03/27/2017  Does Patient Have a Medical Advance Directive? No No No No No No No  Would patient like information on creating a medical advance directive? No - Patient declined - No - Patient declined No - Patient declined - - -  Pre-existing out of facility DNR order (yellow form or pink MOST form) - - - - - - -    Current Medications (verified) Outpatient Encounter Medications as of 08/28/2018  Medication Sig  . albuterol (PROAIR HFA) 108 (90 Base) MCG/ACT inhaler Inhale 2 puffs into the lungs every 4 (four) hours as needed for shortness of breath.  Marland Kitchen amoxicillin (AMOXIL) 500 MG capsule Take 4 capsules by mouth 30 - 60 minutes prior to dental procedures  . Ascorbic Acid (VITAMIN C) 100 MG tablet Take 100 mg by mouth daily.  . B Complex CAPS Take 1 tablet by mouth 2 (two) times daily.  . budesonide (PULMICORT) 0.5 MG/2ML nebulizer solution Take 2 mLs (0.5 mg total) by nebulization as needed. (Patient taking differently: Take 0.5 mg by nebulization as needed (for wheezing). )  . Cholecalciferol (VITAMIN D) 2000 UNITS tablet Take 2,000 Units by mouth every other day.   . clopidogrel (PLAVIX) 75 MG tablet Take 1 tablet (75 mg total) by mouth daily. (Patient taking differently: Take 75 mg by mouth at bedtime. )  . Coenzyme Q10 (COQ10) 150 MG CAPS Take 150 mg by mouth 2 (two) times daily.   .  diclofenac sodium (VOLTAREN) 1 % GEL Apply 2 g topically 4 (four) times daily. (Patient taking differently: Apply 2 g topically daily as needed (neck pain). )  . enalapril (VASOTEC) 10 MG tablet TAKE 1 TABLET(10 MG) BY MOUTH DAILY (Patient taking differently: Take 10 mg by mouth at bedtime. )  . glycerin adult 2 g suppository Place 1 suppository rectally as needed for constipation.  Javier Docker Oil 1000 MG CAPS Take 1 capsule by mouth daily.  Marland Kitchen MAG THREONATE-NIACINAMIDE ER PO Take 800 mg by mouth 2 (two) times daily.  . metFORMIN (GLUCOPHAGE) 500 MG tablet Take 1 tablet (500 mg total) by mouth 2 (two) times daily with a meal. (Patient taking differently: Take 500 mg by mouth daily as needed (blood sugar higher than 120). )  . polyethylene glycol (MIRALAX / GLYCOLAX) packet Take 17 g by mouth daily.  . Probiotic Product (PROBIOTIC DAILY PO) Take 1 capsule by mouth daily.  Marland Kitchen aspirin EC 81 MG tablet Take 81 mg by mouth daily.  . mupirocin ointment (BACTROBAN) 2 % Place 1 application into the nose 2 (two) times daily. Patient started 08/04/18 completed 3 doses will need 7 more   No facility-administered encounter medications on file as of 08/28/2018.     Allergies (verified) Dilaudid [hydromorphone hcl]; Naproxen; Nsaids; Percocet [oxycodone-acetaminophen]; Latex; Other; Rosuvastatin; and Morphine and related   History: Past Medical History:  Diagnosis Date  .  Arthritis   . Asthma   . B12 deficiency anemia   . H/O aortic valve replacement 2013   at [facility]   . History of colon polyps   . History of kidney stones   . HTN (hypertension)   . Hyperlipidemia   . Left nephrolithiasis   . OSA (obstructive sleep apnea)    STUDY 12 YRS CPAP RX-- BUT NONTOLERANT  . Right ureteral stone   . S/P aortic valve replacement    2013  AT [facility]--- CARDIOLOGIST--  DR CRAWFORD AT [facility]  . Seasonal allergies   . Statin intolerance   . Type 2 diabetes mellitus ([place])    type 2   Past Surgical History:  Procedure  Laterality Date  . AORTIC VALVE REPLACEMENT  06/2012   [facility]  . APPENDECTOMY  1958  . CHOLECYSTECTOMY  1978  . COLONOSCOPY  05-09-2014  . CYSTOSCOPY/RETROGRADE/URETEROSCOPY Right 07/04/2014   Procedure: CYSTOSCOPY/RETROGRADE/URETEROSCOPY;  Surgeon: Bernestine Amass, MD;  Location: [facility];  Service: Urology;  Laterality: Right;  . ENDARTERECTOMY Left 08/05/2018   Procedure: ENDARTERECTOMY CAROTID LEFT;  Surgeon: Waynetta Sandy, MD;  Location: [place];  Service: Vascular;  Laterality: Left;  . ESOPHAGOGASTRODUODENOSCOPY    . EXTRACORPOREAL SHOCK WAVE LITHOTRIPSY Right 06/ 2015     [facility]  . HOLMIUM LASER APPLICATION Right 8/85/0277   Procedure: HOLMIUM LASER APPLICATION;  Surgeon: Bernestine Amass, MD;  Location: [facility];  Service: Urology;  Laterality: Right;  . PATCH ANGIOPLASTY Left 08/05/2018   Procedure: PATCH ANGIOPLASTY USING XENOSURE BIOLOGIC PATCH 1cm x 6cm;  Surgeon: Waynetta Sandy, MD;  Location: [place];  Service: Vascular;  Laterality: Left;  . TONSILLECTOMY  1953   Family History  Problem Relation Age of Onset  . Heart disease Mother   . Heart disease Father   . Asthma Daughter   . Asthma Maternal Grandfather   . Heart disease Sister   . Heart attack Sister 51  . Heart disease Sister   . Heart disease Sister   . Colon cancer Neg Hx    Social History   Socioeconomic History  . Marital status: Single    Spouse name: Not on file  . Number of children: 2  . Years of education: some college  . Highest education level: 12th grade  Occupational History  . Occupation: Works for [employer]    Employer: [place]  . Financial resource strain: Not hard at all  . Food insecurity:    Worry: Never true    Inability: Never true  . Transportation needs:    Medical: No    Non-medical: No  Tobacco Use  . Smoking status: Never Smoker  . Smokeless tobacco: Never Used  . Tobacco comment:  smoking cessation materials not required  Substance and Sexual Activity  . Alcohol use: No    Alcohol/week: 0.0 standard drinks  . Drug use: No  . Sexual activity: Not Currently  Lifestyle  . Physical activity:    Days per week: 3 days    Minutes per session: 50 min  . Stress: Not at all  Relationships  . Social connections:    Talks on phone: Patient refused    Gets together: Patient refused    Attends religious service: Patient refused    Active member of club or organization: Patient refused    Attends meetings of clubs or organizations: Patient refused    Relationship status: Patient refused  Other Topics Concern  .  Not on file  Social History Narrative   Divorced, 2 children. Works as a Counsellor. Drinks 2-3 caffeinated beverages/day    Tobacco Counseling Counseling given: No Comment: smoking cessation materials not required  Clinical Intake:  Pre-visit preparation completed: Yes  Pain : No/denies pain   BMI - recorded: 32 Nutritional Status: BMI > 30  Obese Nutritional Risks: None  Nutrition Risk Assessment: Has the patient had any N/V/D within the last 2 months?  No  Does the patient have any non-healing wounds?  No  Has the patient had any unintentional weight loss or weight gain?  No   Is the patient diabetic?  Yes  If diabetic, was a CBG obtained today?  No  Did the patient bring in their glucometer from home?  No  How often do you monitor your CBG's? Daily.  Are you having any financial strains with the device, your supplies or your medication? No .  Does the patient want a Data processing manager Referral sent to the Care Guide for [employer] for their medication(s)?  No   Diabetic Exams: Diabetic Eye Exam: Completed 03/06/18.  Diabetic Foot Exam: Completed 07/25/17. Pt has been advised about the importance in completing this exam. Pt has been advised to schedule an appt for some time in Oct per Dr. Ancil Boozer OV from 03/2018  How often do you need to  have someone help you when you read instructions, pamphlets, or other written materials from your doctor or pharmacy?: 1 - Never  Interpreter Needed?: No  Information entered by :: AEversole, LPN   Activities of Daily Living In your present state of health, do you have any difficulty performing the following activities: 08/28/2018 08/06/2018  Hearing? N Y  Comment B hearing aids -  Vision? N N  Comment wears eyeglasses -  Difficulty concentrating or making decisions? N N  Walking or climbing stairs? N N  Dressing or bathing? N N  Doing errands, shopping? N N  Preparing Food and eating ? N -  Comment denies dentures -  Using the Toilet? N -  In the past six months, have you accidently leaked urine? N -  Do you have problems with loss of bowel control? N -  Managing your Medications? N -  Managing your Finances? N -  Housekeeping or managing your Housekeeping? N -  Some recent data might be hidden     Immunizations and Health Maintenance Immunization History  Administered Date(s) Administered  . Pneumococcal Conjugate-13 01/06/2014  . Pneumococcal Polysaccharide-23 04/05/2010, 08/24/2015  . Tdap 12/12/2006  . Zoster 01/06/2014   Health Maintenance Due  Topic Date Due  . MAMMOGRAM  03/20/1965  . COLONOSCOPY  05/09/2017  . FOOT EXAM  07/25/2018    Patient Care Team: Steele Sizer, MD as PCP - General (Family Medicine) [name], MD as PCP - Cardiology (Cardiology) Gavin Pound, MD as Consulting Physician (Rheumatology) Angelia Mould, MD as Consulting Physician (Vascular Surgery)  Indicate any recent Medical Services you may have received from other than [facility] providers in the past year (date may be approximate).     Assessment:   This is a routine wellness examination for [place].  Hearing/Vision screen Vision Screening Comments: Not established with a provider for annual eye exams  Dietary issues and exercise activities discussed: Current  Exercise Habits: Home exercise routine, Type of exercise: strength training/weights, Time (Minutes): 50, Frequency (Times/Week): 3, Weekly Exercise (Minutes/Week): 150, Intensity: Mild, Exercise limited by: None identified  Goals    .  DIET - INCREASE WATER INTAKE     Recommend to drink at least 6-8 8oz glasses of water per day.      Depression Screen PHQ 2/9 Scores 08/28/2018 07/07/2018 07/25/2017 03/27/2017 08/21/2016 08/24/2015 07/13/2012  PHQ - 2 Score 0 0 0 0 0 0 0  PHQ- 9 Score 0 0 - - - - -    Fall Risk Fall Risk  08/28/2018 07/07/2018 03/09/2018 07/25/2017 03/27/2017  Falls in the past year? No No No No No  Risk for fall due to : Impaired vision;History of fall(s) - - - -  Risk for fall due to: Comment wears eyeglasses - - - -   FALL RISK PREVENTION PERTAINING TO HOME: Is your home free of loose throw rugs in walkways, pet beds, electrical cords, etc? No  Is there adequate lighting in your home to reduce risk of falls? No  Are there stairs in or around your home WITH handrails? Yes   ASSISTIVE DEVICES UTILIZED TO PREVENT FALLS: Do you have a life alert? No  Use of a cane, walker or w/c? No  Grab bars in the bathroom? No  Shower chair or a place to sit while bathing? No  An elevated toilet seat or a handicapped toilet? No   Timed Get Up and Go Performed: Yes. Pt ambulated 10 feet within 14 sec. Gait stead-fast and without the use of an assistive device. Fall risk prevention has been discussed. No intervention required at this time.   DME Order and Community Resource Referral:  Does the patient want an order for a shower chair or an elevated toilet seat?  No  Does the patient want a Data processing manager Referral sent to the Care Guide for a life alert or installation of grab bars in the shower?  No   Cognitive Function: PATIENT DECLINED TO COMPLETE THIS EXAM        Screening Tests Health Maintenance  Topic Date Due  . MAMMOGRAM  03/20/1965  . COLONOSCOPY  05/09/2017  . FOOT EXAM   07/25/2018  . INFLUENZA VACCINE  10/09/2018 (Originally 07/09/2018)  . TETANUS/TDAP  08/29/2019 (Originally 12/12/2016)  . HEMOGLOBIN A1C  01/07/2019  . OPHTHALMOLOGY EXAM  03/07/2019  . DEXA SCAN  Completed  . Hepatitis C Screening  Completed  . PNA vac Low Risk Adult  Completed    Qualifies for Shingles Vaccine? Yes  Zostavax completed 01/06/14. Due for Shingrix. Education has been provided regarding the importance of this vaccine. Pt has been advised to call insurance company to determine out of pocket expense. Advised may also receive vaccine at local pharmacy or Health Dept. Verbalized acceptance and understanding.  Due for Flu vaccine. Does the patient want to receive this vaccine today?  Yes . Declined my offer to administer today. States she would prefer to wait until November to receive this vaccine. Education has been provided regarding the importance of this vaccine but still declined.   Due for Tdap vaccine. Education has been provided regarding the importance of this vaccine. Does the patient still want to receive this vaccine today?  Yes . Advised may receive this vaccine at local pharmacy or Health Dept. Aware to provide a copy of the vaccination record if obtained from local pharmacy or Health Dept. Verbalized acceptance and understanding.  Cancer Screenings: Colorectal Screening: Completed 05/09/14. Repeat every 3 years. Declined my offer to refer to GI for screening colonoscopy. States she would prefer to schedule her own appt. Mammogram: Ordered 03/09/18 but still has not yet scheduled  her appt. Pt provided with contact info and advised to call to schedule appt.  Bone Density: Completed 12/09/06. Unable to locate report. Repeat every 2 years. Declined my offer to order today. Provided no reason or rationale for declining my offer.  Lung Cancer Screening: (Low Dose CT Chest recommended if Age 47-80 years, 30 pack-year currently smoking OR have quit w/in 15years.) Does not qualify.    Additional Screening: Hepatitis C Screening: Completed 09/24/12  Dental Exams: Recommended annual dental exams for proper oral hygiene   Plan:  I have personally reviewed and addressed the Medicare Annual Wellness questionnaire and have noted the following in the patient's chart:  A. Medical and social history B. Use of alcohol, tobacco or illicit drugs  C. Current medications and supplements D. Functional ability and status E.  Nutritional status F.  Physical activity G. Advance directives H. List of other physicians I.  Hospitalizations, surgeries, and ER visits in previous 12 months J.  [place] such as hearing and vision if needed, cognitive and depression L. Referrals and appointments  In addition, I have reviewed and discussed with patient certain preventive protocols, quality metrics, and best practice recommendations. A written personalized care plan for preventive services as well as general preventive health recommendations were provided to patient.  See attached scanned questionnaire for additional information.   Signed,  Aleatha Borer, LPN Nurse Health Advisor

## 2018-09-04 ENCOUNTER — Encounter: Payer: Self-pay | Admitting: Vascular Surgery

## 2018-09-04 ENCOUNTER — Other Ambulatory Visit: Payer: Self-pay

## 2018-09-04 ENCOUNTER — Ambulatory Visit (INDEPENDENT_AMBULATORY_CARE_PROVIDER_SITE_OTHER): Payer: Self-pay | Admitting: Vascular Surgery

## 2018-09-04 VITALS — BP 102/67 | HR 65 | Temp 98.1°F | Resp 18 | Ht 62.0 in | Wt 168.7 lb

## 2018-09-04 DIAGNOSIS — I6522 Occlusion and stenosis of left carotid artery: Secondary | ICD-10-CM

## 2018-09-04 NOTE — Progress Notes (Signed)
    Subjective:     Patient ID: Bailey [name], female   DOB: October 08, 1946, 72 y.o.   MRN: [id number]  HPI Bailey [name] follows up after left carotid endarterectomy for asymptomatic high-grade stenosis.  She continues to feel weak but is neurologically okay.  She is at home where she lives alone but generally is getting along without assistance.   Review of Systems Early fatigue    Objective:   Physical Exam Awake alert oriented Neurologically intact Left neck incision is healing well    Assessment:     72 year old female status post left carotid endarterectomy for asymptomatic high-grade stenosis, she is feeling fatigued but otherwise progressing well    Plan:     Okay to resume normal activity including returning to work Follow-up in 3 months with carotid duplex  Bailey [name] C. Donzetta Matters, MD [facility] Office: [phone] Pager: [phone]

## 2018-09-09 ENCOUNTER — Ambulatory Visit: Payer: Self-pay | Admitting: Gastroenterology

## 2018-09-09 DIAGNOSIS — H6123 Impacted cerumen, bilateral: Secondary | ICD-10-CM | POA: Diagnosis not present

## 2018-09-10 ENCOUNTER — Other Ambulatory Visit: Payer: Self-pay

## 2018-09-10 DIAGNOSIS — I6522 Occlusion and stenosis of left carotid artery: Secondary | ICD-10-CM

## 2018-09-10 DIAGNOSIS — I6523 Occlusion and stenosis of bilateral carotid arteries: Secondary | ICD-10-CM

## 2018-09-17 ENCOUNTER — Ambulatory Visit: Payer: Self-pay | Admitting: Cardiovascular Disease

## 2018-10-09 ENCOUNTER — Encounter: Payer: Self-pay | Admitting: Family Medicine

## 2018-10-09 ENCOUNTER — Ambulatory Visit (INDEPENDENT_AMBULATORY_CARE_PROVIDER_SITE_OTHER): Payer: Medicare Other | Admitting: Family Medicine

## 2018-10-09 VITALS — BP 102/60 | HR 75 | Temp 97.8°F | Ht 62.0 in | Wt 167.0 lb

## 2018-10-09 DIAGNOSIS — I10 Essential (primary) hypertension: Secondary | ICD-10-CM | POA: Diagnosis not present

## 2018-10-09 DIAGNOSIS — Z9889 Other specified postprocedural states: Secondary | ICD-10-CM

## 2018-10-09 DIAGNOSIS — J4520 Mild intermittent asthma, uncomplicated: Secondary | ICD-10-CM

## 2018-10-09 DIAGNOSIS — E785 Hyperlipidemia, unspecified: Secondary | ICD-10-CM

## 2018-10-09 DIAGNOSIS — E669 Obesity, unspecified: Secondary | ICD-10-CM

## 2018-10-09 DIAGNOSIS — H9110 Presbycusis, unspecified ear: Secondary | ICD-10-CM

## 2018-10-09 DIAGNOSIS — E1169 Type 2 diabetes mellitus with other specified complication: Secondary | ICD-10-CM

## 2018-10-09 DIAGNOSIS — Z952 Presence of prosthetic heart valve: Secondary | ICD-10-CM | POA: Diagnosis not present

## 2018-10-09 DIAGNOSIS — G4733 Obstructive sleep apnea (adult) (pediatric): Secondary | ICD-10-CM

## 2018-10-09 DIAGNOSIS — I6523 Occlusion and stenosis of bilateral carotid arteries: Secondary | ICD-10-CM | POA: Diagnosis not present

## 2018-10-09 LAB — POCT GLYCOSYLATED HEMOGLOBIN (HGB A1C): HBA1C, POC (CONTROLLED DIABETIC RANGE): 5.4 % (ref 0.0–7.0)

## 2018-10-09 MED ORDER — ENALAPRIL MALEATE 10 MG PO TABS: 10.0000 mg | ORAL_TABLET | Freq: Every day | ORAL | 1 refills | Status: DC

## 2018-10-09 NOTE — Progress Notes (Signed)
Name: Bailey [name]   MRN: [id number]    DOB: 10/01/1946   Date:10/09/2018       Progress Note  Subjective  Chief Complaint  Chief Complaint  Patient presents with  . Follow-up    HPI  DMII: she has lost weight since last visit.  She has not been checking her glucose at home but denies polyphagia, polydipsia or polyuria.Last hgbA1C was 6.0%down to 5.5%,5.7%, 5.9% . She has been physically active.  She denies swelling, denies change in breathing at night, wearing CPAP. On Ace for kidney protection, dyslipidemia - she only believes in fish oil, refuses statin therapy. Discussed PKS9 and also Ezetimide but she refuses still.   Asthma Mild Intermittent: she is uses Pulmicort prn, not recently but would like a refill albuterol occasionally, she needs a refill.   HTN: taking medication and denies side effects of medication, bp is at goal. No dizziness, chest pain or palpitation.   Obesity: she is physically active, she has lost weight since last visit. She is doing well   Dyslipidemia: unable to tolerate statin therapy , she is taking fish oil only. She is aware of importance of medication, she refuses Zetia of PKS9 injection.   Hearing loss: seen by ENT, needs hearing aid but can't afford it at this time . Unchanged   Polymyalgia Rheumatica: seen by Rheumatologist , she is off prednisone now, she is worried about the weight gain and temporal pain intermittently, since that is how symptoms started again.     Patient Active Problem List   Diagnosis Date Noted  . Left carotid artery stenosis 08/05/2018  . OSA (obstructive sleep apnea) 01/20/2017  . Mild cognitive impairment 01/20/2017  . Carotid atherosclerosis, bilateral 01/20/2017  . Abnormal brain MRI 08/26/2016  . Inflammatory polyarthritis ([place]) 11/12/2015  . Erosive osteoarthritis of hand 11/12/2015  . History of vitamin D deficiency 11/06/2015  . Hearing loss 08/24/2015  . Asthma, mild intermittent 08/23/2015  .  Arthritis, degenerative 08/23/2015  . Dyslipidemia 08/23/2015  . H/O: hypothyroidism 08/23/2015  . Obesity (BMI 30-39.9) 08/23/2015  . HZV (herpes zoster virus) post herpetic neuralgia 08/23/2015  . History of renal stone 04/01/2014  . Diabetes mellitus, controlled ([place]) 04/01/2014  . Essential hypertension, benign 04/01/2014  . H/O prosthetic heart valve 06/15/2012  . CD (contact dermatitis) 07/25/2008    Past Surgical History:  Procedure Laterality Date  . AORTIC VALVE REPLACEMENT  06/2012   [facility]  . APPENDECTOMY  1958  . CHOLECYSTECTOMY  1978  . COLONOSCOPY  05-09-2014  . CYSTOSCOPY/RETROGRADE/URETEROSCOPY Right 07/04/2014   Procedure: CYSTOSCOPY/RETROGRADE/URETEROSCOPY;  Surgeon: Bernestine Amass, MD;  Location: [facility];  Service: Urology;  Laterality: Right;  . ENDARTERECTOMY Left 08/05/2018   Procedure: ENDARTERECTOMY CAROTID LEFT;  Surgeon: Waynetta Sandy, MD;  Location: [facility];  Service: Vascular;  Laterality: Left;  . ESOPHAGOGASTRODUODENOSCOPY    . EXTRACORPOREAL SHOCK WAVE LITHOTRIPSY Right 06/ 2015     [facility]  . HOLMIUM LASER APPLICATION Right 0/30/0923   Procedure: HOLMIUM LASER APPLICATION;  Surgeon: Bernestine Amass, MD;  Location: [facility];  Service: Urology;  Laterality: Right;  . PATCH ANGIOPLASTY Left 08/05/2018   Procedure: PATCH ANGIOPLASTY USING XENOSURE BIOLOGIC PATCH 1cm x 6cm;  Surgeon: Waynetta Sandy, MD;  Location: [facility];  Service: Vascular;  Laterality: Left;  . TONSILLECTOMY  1953    Family History  Problem Relation Age of Onset  . Heart disease Mother   . Heart disease Father   .  Asthma Daughter   . Asthma Maternal Grandfather   . Heart disease Sister   . Heart attack Sister 67  . Heart disease Sister   . Heart disease Sister   . Colon cancer Neg Hx     Social History   Socioeconomic History  . Marital status: Single    Spouse name: Not on file  . Number of children: 2  . Years of  education: some college  . Highest education level: 12th grade  Occupational History  . Occupation: Works for [employer]    Employer: [employer]  . Financial resource strain: Not hard at all  . Food insecurity:    Worry: Never true    Inability: Never true  . Transportation needs:    Medical: No    Non-medical: No  Tobacco Use  . Smoking status: Never Smoker  . Smokeless tobacco: Never Used  . Tobacco comment: smoking cessation materials not required  Substance and Sexual Activity  . Alcohol use: No    Alcohol/week: 0.0 standard drinks  . Drug use: No  . Sexual activity: Not Currently  Lifestyle  . Physical activity:    Days per week: 3 days    Minutes per session: 50 min  . Stress: Not at all  Relationships  . Social connections:    Talks on phone: Patient refused    Gets together: Patient refused    Attends religious service: Patient refused    Active member of club or organization: Patient refused    Attends meetings of clubs or organizations: Patient refused    Relationship status: Patient refused  . Intimate partner violence:    Fear of current or ex partner: No    Emotionally abused: No    Physically abused: No    Forced sexual activity: No  Other Topics Concern  . Not on file  Social History Narrative   Divorced, 2 children. Works as a Counsellor. Drinks 2-3 caffeinated beverages/day     Current Outpatient Medications:  .  albuterol (PROAIR HFA) 108 (90 Base) MCG/ACT inhaler, Inhale 2 puffs into the lungs every 4 (four) hours as needed for shortness of breath., Disp: 1 Inhaler, Rfl: 2 .  amoxicillin (AMOXIL) 500 MG capsule, Take 4 capsules by mouth 30 - 60 minutes prior to dental procedures, Disp: 4 capsule, Rfl: 3 .  Ascorbic Acid (VITAMIN C) 100 MG tablet, Take 100 mg by mouth daily., Disp: , Rfl:  .  aspirin EC 81 MG tablet, Take 81 mg by mouth daily., Disp: , Rfl:  .  B Complex CAPS, Take 1 tablet by mouth 2 (two) times  daily., Disp: , Rfl:  .  budesonide (PULMICORT) 0.5 MG/2ML nebulizer solution, Take 2 mLs (0.5 mg total) by nebulization as needed. (Patient taking differently: Take 0.5 mg by nebulization as needed (for wheezing). ), Disp: 120 mL, Rfl: 1 .  Cholecalciferol (VITAMIN D) 2000 UNITS tablet, Take 2,000 Units by mouth every other day. , Disp: , Rfl:  .  clopidogrel (PLAVIX) 75 MG tablet, Take 1 tablet (75 mg total) by mouth daily. (Patient taking differently: Take 75 mg by mouth at bedtime. ), Disp: 30 tablet, Rfl: 11 .  Coenzyme Q10 (COQ10) 150 MG CAPS, Take 150 mg by mouth 2 (two) times daily. , Disp: , Rfl:  .  diclofenac sodium (VOLTAREN) 1 % GEL, Apply 2 g topically 4 (four) times daily. (Patient taking differently: Apply 2 g topically daily as needed (neck pain). ), Disp:  1 Tube, Rfl: 0 .  enalapril (VASOTEC) 10 MG tablet, TAKE 1 TABLET(10 MG) BY MOUTH DAILY (Patient taking differently: Take 10 mg by mouth at bedtime. ), Disp: 90 tablet, Rfl: 1 .  glycerin adult 2 g suppository, Place 1 suppository rectally as needed for constipation., Disp: 12 suppository, Rfl: 0 .  Krill Oil 1000 MG CAPS, Take 1 capsule by mouth daily., Disp: , Rfl:  .  MAG THREONATE-NIACINAMIDE ER PO, Take 800 mg by mouth 2 (two) times daily., Disp: , Rfl:  .  metFORMIN (GLUCOPHAGE) 500 MG tablet, Take 1 tablet (500 mg total) by mouth 2 (two) times daily with a meal. (Patient taking differently: Take 500 mg by mouth daily as needed (blood sugar higher than 120). ), Disp: 180 tablet, Rfl: 0 .  mupirocin ointment (BACTROBAN) 2 %, Place 1 application into the nose as needed. Patient started 08/04/18 completed 3 doses will need 7 more , Disp: , Rfl:  .  Probiotic Product (PROBIOTIC DAILY PO), Take 1 capsule by mouth daily., Disp: , Rfl:  .  polyethylene glycol (MIRALAX / GLYCOLAX) packet, Take 17 g by mouth daily. (Patient not taking: Reported on 10/09/2018), Disp: 14 each, Rfl: 0  Allergies  Allergen Reactions  . Dilaudid  [Hydromorphone Hcl] Other (See Comments)    Severe hypotension - pt states that if she is given this medication it will kill her  . Naproxen Shortness Of Breath  . Nsaids Shortness Of Breath and Other (See Comments)    Causes asthma  . Percocet [Oxycodone-Acetaminophen] Shortness Of Breath    Can tolerate oxy IR  . Latex Hives  . Other Swelling    Eye drop preservative Specific agent unspecified  . Rosuvastatin Other (See Comments)    myalgia  . Morphine And Related     Pain with IM injection    I personally reviewed active problem list, medication list, allergies, family history, social history with the patient/caregiver today.   ROS  Constitutional: Negative for fever or positive for weight change.  Respiratory: Negative for cough and shortness of breath.   Cardiovascular: Negative for chest pain or palpitations.  Gastrointestinal: Negative for abdominal pain, no bowel changes.  Musculoskeletal: Negative for gait problem or joint swelling.  Skin: Negative for rash.  Neurological: Negative for dizziness or headache.  No other specific complaints in a complete review of systems (except as listed in HPI above).  Objective  Vitals:   10/09/18 1034  BP: 102/60  Pulse: 75  Temp: 97.8 F (36.6 C)  SpO2: 94%  Weight: 167 lb (75.8 kg)  Height: 5' 2" (1.575 m)    Body mass index is 30.54 kg/m.  Physical Exam  Constitutional: Patient appears well-developed and well-nourished. Obese No distress.  HEENT: head atraumatic, normocephalic, pupils equal and reactive to light, neck supple, throat within normal limits, neck scar on left side healing well from endarterectomy Cardiovascular: Normal rate, regular rhythm and normal heart sounds.  2/6 SE murmur heard. Trace  BLE edema. Pulmonary/Chest: Effort normal and breath sounds normal. No respiratory distress. Abdominal: Soft.  There is no tenderness. Psychiatric: Patient has a normal mood and affect. behavior is normal. Judgment  and thought content normal.  Recent Results (from the past 2160 hour(s))  Glucose, capillary     Status: None   Collection Time: 08/04/18  8:20 AM  Result Value Ref Range   Glucose-Capillary 74 70 - 99 mg/dL  APTT     Status: None   Collection Time: 08/04/18  8:55  AM  Result Value Ref Range   aPTT 30 24 - 36 seconds    Comment: Performed at [facility], [place] [address], [place] [address]  CBC     Status: None   Collection Time: 08/04/18  8:55 AM  Result Value Ref Range   WBC 5.7 4.0 - 10.5 K/uL   RBC 4.53 3.87 - 5.11 MIL/uL   Hemoglobin 13.0 12.0 - 15.0 g/dL   HCT 40.6 36.0 - 46.0 %   MCV 89.6 78.0 - 100.0 fL   MCH 28.7 26.0 - 34.0 pg   MCHC 32.0 30.0 - 36.0 g/dL   RDW 14.0 11.5 - 15.5 %   Platelets 181 150 - 400 K/uL    Comment: Performed at [facility], [place] [address] [place], [place] [address]  Comprehensive metabolic panel     Status: Abnormal   Collection Time: 08/04/18  8:55 AM  Result Value Ref Range   Sodium 137 135 - 145 mmol/L   Potassium 5.2 (H) 3.5 - 5.1 mmol/L    Comment: SPECIMEN HEMOLYZED. HEMOLYSIS MAY AFFECT INTEGRITY OF RESULTS.   Chloride 104 98 - 111 mmol/L   CO2 23 22 - 32 mmol/L   Glucose, Bld 105 (H) 70 - 99 mg/dL   BUN 15 8 - 23 mg/dL   Creatinine, Ser 0.58 0.44 - 1.00 mg/dL   Calcium 9.4 8.9 - 10.3 mg/dL   Total Protein 6.6 6.5 - 8.1 g/dL   Albumin 3.8 3.5 - 5.0 g/dL   AST 45 (H) 15 - 41 U/L   ALT <5 0 - 44 U/L    Comment: REPEATED TO VERIFY SPECIMEN HEMOLYZED. HEMOLYSIS MAY AFFECT INTEGRITY OF RESULTS.    Alkaline Phosphatase 35 (L) 38 - 126 U/L   Total Bilirubin 1.7 (H) 0.3 - 1.2 mg/dL   GFR calc non Af Amer >60 >60 mL/min   GFR calc Af Amer >60 >60 mL/min    Comment: (NOTE) The eGFR has been calculated using the CKD EPI equation. This calculation has not been validated in all clinical situations. eGFR's persistently <60 mL/min signify possible Chronic Kidney Disease.    Anion gap 10 5 - 15    Comment: Performed  at [facility] [address], [place], [place] [address]  Protime-INR     Status: None   Collection Time: 08/04/18  8:55 AM  Result Value Ref Range   Prothrombin Time 12.7 11.4 - 15.2 seconds   INR 0.96     Comment: Performed at [facility] [address] [place], [place] [address]  Blood gas, arterial     Status: Abnormal   Collection Time: 08/04/18  8:56 AM  Result Value Ref Range   FIO2 21.00    pH, Arterial 7.435 7.350 - 7.450   pCO2 arterial 37.5 32.0 - 48.0 mmHg   pO2, Arterial 132 (H) 83.0 - 108.0 mmHg   Bicarbonate 24.7 20.0 - 28.0 mmol/L   Acid-Base Excess 0.9 0.0 - 2.0 mmol/L   O2 Saturation 98.5 %   Patient temperature 98.6    Collection site RIGHT BRACHIAL    Drawn by [id number]    Sample type ARTERIAL   Type and screen     Status: None   Collection Time: 08/04/18  8:57 AM  Result Value Ref Range   ABO/RH(D) A POS    Antibody Screen NEG    Sample Expiration 08/18/2018    Extend sample reason  NO TRANSFUSIONS OR PREGNANCY IN THE PAST 3 MONTHS Performed at [facility], [place] [address], [place], [place] [address]   Urinalysis, Routine w reflex microscopic     Status: Abnormal   Collection Time: 08/04/18  8:57 AM  Result Value Ref Range   Color, Urine YELLOW YELLOW   APPearance CLEAR CLEAR   Specific Gravity, Urine 1.018 1.005 - 1.030   pH 5.0 5.0 - 8.0   Glucose, UA NEGATIVE NEGATIVE mg/dL   Hgb urine dipstick NEGATIVE NEGATIVE   Bilirubin Urine NEGATIVE NEGATIVE   Ketones, ur NEGATIVE NEGATIVE mg/dL   Protein, ur NEGATIVE NEGATIVE mg/dL   Nitrite NEGATIVE NEGATIVE   Leukocytes, UA NEGATIVE NEGATIVE   RBC / HPF 0-5 0 - 5 RBC/hpf   WBC, UA 0-5 0 - 5 WBC/hpf   Bacteria, UA RARE (A) NONE SEEN   Squamous Epithelial / LPF 0-5 0 - 5   Mucus PRESENT    Hyaline Casts, UA PRESENT     Comment: Performed at [facility], [address], [place], [place] [address]  Surgical pcr screen     Status: Abnormal   Collection Time: 08/04/18   8:57 AM  Result Value Ref Range   MRSA, PCR NEGATIVE NEGATIVE   Staphylococcus aureus POSITIVE (A) NEGATIVE    Comment: (NOTE) The Xpert SA Assay (FDA approved for NASAL specimens in patients 49 years of age and older), is one component of a comprehensive surveillance program. It is not intended to diagnose infection nor to guide or monitor treatment. Performed at [facility], [place] [address] [place], [place] [address]   ABO/Rh     Status: None   Collection Time: 08/04/18  9:15 AM  Result Value Ref Range   ABO/RH(D)      A POS Performed at [facility] [address] [place], [place] [address]   Glucose, capillary     Status: Abnormal   Collection Time: 08/05/18  6:16 AM  Result Value Ref Range   Glucose-Capillary 107 (H) 70 - 99 mg/dL   Comment 1 Notify RN    Comment 2 Document in Chart   POCT Activated clotting time     Status: None   Collection Time: 08/05/18  9:12 AM  Result Value Ref Range   Activated Clotting Time 263 seconds  Glucose, capillary     Status: Abnormal   Collection Time: 08/05/18 11:12 AM  Result Value Ref Range   Glucose-Capillary 118 (H) 70 - 99 mg/dL   Comment 1 Notify RN   MRSA PCR Screening     Status: None   Collection Time: 08/05/18  2:17 PM  Result Value Ref Range   MRSA by PCR NEGATIVE NEGATIVE    Comment:        The GeneXpert MRSA Assay (FDA approved for NASAL specimens only), is one component of a comprehensive MRSA colonization surveillance program. It is not intended to diagnose MRSA infection nor to guide or monitor treatment for MRSA infections. Performed at [facility], [place] [address], [place], [place] [address]   Glucose, capillary     Status: Abnormal   Collection Time: 08/05/18  4:08 PM  Result Value Ref Range   Glucose-Capillary 168 (H) 70 - 99 mg/dL   Comment 1 Capillary Specimen   Glucose, capillary     Status: Abnormal   Collection Time: 08/05/18  9:53 PM  Result Value Ref Range    Glucose-Capillary 137 (H) 70 - 99  mg/dL   Comment 1 Capillary Specimen   CBC     Status: Abnormal   Collection Time: 08/06/18  4:03 AM  Result Value Ref Range   WBC 9.7 4.0 - 10.5 K/uL   RBC 3.64 (L) 3.87 - 5.11 MIL/uL   Hemoglobin 10.6 (L) 12.0 - 15.0 g/dL   HCT 32.7 (L) 36.0 - 46.0 %   MCV 89.8 78.0 - 100.0 fL   MCH 29.1 26.0 - 34.0 pg   MCHC 32.4 30.0 - 36.0 g/dL   RDW 14.0 11.5 - 15.5 %   Platelets 177 150 - 400 K/uL    Comment: Performed at [facility], [place] [address] [place], [place] [address]  Basic metabolic panel     Status: Abnormal   Collection Time: 08/06/18  4:03 AM  Result Value Ref Range   Sodium 140 135 - 145 mmol/L   Potassium 4.2 3.5 - 5.1 mmol/L    Comment: DELTA CHECK NOTED   Chloride 108 98 - 111 mmol/L   CO2 26 22 - 32 mmol/L   Glucose, Bld 127 (H) 70 - 99 mg/dL   BUN 9 8 - 23 mg/dL   Creatinine, Ser 0.52 0.44 - 1.00 mg/dL   Calcium 8.9 8.9 - 10.3 mg/dL   GFR calc non Af Amer >60 >60 mL/min   GFR calc Af Amer >60 >60 mL/min    Comment: (NOTE) The eGFR has been calculated using the CKD EPI equation. This calculation has not been validated in all clinical situations. eGFR's persistently <60 mL/min signify possible Chronic Kidney Disease.    Anion gap 6 5 - 15    Comment: Performed at [facility] [address] [place], [place] [address]  Glucose, capillary     Status: Abnormal   Collection Time: 08/06/18  6:45 AM  Result Value Ref Range   Glucose-Capillary 115 (H) 70 - 99 mg/dL   Comment 1 Capillary Specimen   Glucose, capillary     Status: Abnormal   Collection Time: 08/06/18 11:54 AM  Result Value Ref Range   Glucose-Capillary 108 (H) 70 - 99 mg/dL   Comment 1 Capillary Specimen   Glucose, capillary     Status: Abnormal   Collection Time: 08/06/18  4:11 PM  Result Value Ref Range   Glucose-Capillary 110 (H) 70 - 99 mg/dL   Comment 1 Capillary Specimen   Glucose, capillary     Status: Abnormal   Collection Time: 08/06/18 10:04  PM  Result Value Ref Range   Glucose-Capillary 110 (H) 70 - 99 mg/dL   Comment 1 Capillary Specimen   Glucose, capillary     Status: None   Collection Time: 08/07/18  6:55 AM  Result Value Ref Range   Glucose-Capillary 99 70 - 99 mg/dL   Comment 1 Capillary Specimen   CBC     Status: Abnormal   Collection Time: 08/07/18  8:43 AM  Result Value Ref Range   WBC 7.6 4.0 - 10.5 K/uL   RBC 3.41 (L) 3.87 - 5.11 MIL/uL   Hemoglobin 9.9 (L) 12.0 - 15.0 g/dL   HCT 31.3 (L) 36.0 - 46.0 %   MCV 91.8 78.0 - 100.0 fL   MCH 29.0 26.0 - 34.0 pg   MCHC 31.6 30.0 - 36.0 g/dL   RDW 13.9 11.5 - 15.5 %   Platelets 150 150 - 400 K/uL    Comment: Performed at [facility], [place]. [address] [place], [place] [address]  Creatinine,  serum     Status: None   Collection Time: 08/07/18  8:43 AM  Result Value Ref Range   Creatinine, Ser 0.58 0.44 - 1.00 mg/dL   GFR calc non Af Amer >60 >60 mL/min   GFR calc Af Amer >60 >60 mL/min    Comment: (NOTE) The eGFR has been calculated using the CKD EPI equation. This calculation has not been validated in all clinical situations. eGFR's persistently <60 mL/min signify possible Chronic Kidney Disease. Performed at [facility], [place] [address], [place], [place] [address]   Glucose, capillary     Status: None   Collection Time: 08/07/18 11:11 AM  Result Value Ref Range   Glucose-Capillary 75 70 - 99 mg/dL   Comment 1 Notify RN   Glucose, capillary     Status: Abnormal   Collection Time: 08/07/18  3:59 PM  Result Value Ref Range   Glucose-Capillary 67 (L) 70 - 99 mg/dL   Comment 1 Notify RN   Glucose, capillary     Status: None   Collection Time: 08/07/18  6:24 PM  Result Value Ref Range   Glucose-Capillary 82 70 - 99 mg/dL   Comment 1 Capillary Specimen   Glucose, capillary     Status: Abnormal   Collection Time: 08/07/18  9:05 PM  Result Value Ref Range   Glucose-Capillary 114 (H) 70 - 99 mg/dL   Comment 1 Notify RN   Glucose, capillary      Status: None   Collection Time: 08/08/18  6:48 AM  Result Value Ref Range   Glucose-Capillary 76 70 - 99 mg/dL   Comment 1 Notify RN   Glucose, capillary     Status: Abnormal   Collection Time: 08/08/18 11:05 AM  Result Value Ref Range   Glucose-Capillary 67 (L) 70 - 99 mg/dL   Comment 1 Capillary Specimen    Comment 2 Notify RN   Glucose, capillary     Status: None   Collection Time: 08/08/18  3:10 PM  Result Value Ref Range   Glucose-Capillary 77 70 - 99 mg/dL   Comment 1 Capillary Specimen    Comment 2 Notify RN   Glucose, capillary     Status: Abnormal   Collection Time: 08/08/18  9:42 PM  Result Value Ref Range   Glucose-Capillary 106 (H) 70 - 99 mg/dL   Comment 1 Capillary Specimen   Glucose, capillary     Status: None   Collection Time: 08/09/18  7:00 AM  Result Value Ref Range   Glucose-Capillary 77 70 - 99 mg/dL   Comment 1 Capillary Specimen   Glucose, capillary     Status: None   Collection Time: 08/09/18 11:20 AM  Result Value Ref Range   Glucose-Capillary 77 70 - 99 mg/dL   Comment 1 Capillary Specimen   Glucose, capillary     Status: None   Collection Time: 08/09/18  4:27 PM  Result Value Ref Range   Glucose-Capillary 81 70 - 99 mg/dL   Comment 1 Capillary Specimen   Glucose, capillary     Status: Abnormal   Collection Time: 08/09/18  9:29 PM  Result Value Ref Range   Glucose-Capillary 111 (H) 70 - 99 mg/dL   Comment 1 Notify RN    Comment 2 Document in Chart   Glucose, capillary     Status: Abnormal   Collection Time: 08/10/18 12:21 AM  Result Value Ref Range   Glucose-Capillary 112 (H) 70 - 99 mg/dL   Comment 1 Notify  RN    Comment 2 Document in Chart   Glucose, capillary     Status: Abnormal   Collection Time: 08/10/18  6:30 AM  Result Value Ref Range   Glucose-Capillary 110 (H) 70 - 99 mg/dL  CBC     Status: Abnormal   Collection Time: 08/12/18  8:23 PM  Result Value Ref Range   WBC 8.1 4.0 - 10.5 K/uL   RBC 3.53 (L) 3.87 - 5.11 MIL/uL    Hemoglobin 10.1 (L) 12.0 - 15.0 g/dL   HCT 32.3 (L) 36.0 - 46.0 %   MCV 91.5 78.0 - 100.0 fL   MCH 28.6 26.0 - 34.0 pg   MCHC 31.3 30.0 - 36.0 g/dL   RDW 14.3 11.5 - 15.5 %   Platelets 259 150 - 400 K/uL    Comment: Performed at [facility], [place]. [address], [place], [place] [address]  Basic metabolic panel     Status: None   Collection Time: 08/12/18  8:23 PM  Result Value Ref Range   Sodium 144 135 - 145 mmol/L   Potassium 3.9 3.5 - 5.1 mmol/L   Chloride 107 98 - 111 mmol/L   CO2 25 22 - 32 mmol/L   Glucose, Bld 97 70 - 99 mg/dL   BUN 10 8 - 23 mg/dL   Creatinine, Ser 0.64 0.44 - 1.00 mg/dL   Calcium 9.6 8.9 - 10.3 mg/dL   GFR calc non Af Amer >60 >60 mL/min   GFR calc Af Amer >60 >60 mL/min    Comment: (NOTE) The eGFR has been calculated using the CKD EPI equation. This calculation has not been validated in all clinical situations. eGFR's persistently <60 mL/min signify possible Chronic Kidney Disease.    Anion gap 12 5 - 15    Comment: Performed at [facility] [address], [place], [place] [address]  I-Stat CG4 Lactic Acid, ED     Status: None   Collection Time: 08/12/18  8:42 PM  Result Value Ref Range   Lactic Acid, Venous 0.96 0.5 - 1.9 mmol/L  I-stat troponin, ED     Status: None   Collection Time: 08/12/18 11:12 PM  Result Value Ref Range   Troponin i, poc 0.03 0.00 - 0.08 ng/mL   Comment 3            Comment: Due to the release kinetics of cTnI, a negative result within the first hours of the onset of symptoms does not rule out myocardial infarction with certainty. If myocardial infarction is still suspected, repeat the test at appropriate intervals.   I-Stat CG4 Lactic Acid, ED     Status: None   Collection Time: 08/12/18 11:14 PM  Result Value Ref Range   Lactic Acid, Venous 0.80 0.5 - 1.9 mmol/L    Diabetic Foot Exam: Diabetic Foot Exam - Simple   Simple Foot Form Diabetic Foot exam was performed with the following findings:  Yes  10/09/2018 10:55 AM  Visual Inspection No deformities, no ulcerations, no other skin breakdown bilaterally:  Yes Sensation Testing Intact to touch and monofilament testing bilaterally:  Yes Pulse Check Posterior Tibialis and Dorsalis pulse intact bilaterally:  Yes Comments      PHQ2/9: Depression screen [facility] 2/9 10/09/2018 08/28/2018 07/07/2018 07/25/2017 03/27/2017  Decreased Interest 0 0 0 0 0  Down, Depressed, Hopeless 0 0 0 0 0  PHQ - 2 Score 0 0 0 0 0  Altered sleeping 0 0 0 - -  Tired,  decreased energy 0 0 0 - -  Change in appetite 0 0 0 - -  Feeling bad or failure about yourself  0 0 0 - -  Trouble concentrating 0 0 0 - -  Moving slowly or fidgety/restless 0 0 0 - -  Suicidal thoughts 0 0 0 - -  PHQ-9 Score 0 0 0 - -  Difficult doing work/chores Not difficult at all Not difficult at all Not difficult at all - -     Fall Risk: Fall Risk  10/09/2018 08/28/2018 07/07/2018 03/09/2018 07/25/2017  Falls in the past year? 0 No No No No  Risk for fall due to : - Impaired vision;History of fall(s) - - -  Risk for fall due to: Comment - wears eyeglasses - - -     Functional Status Survey: Is the patient deaf or have difficulty hearing?: No Does the patient have difficulty seeing, even when wearing glasses/contacts?: No Does the patient have difficulty concentrating, remembering, or making decisions?: No Does the patient have difficulty walking or climbing stairs?: No Does the patient have difficulty dressing or bathing?: No Does the patient have difficulty doing errands alone such as visiting a doctor's office or shopping?: No   Assessment & Plan  1. Hypertension, benign  - enalapril (VASOTEC) 10 MG tablet; Take 1 tablet (10 mg total) by mouth at bedtime.  Dispense: 90 tablet; Refill: 1  2. Diabetes mellitus type 2 in obese (HCC)  - POCT glycosylated hemoglobin (Hb A1C)  3. Presbycusis, unspecified laterality  Cannot afford hearing aid   4. Dyslipidemia  Refuses  medication   5. Mild intermittent asthma without complication  She will fill rx at pharmacy   6. H/O prosthetic heart valve   7. Obesity (BMI 30-39.9)  He lost weight since last visit   8. OSA (obstructive sleep apnea)   9. Carotid atherosclerosis, bilateral   10. History of left-sided carotid endarterectomy  On plavix but worried about side effects, advised to discuss with vascular surgeon

## 2018-10-10 LAB — MICROALBUMIN / CREATININE URINE RATIO: CREATININE, URINE: 94 mg/dL (ref 20–275)

## 2018-10-20 ENCOUNTER — Ambulatory Visit (INDEPENDENT_AMBULATORY_CARE_PROVIDER_SITE_OTHER): Payer: Medicare Other | Admitting: Gastroenterology

## 2018-10-20 ENCOUNTER — Encounter: Payer: Self-pay | Admitting: Gastroenterology

## 2018-10-20 VITALS — BP 110/70 | HR 72 | Ht 62.0 in | Wt 165.0 lb

## 2018-10-20 DIAGNOSIS — Z86010 Personal history of colonic polyps: Secondary | ICD-10-CM | POA: Diagnosis not present

## 2018-10-20 DIAGNOSIS — I6523 Occlusion and stenosis of bilateral carotid arteries: Secondary | ICD-10-CM | POA: Diagnosis not present

## 2018-10-20 NOTE — Patient Instructions (Signed)
Normal BMI (Body Mass Index- based on height and weight) is between 23 and 30. Your BMI today is Body mass index is 30.18 kg/m. Marland Kitchen Please consider follow up  regarding your BMI with your Primary Care Provider.  Thank you for entrusting me with your care and choosing [facility].  Dr Ardis Hughs

## 2018-10-20 NOTE — Progress Notes (Signed)
Review of pertinent gastrointestinal problems: 1.  Precancerous colon polyps.  Colonoscopy 2015, Dr. Ardis Hughs found 3 subcentimeter adenomas, recommended to have repeat colonoscopy at 3-year interval.  Colonoscopy 2012 Dr. Ardis Hughs found 3 subcentimeter adenomas.  Previous colonoscopies by outside gastroenterologist also found precancerous colon polyps.   HPI: This is a very pleasant 72 year old woman whom I last saw at the time of a colonoscopy about 4 years ago.  Chief complaint is history of precancerous colon polyps  Echo 2018 EF 60-65%  Recent Left CEA Dr. Donzetta Matters  She was put on Plavix by her vascular surgeon before the surgery and she has remained on it since then.  She is under the impression that it may be stopped in another month or 2.  She has no GI issues.  Specifically no bleeding, no significant constipation or diarrhea.     Review of systems: Pertinent positive and negative review of systems were noted in the above HPI section. All other review negative.   Past Medical History:  Diagnosis Date  . Arthritis   . Asthma   . B12 deficiency anemia   . H/O aortic valve replacement 2013   at [facility]   . History of colon polyps   . History of kidney stones   . HTN (hypertension)   . Hyperlipidemia   . Left nephrolithiasis   . OSA (obstructive sleep apnea)    STUDY 12 YRS CPAP RX-- BUT NONTOLERANT  . Right ureteral stone   . S/P aortic valve replacement    2013  AT [facility]--- CARDIOLOGIST--  DR CRAWFORD AT [facility]  . Seasonal allergies   . Statin intolerance   . Type 2 diabetes mellitus ([place])    type 2    Past Surgical History:  Procedure Laterality Date  . AORTIC VALVE REPLACEMENT  06/2012   [facility]  . APPENDECTOMY  1958  . CHOLECYSTECTOMY  1978  . COLONOSCOPY  05-09-2014  . CYSTOSCOPY/RETROGRADE/URETEROSCOPY Right 07/04/2014   Procedure: CYSTOSCOPY/RETROGRADE/URETEROSCOPY;  Surgeon: Bernestine Amass, MD;  Location: [facility];  Service: Urology;  Laterality:  Right;  . ENDARTERECTOMY Left 08/05/2018   Procedure: ENDARTERECTOMY CAROTID LEFT;  Surgeon: Waynetta Sandy, MD;  Location: [place];  Service: Vascular;  Laterality: Left;  . ESOPHAGOGASTRODUODENOSCOPY    . EXTRACORPOREAL SHOCK WAVE LITHOTRIPSY Right 06/ 2015     [place]  . HOLMIUM LASER APPLICATION Right 0/24/0973   Procedure: HOLMIUM LASER APPLICATION;  Surgeon: Bernestine Amass, MD;  Location: [facility];  Service: Urology;  Laterality: Right;  . PATCH ANGIOPLASTY Left 08/05/2018   Procedure: PATCH ANGIOPLASTY USING XENOSURE BIOLOGIC PATCH 1cm x 6cm;  Surgeon: Waynetta Sandy, MD;  Location: [place];  Service: Vascular;  Laterality: Left;  . TONSILLECTOMY  1953    Current Outpatient Medications  Medication Sig Dispense Refill  . albuterol (PROAIR HFA) 108 (90 Base) MCG/ACT inhaler Inhale 2 puffs into the lungs every 4 (four) hours as needed for shortness of breath. 1 Inhaler 2  . amoxicillin (AMOXIL) 500 MG capsule Take 4 capsules by mouth 30 - 60 minutes prior to dental procedures 4 capsule 3  . Ascorbic Acid (VITAMIN C) 100 MG tablet Take 100 mg by mouth daily.    Marland Kitchen aspirin EC 81 MG tablet Take 81 mg by mouth daily.    . B Complex CAPS Take 1 tablet by mouth 2 (two) times daily.    . budesonide (PULMICORT) 0.5 MG/2ML nebulizer solution Take 2 mLs (0.5 mg total) by nebulization as  needed. (Patient taking differently: Take 0.5 mg by nebulization as needed (for wheezing). ) 120 mL 1  . Cholecalciferol (VITAMIN D) 2000 UNITS tablet Take 2,000 Units by mouth every other day.     . clopidogrel (PLAVIX) 75 MG tablet Take 1 tablet (75 mg total) by mouth daily. (Patient taking differently: Take 75 mg by mouth at bedtime. ) 30 tablet 11  . Coenzyme Q10 (COQ10) 150 MG CAPS Take 150 mg by mouth 2 (two) times daily.     . diclofenac sodium (VOLTAREN) 1 % GEL Apply 2 g topically 4 (four) times daily. (Patient taking differently: Apply 2 g topically daily as needed (neck  pain). ) 1 Tube 0  . enalapril (VASOTEC) 10 MG tablet Take 1 tablet (10 mg total) by mouth at bedtime. 90 tablet 1  . glycerin adult 2 g suppository Place 1 suppository rectally as needed for constipation. 12 suppository 0  . Krill Oil 1000 MG CAPS Take 1 capsule by mouth daily.    Marland Kitchen MAG THREONATE-NIACINAMIDE ER PO Take 800 mg by mouth 2 (two) times daily.    . metFORMIN (GLUCOPHAGE) 500 MG tablet Take 1 tablet (500 mg total) by mouth 2 (two) times daily with a meal. (Patient taking differently: Take 500 mg by mouth daily as needed (blood sugar higher than 120). ) 180 tablet 0  . Probiotic Product (PROBIOTIC DAILY PO) Take 1 capsule by mouth daily.     No current facility-administered medications for this visit.     Allergies as of 10/20/2018 - Review Complete 10/20/2018  Allergen Reaction Noted  . Dilaudid [hydromorphone hcl] Other (See Comments) 07/09/2012  . Naproxen Shortness Of Breath 07/09/2012  . Nsaids Shortness Of Breath and Other (See Comments) 06/03/2012  . Percocet [oxycodone-acetaminophen] Shortness Of Breath 07/09/2012  . Latex Hives 07/09/2012  . Other Swelling 12/23/2011  . Rosuvastatin Other (See Comments) 08/23/2015  . Morphine and related  08/12/2018    Family History  Problem Relation Age of Onset  . Heart disease Mother   . Heart disease Father   . Asthma Daughter   . Asthma Maternal Grandfather   . Heart disease Sister        smoker  . Heart attack Sister 48  . Kidney cancer Sister   . Heart disease Sister        smoker  . Heart disease Sister        smoker  . Stomach cancer Maternal Grandmother   . Colon cancer Neg Hx   . Esophageal cancer Neg Hx   . Rectal cancer Neg Hx     Social History   Socioeconomic History  . Marital status: Divorced    Spouse name: Not on file  . Number of children: 2  . Years of education: some college  . Highest education level: 12th grade  Occupational History  . Occupation: Works for [employer]-retired     Employer: [employer]  . Financial resource strain: Not hard at all  . Food insecurity:    Worry: Never true    Inability: Never true  . Transportation needs:    Medical: No    Non-medical: No  Tobacco Use  . Smoking status: Never Smoker  . Smokeless tobacco: Never Used  . Tobacco comment: smoking cessation materials not required  Substance and Sexual Activity  . Alcohol use: No    Alcohol/week: 0.0 standard drinks  . Drug use: No  . Sexual activity: Not Currently  Lifestyle  .  Physical activity:    Days per week: 3 days    Minutes per session: 50 min  . Stress: Not at all  Relationships  . Social connections:    Talks on phone: More than three times a week    Gets together: More than three times a week    Attends religious service: More than 4 times per year    Active member of club or organization: Yes    Attends meetings of clubs or organizations: More than 4 times per year    Relationship status: Divorced  . Intimate partner violence:    Fear of current or ex partner: No    Emotionally abused: No    Physically abused: No    Forced sexual activity: No  Other Topics Concern  . Not on file  Social History Narrative   Divorced, 2 children. No longer working      Physical Exam: BP 110/70   Pulse 72   Ht 5\' 2"  (1.575 m)   Wt 165 lb (74.8 kg)   BMI 30.18 kg/m  Constitutional: generally well-appearing Psychiatric: alert and oriented x3 Eyes: extraocular movements intact Mouth: oral pharynx moist, no lesions Neck: supple no lymphadenopathy Cardiovascular: heart regular rate and rhythm Lungs: clear to auscultation bilaterally Abdomen: soft, nontender, nondistended, no obvious ascites, no peritoneal signs, normal bowel sounds Extremities: no lower extremity edema bilaterally Skin: no lesions on visible extremities   Assessment and plan: 72 y.o. female with history of precancerous colon polyps  She is on Plavix, this was started by her  vascular surgeon around the time of carotid endarterectomy about 2 months ago.  She is under the impression that Plavix will be stopped in the next month or 2.  I will reach out to her vascular surgeon to discuss his plan for her Plavix and we will decide on timing of colonoscopy after I hear back.    Please see the "Patient Instructions" section for addition details about the plan.   Owens Loffler, MD [facility] 10/20/2018, 10:31 AM  Cc: Steele Sizer, MD

## 2018-11-02 ENCOUNTER — Telehealth: Payer: Self-pay | Admitting: *Deleted

## 2018-11-02 NOTE — Telephone Encounter (Signed)
Patient called to report that she is no longer taking PLAVIX.

## 2018-11-19 ENCOUNTER — Encounter: Payer: Self-pay | Admitting: Gastroenterology

## 2018-12-14 ENCOUNTER — Ambulatory Visit (INDEPENDENT_AMBULATORY_CARE_PROVIDER_SITE_OTHER): Payer: Medicare Other | Admitting: Cardiovascular Disease

## 2018-12-14 ENCOUNTER — Encounter

## 2018-12-14 ENCOUNTER — Encounter: Payer: Self-pay | Admitting: Cardiovascular Disease

## 2018-12-14 VITALS — BP 112/70 | HR 64 | Ht 62.0 in | Wt 172.0 lb

## 2018-12-14 DIAGNOSIS — I6523 Occlusion and stenosis of bilateral carotid arteries: Secondary | ICD-10-CM | POA: Diagnosis not present

## 2018-12-14 DIAGNOSIS — I350 Nonrheumatic aortic (valve) stenosis: Secondary | ICD-10-CM

## 2018-12-14 DIAGNOSIS — I10 Essential (primary) hypertension: Secondary | ICD-10-CM

## 2018-12-14 DIAGNOSIS — E785 Hyperlipidemia, unspecified: Secondary | ICD-10-CM

## 2018-12-14 NOTE — Progress Notes (Signed)
Chief Complaint  Patient presents with  . Follow-up    CAD    History of Present Illness: 73 yo female with history of HTN, HLD, DM, sleep apnea, aortic stenosis s/p AVR and carotid artery stenosis who is here today for cardiac follow up. She underwent AVR at [facility] in July  2013 with placement of a 21 mm bioprosthetic valve.  She had been followed at [facility] by Dr. Sharlet Salina until 2018 when she established in our office. Cardiac cath in 2013 with no evidence of CAD. Echo August 2019 with LVEF=55-60%, mild to moderate AS with mean gradient 22 mmHg. Her carotid artery disease is followed in the [facility] office. She underwent left carotid endarterectomy in August 2019 per Dr. Donzetta Matters.   She is here today for follow up. The patient denies any chest pain, dyspnea, palpitations, lower extremity edema, orthopnea, PND, dizziness, near syncope or syncope.    Primary Care Physician: Steele Sizer, MD  Past Medical History:  Diagnosis Date  . Arthritis   . Asthma   . B12 deficiency anemia   . H/O aortic valve replacement 2013   at [facility]   . History of colon polyps   . History of kidney stones   . HTN (hypertension)   . Hyperlipidemia   . Left nephrolithiasis   . OSA (obstructive sleep apnea)    STUDY 12 YRS CPAP RX-- BUT NONTOLERANT  . Right ureteral stone   . S/P aortic valve replacement    2013  AT [facility]--- CARDIOLOGIST--  DR CRAWFORD AT [facility]  . Seasonal allergies   . Statin intolerance   . Type 2 diabetes mellitus ([place])    type 2    Past Surgical History:  Procedure Laterality Date  . AORTIC VALVE REPLACEMENT  06/2012   [facility]  . APPENDECTOMY  1958  . CHOLECYSTECTOMY  1978  . COLONOSCOPY  05-09-2014  . CYSTOSCOPY/RETROGRADE/URETEROSCOPY Right 07/04/2014   Procedure: CYSTOSCOPY/RETROGRADE/URETEROSCOPY;  Surgeon: Bernestine Amass, MD;  Location: [facility];  Service: Urology;  Laterality: Right;  . ENDARTERECTOMY Left 08/05/2018   Procedure: ENDARTERECTOMY CAROTID LEFT;  Surgeon:  Waynetta Sandy, MD;  Location: [place];  Service: Vascular;  Laterality: Left;  . ESOPHAGOGASTRODUODENOSCOPY    . EXTRACORPOREAL SHOCK WAVE LITHOTRIPSY Right 06/ 2015     [place]  . HOLMIUM LASER APPLICATION Right 7/61/9509   Procedure: HOLMIUM LASER APPLICATION;  Surgeon: Bernestine Amass, MD;  Location: [facility];  Service: Urology;  Laterality: Right;  . PATCH ANGIOPLASTY Left 08/05/2018   Procedure: PATCH ANGIOPLASTY USING XENOSURE BIOLOGIC PATCH 1cm x 6cm;  Surgeon: Waynetta Sandy, MD;  Location: [place];  Service: Vascular;  Laterality: Left;  . TONSILLECTOMY  1953    Current Outpatient Medications  Medication Sig Dispense Refill  . albuterol (PROAIR HFA) 108 (90 Base) MCG/ACT inhaler Inhale 2 puffs into the lungs every 4 (four) hours as needed for shortness of breath. 1 Inhaler 2  . amoxicillin (AMOXIL) 500 MG capsule Take 4 capsules by mouth 30 - 60 minutes prior to dental procedures 4 capsule 3  . Ascorbic Acid (VITAMIN C) 100 MG tablet Take 100 mg by mouth daily.    Marland Kitchen aspirin EC 81 MG tablet Take 81 mg by mouth daily.    . B Complex CAPS Take 1 tablet by mouth 2 (two) times daily.    . budesonide (PULMICORT) 0.5 MG/2ML nebulizer solution Take 2 mLs (0.5 mg total) by nebulization as needed. (Patient taking differently: Take 0.5  mg by nebulization as needed (for wheezing). ) 120 mL 1  . Cholecalciferol (VITAMIN D) 2000 UNITS tablet Take 2,000 Units by mouth every other day.     . Coenzyme Q10 (COQ10) 150 MG CAPS Take 150 mg by mouth 2 (two) times daily.     . diclofenac sodium (VOLTAREN) 1 % GEL Apply 2 g topically 4 (four) times daily. (Patient taking differently: Apply 2 g topically daily as needed (neck pain). ) 1 Tube 0  . enalapril (VASOTEC) 10 MG tablet Take 1 tablet (10 mg total) by mouth at bedtime. 90 tablet 1  . glycerin adult 2 g suppository Place 1 suppository rectally as needed for constipation. 12 suppository 0  . Krill Oil 1000 MG CAPS  Take 1 capsule by mouth daily.    Marland Kitchen MAG THREONATE-NIACINAMIDE ER PO Take 800 mg by mouth 2 (two) times daily.    . metFORMIN (GLUCOPHAGE) 500 MG tablet Take 1 tablet (500 mg total) by mouth 2 (two) times daily with a meal. (Patient taking differently: Take 500 mg by mouth daily as needed (blood sugar higher than 120). ) 180 tablet 0  . Probiotic Product (PROBIOTIC DAILY PO) Take 1 capsule by mouth daily.     No current facility-administered medications for this visit.     Allergies  Allergen Reactions  . Dilaudid [Hydromorphone Hcl] Other (See Comments)    Severe hypotension - pt states that if she is given this medication it will kill her  . Naproxen Shortness Of Breath  . Nsaids Shortness Of Breath and Other (See Comments)    Causes asthma  . Percocet [Oxycodone-Acetaminophen] Shortness Of Breath    Can tolerate oxy IR  . Latex Hives  . Other Swelling    Eye drop preservative Specific agent unspecified  . Rosuvastatin Other (See Comments)    myalgia  . Morphine And Related     Pain with IM injection    Social History   Socioeconomic History  . Marital status: Divorced    Spouse name: Not on file  . Number of children: 2  . Years of education: some college  . Highest education level: 12th grade  Occupational History  . Occupation: Works for [employer]-retired    Employer: [employer]  . Financial resource strain: Not hard at all  . Food insecurity:    Worry: Never true    Inability: Never true  . Transportation needs:    Medical: No    Non-medical: No  Tobacco Use  . Smoking status: Never Smoker  . Smokeless tobacco: Never Used  . Tobacco comment: smoking cessation materials not required  Substance and Sexual Activity  . Alcohol use: No    Alcohol/week: 0.0 standard drinks  . Drug use: No  . Sexual activity: Not Currently  Lifestyle  . Physical activity:    Days per week: 3 days    Minutes per session: 50 min  . Stress: Not at  all  Relationships  . Social connections:    Talks on phone: More than three times a week    Gets together: More than three times a week    Attends religious service: More than 4 times per year    Active member of club or organization: Yes    Attends meetings of clubs or organizations: More than 4 times per year    Relationship status: Divorced  . Intimate partner violence:    Fear of current or ex partner: No  Emotionally abused: No    Physically abused: No    Forced sexual activity: No  Other Topics Concern  . Not on file  Social History Narrative   Divorced, 2 children. No longer working     Family History  Problem Relation Age of Onset  . Heart disease Mother   . Heart disease Father   . Asthma Daughter   . Asthma Maternal Grandfather   . Heart disease Sister        smoker  . Heart attack Sister 83  . Kidney cancer Sister   . Heart disease Sister        smoker  . Heart disease Sister        smoker  . Stomach cancer Maternal Grandmother   . Colon cancer Neg Hx   . Esophageal cancer Neg Hx   . Rectal cancer Neg Hx     Review of Systems:  As stated in the HPI and otherwise negative.   BP 112/70   Pulse 64   Ht 5\' 2"  (1.575 m)   Wt 172 lb (78 kg)   SpO2 99%   BMI 31.46 kg/m   Physical Examination: General: Well developed, well nourished, NAD  HEENT: OP clear, mucus membranes moist  SKIN: warm, dry. No rashes. Neuro: No focal deficits  Musculoskeletal: Muscle strength 5/5 all ext  Psychiatric: Mood and affect normal  Neck: No JVD, no carotid bruits, no thyromegaly, no lymphadenopathy.  Lungs:Clear bilaterally, no wheezes, rhonci, crackles Cardiovascular: Regular rate and rhythm. Soft systolic murmur.  Abdomen:Soft. Bowel sounds present. Non-tender.  Extremities: No lower extremity edema. Pulses are 2 + in the bilateral DP/PT.  Echo August 2019: Left ventricle: The cavity size was normal. There was mild   concentric hypertrophy. Systolic function was  normal. The   estimated ejection fraction was in the range of 55% to 60%. Wall   motion was normal; there were no regional wall motion   abnormalities. Features are consistent with a pseudonormal left   ventricular filling pattern, with concomitant abnormal relaxation   and increased filling pressure (grade 2 diastolic dysfunction).   Doppler parameters are consistent with high ventricular filling   pressure. - Aortic valve: A bioprosthesis was present. Transvalvular velocity   was increased. There was mild to moderate stenosis. Mean gradient   (S): 22 mm Hg. Peak gradient (S): 41 mm Hg. - Mitral valve: Severely calcified annulus. Transvalvular velocity   was within the normal range. There was no evidence for stenosis.   There was mild regurgitation. - Right ventricle: The cavity size was normal. Wall thickness was   normal. Systolic function was normal. - Atrial septum: No defect or patent foramen ovale was identified   by color flow Doppler. - Tricuspid valve: There was trivial regurgitation. - Pulmonic valve: Transvalvular velocity was within the normal   range. There was no evidence for stenosis.  EKG:  EKG is not ordered today. The ekg ordered today demonstrates   Recent Labs: 03/09/2018: Brain Natriuretic Peptide 136; TSH 1.84 08/04/2018: ALT <5 08/12/2018: BUN 10; Creatinine, Ser 0.64; Hemoglobin 10.1; Platelets 259; Potassium 3.9; Sodium 144   Lipid Panel    Component Value Date/Time   CHOL 217 (H) 03/09/2018 0849   CHOL 225 (H) 08/24/2015 1023   TRIG 130 03/09/2018 0849   HDL 58 03/09/2018 0849   HDL 45 08/24/2015 1023   CHOLHDL 3.7 03/09/2018 0849   VLDL 24 08/21/2016 1004   LDLCALC 134 (H) 03/09/2018 0849   LDLDIRECT 162.7  06/18/2007 1534     Wt Readings from Last 3 Encounters:  12/14/18 172 lb (78 kg)  10/20/18 165 lb (74.8 kg)  10/09/18 167 lb (75.8 kg)     Other studies Reviewed: Additional studies/ records that were reviewed today include: . Review of the  above records demonstrates:   Assessment and Plan:   1. Aortic valve stenosis: She is s/p aortic valve replacement with placement of a 21 mm bioprosthetic AVR at [facility] in 2013. Mild AS by echo in 2018. Continue SBE prophylaxis prior to dental procedures. Continue ASA.   2. HTN: BP is well controlled. NO changes.   3. Carotid artery stenosis: s/p left carotid endarterectomy August 2019. Followed in [facility].   4. Hyperlipidemia: She is intolerant of statins. Consider Praluent or Repatha given carotid artery disease. She is discussing with her primary care doctor.   Current medicines are reviewed at length with the patient today.  The patient does not have concerns regarding medicines.  The following changes have been made:  no change  Labs/ tests ordered today include:   Orders Placed This Encounter  Procedures  . ECHOCARDIOGRAM COMPLETE    Disposition:   FU with me in 12 months  Signed, Lauree Chandler, MD 12/14/2018 9:43 AM    [facility] [place] [address], [place], [place]  [address] Phone: [phone]; Fax: [phone]

## 2018-12-14 NOTE — Patient Instructions (Signed)
Medication Instructions:  Your physician recommends that you continue on your current medications as directed. Please refer to the Current Medication list given to you today.  If you need a refill on your cardiac medications before your next appointment, please call your pharmacy.   Lab work: none If you have labs (blood work) drawn today and your tests are completely normal, you will receive your results only by: Marland Kitchen MyChart Message (if you have MyChart) OR . A paper copy in the mail If you have any lab test that is abnormal or we need to change your treatment, we will call you to review the results.  Testing/Procedures: Your physician has requested that you have an echocardiogram. Echocardiography is a painless test that uses sound waves to create images of your heart. It provides your doctor with information about the size and shape of your heart and how well your heart's chambers and valves are working. This procedure takes approximately one hour. There are no restrictions for this procedure. To be done in August 2020  Follow-Up: At [facility] you and your health needs are our priority.  As part of our continuing mission to provide you with exceptional heart care, we have created designated Provider Care Teams.  These Care Teams include your primary Cardiologist (physician) and Advanced Practice Providers (APPs -  Physician Assistants and Nurse Practitioners) who all work together to provide you with the care you need, when you need it. You will need a follow up appointment in 12 months.  Please call our office 2 months in advance to schedule this appointment.  You may see Lauree Chandler, MD or one of the following Advanced Practice Providers on your designated Care Team:   [place], PA-C Melina Copa, PA-C . Ermalinda Barrios, PA-C  Any Other Special Instructions Will Be Listed Below (If Applicable).

## 2018-12-17 ENCOUNTER — Other Ambulatory Visit: Payer: Self-pay | Admitting: Family Medicine

## 2018-12-17 DIAGNOSIS — I10 Essential (primary) hypertension: Secondary | ICD-10-CM

## 2018-12-18 ENCOUNTER — Other Ambulatory Visit: Payer: Self-pay

## 2018-12-18 ENCOUNTER — Ambulatory Visit (HOSPITAL_COMMUNITY)
Admission: RE | Admit: 2018-12-18 | Discharge: 2018-12-18 | Disposition: A | Discharge status: Home / Self Care | Payer: Medicare Other | Source: Ambulatory Visit | Attending: Family | Admitting: Family

## 2018-12-18 ENCOUNTER — Encounter: Payer: Self-pay | Admitting: Family

## 2018-12-18 ENCOUNTER — Ambulatory Visit (INDEPENDENT_AMBULATORY_CARE_PROVIDER_SITE_OTHER): Payer: Medicare Other | Admitting: Family

## 2018-12-18 VITALS — BP 110/67 | HR 59 | Temp 96.8°F | Resp 14 | Ht 61.5 in | Wt 168.0 lb

## 2018-12-18 DIAGNOSIS — I6522 Occlusion and stenosis of left carotid artery: Secondary | ICD-10-CM | POA: Diagnosis not present

## 2018-12-18 DIAGNOSIS — Z9889 Other specified postprocedural states: Secondary | ICD-10-CM | POA: Diagnosis not present

## 2018-12-18 DIAGNOSIS — I6523 Occlusion and stenosis of bilateral carotid arteries: Secondary | ICD-10-CM | POA: Insufficient documentation

## 2018-12-18 NOTE — Patient Instructions (Signed)

## 2018-12-18 NOTE — Progress Notes (Signed)
Chief Complaint: Follow up Extracranial Carotid Artery Stenosis   History of Present Illness  Bailey [name] is a 73 y.o. female who is s/p left CEA on 08-05-18 by Dr. Donzetta Matters. She  had some left eye symptoms dating back several months prior to the left CEA and was found to have increase in her flow velocities up to 80% with nearly 60% blockage by CT angio.  She denies any neurological changes since the left CEA.  She does c/o clicking sensation in her c-spine at times when rotating her neck, along with pain in her head.   The patient is right-handed. About the Summer of 2017, she had an episode of transient loss of vision in the right eye which lasted very briefly and resolved completely. This prompted a duplex scan which showed evidence of a left carotid stenosis. She denies any previous history of stroke, TIAs, expressive or receptive aphasia. She does not take aspirin consistently she tells me. She does not tolerate statins. She is not a smoker.   Pt denies chest pain or dyspnea, denies abdominal pain.   Diabetic: yes, states he last A1C was 5.? Tobacco use: non-smoker  Pt meds include: Statin : no, tried several different statins, severe myalgias reaction ASA: yes Other anticoagulants/antiplatelets: no   Past Medical History:  Diagnosis Date  . Arthritis   . Asthma   . B12 deficiency anemia   . H/O aortic valve replacement 2013   at [facility]   . History of colon polyps   . History of kidney stones   . HTN (hypertension)   . Hyperlipidemia   . Left nephrolithiasis   . OSA (obstructive sleep apnea)    STUDY 12 YRS CPAP RX-- BUT NONTOLERANT  . Right ureteral stone   . S/P aortic valve replacement    2013  AT [facility]--- CARDIOLOGIST--  DR CRAWFORD [facility]  . Seasonal allergies   . Statin intolerance   . Type 2 diabetes mellitus ([place])    type 2    Social History Social History   Tobacco Use  . Smoking status: Never Smoker  . Smokeless tobacco: Never Used  . Tobacco  comment: smoking cessation materials not required  Substance Use Topics  . Alcohol use: No    Alcohol/week: 0.0 standard drinks  . Drug use: No    Family History Family History  Problem Relation Age of Onset  . Heart disease Mother   . Heart disease Father   . Asthma Daughter   . Asthma Maternal Grandfather   . Heart disease Sister        smoker  . Heart attack Sister 37  . Kidney cancer Sister   . Heart disease Sister        smoker  . Heart disease Sister        smoker  . Stomach cancer Maternal Grandmother   . Colon cancer Neg Hx   . Esophageal cancer Neg Hx   . Rectal cancer Neg Hx     Surgical History Past Surgical History:  Procedure Laterality Date  . AORTIC VALVE REPLACEMENT  06/2012   [facility]  . APPENDECTOMY  1958  . CHOLECYSTECTOMY  1978  . COLONOSCOPY  05-09-2014  . CYSTOSCOPY/RETROGRADE/URETEROSCOPY Right 07/04/2014   Procedure: CYSTOSCOPY/RETROGRADE/URETEROSCOPY;  Surgeon: Bernestine Amass, MD;  Location: [facility];  Service: Urology;  Laterality: Right;  . ENDARTERECTOMY Left 08/05/2018   Procedure: ENDARTERECTOMY CAROTID LEFT;  Surgeon: Waynetta Sandy, MD;  Location: [place];  Service:  Vascular;  Laterality: Left;  . ESOPHAGOGASTRODUODENOSCOPY    . EXTRACORPOREAL SHOCK WAVE LITHOTRIPSY Right 06/ 2015     [facility]  . HOLMIUM LASER APPLICATION Right 1/61/0960   Procedure: HOLMIUM LASER APPLICATION;  Surgeon: Bernestine Amass, MD;  Location[facility];  Service: Urology;  Laterality: Right;  . PATCH ANGIOPLASTY Left 08/05/2018   Procedure: PATCH ANGIOPLASTY USING XENOSURE BIOLOGIC PATCH 1cm x 6cm;  Surgeon: Waynetta Sandy, MD;  Location: [place];  Service: Vascular;  Laterality: Left;  . TONSILLECTOMY  1953    Allergies  Allergen Reactions  . Dilaudid [Hydromorphone Hcl] Other (See Comments)    Severe hypotension - pt states that if she is given this medication it will kill her  . Naproxen Shortness Of Breath  .  Nsaids Shortness Of Breath and Other (See Comments)    Causes asthma  . Percocet [Oxycodone-Acetaminophen] Shortness Of Breath    Can tolerate oxy IR  . Latex Hives  . Other Swelling    Eye drop preservative Specific agent unspecified  . Rosuvastatin Other (See Comments)    myalgia  . Morphine And Related     Pain with IM injection    Current Outpatient Medications  Medication Sig Dispense Refill  . albuterol (PROAIR HFA) 108 (90 Base) MCG/ACT inhaler Inhale 2 puffs into the lungs every 4 (four) hours as needed for shortness of breath. 1 Inhaler 2  . amoxicillin (AMOXIL) 500 MG capsule Take 4 capsules by mouth 30 - 60 minutes prior to dental procedures 4 capsule 3  . Ascorbic Acid (VITAMIN C) 100 MG tablet Take 100 mg by mouth daily.    Marland Kitchen aspirin EC 81 MG tablet Take 81 mg by mouth daily.    . B Complex CAPS Take 1 tablet by mouth 2 (two) times daily.    . budesonide (PULMICORT) 0.5 MG/2ML nebulizer solution Take 2 mLs (0.5 mg total) by nebulization as needed. (Patient taking differently: Take 0.5 mg by nebulization as needed (for wheezing). ) 120 mL 1  . Cholecalciferol (VITAMIN D) 2000 UNITS tablet Take 2,000 Units by mouth every other day.     . Coenzyme Q10 (COQ10) 150 MG CAPS Take 150 mg by mouth 2 (two) times daily.     . diclofenac sodium (VOLTAREN) 1 % GEL Apply 2 g topically 4 (four) times daily. (Patient taking differently: Apply 2 g topically daily as needed (neck pain). ) 1 Tube 0  . enalapril (VASOTEC) 10 MG tablet TAKE 1 TABLET(10 MG) BY MOUTH DAILY 90 tablet 1  . glycerin adult 2 g suppository Place 1 suppository rectally as needed for constipation. 12 suppository 0  . Krill Oil 1000 MG CAPS Take 1 capsule by mouth daily.    Marland Kitchen MAG THREONATE-NIACINAMIDE ER PO Take 800 mg by mouth 2 (two) times daily.    . metFORMIN (GLUCOPHAGE) 500 MG tablet Take 1 tablet (500 mg total) by mouth 2 (two) times daily with a meal. (Patient taking differently: Take 500 mg by mouth daily as  needed (blood sugar higher than 120). ) 180 tablet 0  . Probiotic Product (PROBIOTIC DAILY PO) Take 1 capsule by mouth daily.     No current facility-administered medications for this visit.     Review of Systems : See HPI for pertinent positives and negatives.  Physical Examination  Vitals:   12/18/18 1001 12/18/18 1007  BP: 99/63 110/67  Pulse: 63 (!) 59  Resp: 14   Temp: (!) 96.8 F (36 C)  TempSrc: Oral   SpO2: 100%   Weight: 168 lb (76.2 kg)   Height: 5' 1.5" (1.562 m)    Body mass index is 31.23 kg/m.  General: WDWN obese female in NAD GAIT: normal Eyes: PERRLA HENT: No gross abnormalities.  Pulmonary:  Respirations are non-labored, good air movement in all fields, CTAB, no rales, rhonchi, or wheezes. Cardiac: regular rhythm, + murmur.  VASCULAR EXAM Carotid Bruits Right Left   Positive Positive     Abdominal aortic pulse is not palpable. Radial pulses are 1+ palpable and  equal.                                                                                                                            LE Pulses Right Left       POPLITEAL  not palpable   not palpable       POSTERIOR TIBIAL  not palpable   not palpable        DORSALIS PEDIS      ANTERIOR TIBIAL faintly palpable  Faintly palpable     Gastrointestinal: soft, nontender, BS WNL, no r/g, no palpable masses. Musculoskeletal: no muscle atrophy/wasting. M/S 5/5 in lower extremities, 4/5 in upper extremities, extremities without ischemic changes. Arthritic changes in hands. (pt states she is not able to grip well due to arthritis in her hands).  Skin: No rashes, no ulcers, no cellulitis.   Neurologic:  A&O X 3; appropriate affect, sensation is normal; speech is normal, CN 2-12 intact, pain and light touch intact in extremities, motor exam as listed above. Psychiatric: Normal thought content, mood appropriate to clinical situation.    Assessment: Bailey [name] is a 73 y.o. female who is s/p left  CEA on 08-05-18 by Dr. Donzetta Matters. She  had some left eye symptoms dating back several months prior to the left CEA and was found to have increase in her flow velocities up to 80% with nearly 60% blockage by CT angio.  She denies any neurological changes since the left CEA.    Pt remains asymptomatic after the left CEA. There is 40-59% stenosis in the left CEA site on today's carotid duplex, and right ICA wit increased stenosis compared to the carotid duplex prior to the left CEA.  I discussed this with Dr. Donzetta Matters, see Plan.   DATA  Carotid Duplex (12-18-18): Right ICA: 40-59% stenosis (was 1-39% stenosis on 07-10-18) Left ICA: CEA site with 40-59% stenosis Right vertebral artery is antegrade, left is antegrade with systolic deceleration.  Bilateral subclavian artery waveforms are turbulent.  Increased stenosis in the bilateral ICA compared to the pre-op exam on 07-10-18.    Plan: Follow-up in 3 months with Carotid Duplex scan, see Dr. Donzetta Matters only.    Pt would like to let me know, she will call if she wants referral to Dr. Erline Levine for evaluation of her c-spine, clicking sensation in her neck sometimes when she rotates her neck, tingling sensation in her hands  at times.    I discussed in depth with the patient the nature of atherosclerosis, and emphasized the importance of maximal medical management including strict control of blood pressure, blood glucose, and lipid levels, obtaining regular exercise, and continued cessation of smoking.  The patient is aware that without maximal medical management the underlying atherosclerotic disease process will progress, limiting the benefit of any interventions. The patient was given information about stroke prevention and what symptoms should prompt the patient to seek immediate medical care. Thank you for allowing [place] to participate in this patient's care.  Clemon Chambers, RN, MSN, FNP-C [facility] Office:  [phone]  Clinic Physician: Donzetta Matters  12/18/18 10:27 AM

## 2018-12-23 ENCOUNTER — Ambulatory Visit (AMBULATORY_SURGERY_CENTER): Payer: Self-pay | Admitting: *Deleted

## 2018-12-23 VITALS — Ht 61.5 in | Wt 166.0 lb

## 2018-12-23 DIAGNOSIS — Z86010 Personal history of colonic polyps: Secondary | ICD-10-CM

## 2018-12-23 MED ORDER — PEG 3350-KCL-NA BICARB-NACL 420 G PO SOLR: 4000.0000 mL | Freq: Once | ORAL | 0 refills | Status: AC

## 2018-12-23 NOTE — Progress Notes (Signed)
No egg or soy allergy known to patient  No issues with past sedation with any surgeries  or procedures, no intubation problems  No diet pills per patient No home 02 use per patient  No blood thinners per patient - off Plavix x 3-4 weeks now per pt. Will not restart  Pt denies issues with constipation  No A fib or A flutter  EMMI video sent to pt's e mail - pt declined

## 2018-12-28 ENCOUNTER — Other Ambulatory Visit: Payer: Self-pay | Admitting: Family Medicine

## 2018-12-28 DIAGNOSIS — E1169 Type 2 diabetes mellitus with other specified complication: Secondary | ICD-10-CM

## 2018-12-28 DIAGNOSIS — E669 Obesity, unspecified: Principal | ICD-10-CM

## 2019-01-01 ENCOUNTER — Ambulatory Visit (AMBULATORY_SURGERY_CENTER): Payer: Medicare Other | Admitting: Gastroenterology

## 2019-01-01 ENCOUNTER — Encounter: Payer: Self-pay | Admitting: Gastroenterology

## 2019-01-01 VITALS — BP 102/63 | HR 97 | Temp 97.5°F | Resp 16 | Ht 61.5 in | Wt 168.0 lb

## 2019-01-01 DIAGNOSIS — Z86010 Personal history of colonic polyps: Secondary | ICD-10-CM

## 2019-01-01 DIAGNOSIS — G4733 Obstructive sleep apnea (adult) (pediatric): Secondary | ICD-10-CM | POA: Diagnosis not present

## 2019-01-01 DIAGNOSIS — I10 Essential (primary) hypertension: Secondary | ICD-10-CM | POA: Diagnosis not present

## 2019-01-01 DIAGNOSIS — E119 Type 2 diabetes mellitus without complications: Secondary | ICD-10-CM | POA: Diagnosis not present

## 2019-01-01 MED ORDER — SODIUM CHLORIDE 0.9 % IV SOLN: 500.0000 mL | Freq: Once | INTRAVENOUS | Status: DC

## 2019-01-01 NOTE — Patient Instructions (Signed)
YOU HAD AN ENDOSCOPIC PROCEDURE TODAY AT [facility]:   Refer to the procedure report that was given to you for any specific questions about what was found during the examination.  If the procedure report does not answer your questions, please call your gastroenterologist to clarify.  If you requested that your care partner not be given the details of your procedure findings, then the procedure report has been included in a sealed envelope for you to review at your convenience later.  YOU SHOULD EXPECT: Some feelings of bloating in the abdomen. Passage of more gas than usual.  Walking can help get rid of the air that was put into your GI tract during the procedure and reduce the bloating. If you had a lower endoscopy (such as a colonoscopy or flexible sigmoidoscopy) you may notice spotting of blood in your stool or on the toilet paper. If you underwent a bowel prep for your procedure, you may not have a normal bowel movement for a few days.  Please Note:  You might notice some irritation and congestion in your nose or some drainage.  This is from the oxygen used during your procedure.  There is no need for concern and it should clear up in a day or so.  SYMPTOMS TO REPORT IMMEDIATELY:   Following lower endoscopy (colonoscopy or flexible sigmoidoscopy):  Excessive amounts of blood in the stool  Significant tenderness or worsening of abdominal pains  Swelling of the abdomen that is new, acute  Fever of 100F or higher  For urgent or emergent issues, a gastroenterologist can be reached at any hour by calling [phone].   DIET:  We do recommend a small meal at first, but then you may proceed to your regular diet.  Drink plenty of fluids but you should avoid alcoholic beverages for 24 hours.  ACTIVITY:  You should plan to take it easy for the rest of today and you should NOT DRIVE or use heavy machinery until tomorrow (because of the sedation medicines used during the test).     FOLLOW UP: Our staff will call the number listed on your records the next business day following your procedure to check on you and address any questions or concerns that you may have regarding the information given to you following your procedure. If we do not reach you, we will leave a message.  However, if you are feeling well and you are not experiencing any problems, there is no need to return our call.  We will assume that you have returned to your regular daily activities without incident.  If any biopsies were taken you will be contacted by phone or by letter within the next 1-3 weeks.  Please call us at [phone] if you have not heard about the biopsies in 3 weeks.    SIGNATURES/CONFIDENTIALITY: You and/or your care partner have signed paperwork which will be entered into your electronic medical record.  These signatures attest to the fact that that the information above on your After Visit Summary has been reviewed and is understood.  Full responsibility of the confidentiality of this discharge information lies with  you and/or your care-partner  Recall 5 years  2025

## 2019-01-01 NOTE — Progress Notes (Signed)
Pt's states no medical or surgical changes since previsit or office visit. 

## 2019-01-01 NOTE — Op Note (Signed)
[place] Patient Name: Bailey [name] Procedure Date: 01/01/2019 11:33 AM MRN: [id number] Endoscopist: Milus Banister , MD Age: 73 Referring MD:  Date of Birth: 31-Aug-1946 Gender: Female Account #: [id number] Procedure:                Colonoscopy Indications:              High risk colon cancer surveillance: Personal                            history of colonic polyps; Precancerous colon                            polyps. Colonoscopy 2015, Dr. Ardis Hughs found 3                            subcentimeter adenomas, recommended to have repeat                            colonoscopy at 3-year interval. Colonoscopy 2012                            Dr. Ardis Hughs found 3 subcentimeter adenomas. Previous                            colonoscopies by outside gastroenterologist also                            found precancerous colon polyps. Medicines:                Monitored Anesthesia Care Procedure:                Pre-Anesthesia Assessment:                           - Prior to the procedure, a History and Physical                            was performed, and patient medications and                            allergies were reviewed. The patient's tolerance of                            previous anesthesia was also reviewed. The risks                            and benefits of the procedure and the sedation                            options and risks were discussed with the patient.                            All questions were answered, and informed consent  was obtained. Prior Anticoagulants: The patient has                            taken Plavix (clopidogrel), last dose was 5 days                            prior to procedure. ASA Grade Assessment: III - A                            patient with severe systemic disease. After                            reviewing the risks and benefits, the patient was                            deemed in satisfactory  condition to undergo the                            procedure.                           After obtaining informed consent, the colonoscope                            was passed under direct vision. Throughout the                            procedure, the patient's blood pressure, pulse, and                            oxygen saturations were monitored continuously. The                            Colonoscope was introduced through the anus and                            advanced to the the cecum, identified by                            appendiceal orifice and ileocecal valve. The                            colonoscopy was performed without difficulty. The                            patient tolerated the procedure well. The quality                            of the bowel preparation was good. The ileocecal                            valve, appendiceal orifice, and rectum were  photographed. Scope In: 11:38:52 AM Scope Out: 11:53:42 AM Scope Withdrawal Time: 0 hours 8 minutes 45 seconds  Total Procedure Duration: 0 hours 14 minutes 50 seconds  Findings:                 The entire examined colon appeared normal on direct                            and retroflexion views. Complications:            No immediate complications. Estimated blood loss:                            None. Estimated Blood Loss:     Estimated blood loss: none. Impression:               - The entire examined colon is normal on direct and                            retroflexion views.                           - No polyps or cancers. Recommendation:           - Patient has a contact number available for                            emergencies. The signs and symptoms of potential                            delayed complications were discussed with the                            patient. Return to normal activities tomorrow.                            Written discharge instructions were provided to  the                            patient.                           - Resume previous diet.                           - Continue present medications.                           - Repeat colonoscopy in 5 years for surveillance. Milus Banister, MD 01/01/2019 11:56:16 AM This report has been signed electronically.

## 2019-01-01 NOTE — Progress Notes (Signed)
Report given to PACU, vss 

## 2019-01-04 ENCOUNTER — Telehealth: Payer: Self-pay

## 2019-01-04 NOTE — Telephone Encounter (Signed)
  Follow up Call-  Call back number 01/01/2019  Post procedure Call Back phone  # [phone]  Permission to leave phone message Yes  Some recent data might be hidden     Patient questions:  Do you have a fever, pain , or abdominal swelling? No Pain Score 0  Have you tolerated food without any problems? Yes  Have you been able to return to your normal activities? Yes  Do you have any questions about your discharge instructions: Diet   No Medications  No Follow up visit  No  Do you have questions or concerns about your Care? No  Actions: * If pain score is 4 or above: No action needed, pain <4

## 2019-01-08 DIAGNOSIS — H838X3 Other specified diseases of inner ear, bilateral: Secondary | ICD-10-CM | POA: Diagnosis not present

## 2019-01-08 DIAGNOSIS — H903 Sensorineural hearing loss, bilateral: Secondary | ICD-10-CM | POA: Diagnosis not present

## 2019-01-08 DIAGNOSIS — H6123 Impacted cerumen, bilateral: Secondary | ICD-10-CM | POA: Diagnosis not present

## 2019-01-08 DIAGNOSIS — R42 Dizziness and giddiness: Secondary | ICD-10-CM | POA: Diagnosis not present

## 2019-01-13 DIAGNOSIS — R42 Dizziness and giddiness: Secondary | ICD-10-CM | POA: Diagnosis not present

## 2019-01-13 DIAGNOSIS — H8111 Benign paroxysmal vertigo, right ear: Secondary | ICD-10-CM | POA: Diagnosis not present

## 2019-01-26 DIAGNOSIS — R42 Dizziness and giddiness: Secondary | ICD-10-CM | POA: Diagnosis not present

## 2019-02-04 ENCOUNTER — Other Ambulatory Visit: Payer: Self-pay | Admitting: Family Medicine

## 2019-02-04 DIAGNOSIS — E1169 Type 2 diabetes mellitus with other specified complication: Secondary | ICD-10-CM

## 2019-02-04 DIAGNOSIS — E669 Obesity, unspecified: Principal | ICD-10-CM

## 2019-02-04 NOTE — Telephone Encounter (Signed)
Refill request for diabetic medication:   Metformin 500 mg  Last office visit pertaining to diabetes: 10/09/2018   Lab Results  Component Value Date   HGBA1C 5.4 10/09/2018    Follow-ups on file. 04/09/2019

## 2019-02-08 ENCOUNTER — Telehealth: Payer: Self-pay

## 2019-02-08 NOTE — Telephone Encounter (Signed)
Copied from [place] [id number]. Topic: Appointment Scheduling - Scheduling Inquiry for Clinic >> Feb 08, 2019 12:07 PM Bea Graff, NT wrote: Reason for CRM: Pt would like to come in to have the new high dose pneumonia vaccine. Please call to schedule. >> Feb 08, 2019 12:15 PM [place], Marland Kitchen wrote: Do we have this in office?

## 2019-02-10 ENCOUNTER — Ambulatory Visit: Payer: Self-pay

## 2019-02-11 ENCOUNTER — Ambulatory Visit: Payer: Medicare Other

## 2019-02-11 ENCOUNTER — Telehealth: Payer: Self-pay

## 2019-02-11 ENCOUNTER — Other Ambulatory Visit: Payer: Self-pay | Admitting: Family Medicine

## 2019-02-11 DIAGNOSIS — J4520 Mild intermittent asthma, uncomplicated: Secondary | ICD-10-CM

## 2019-02-11 MED ORDER — BUDESONIDE 0.5 MG/2ML IN SUSP: 0.5000 mg | RESPIRATORY_TRACT | 1 refills | Status: DC | PRN

## 2019-02-11 MED ORDER — ALBUTEROL SULFATE HFA 108 (90 BASE) MCG/ACT IN AERS: 2.0000 | INHALATION_SPRAY | RESPIRATORY_TRACT | 2 refills | Status: DC | PRN

## 2019-02-11 NOTE — Telephone Encounter (Signed)
Copied from [place] [phone]. Topic: Compliment - Staff >> Feb 11, 2019  9:00 AM Alanda Slim E wrote: Date of encounter: 3.4.20 Details of compliment: The staff does a wonderful job and she apologizes for her mistake in coming in for her vaccine when she wasn't suppose to.  Who would the patient like to thank/see rewarded?  On a scale of 1-10, how was your experience? 9 - Pt was just disappointed she made a mistake and came in and wasn't able to get her vaccine   Route to Engineer, building services.

## 2019-02-11 NOTE — Telephone Encounter (Signed)
PATIENT NEEDS BOTH INHALERS

## 2019-02-25 DIAGNOSIS — Z6829 Body mass index (BMI) 29.0-29.9, adult: Secondary | ICD-10-CM | POA: Diagnosis not present

## 2019-02-25 DIAGNOSIS — M064 Inflammatory polyarthropathy: Secondary | ICD-10-CM | POA: Diagnosis not present

## 2019-02-25 DIAGNOSIS — M150 Primary generalized (osteo)arthritis: Secondary | ICD-10-CM | POA: Diagnosis not present

## 2019-02-25 DIAGNOSIS — M2550 Pain in unspecified joint: Secondary | ICD-10-CM | POA: Diagnosis not present

## 2019-02-25 DIAGNOSIS — E663 Overweight: Secondary | ICD-10-CM | POA: Diagnosis not present

## 2019-02-25 DIAGNOSIS — M154 Erosive (osteo)arthritis: Secondary | ICD-10-CM | POA: Diagnosis not present

## 2019-03-15 ENCOUNTER — Telehealth: Payer: Self-pay | Admitting: Cardiovascular Disease

## 2019-03-15 NOTE — Telephone Encounter (Signed)
New Message:   Patient called her primary doctor. Patient had a elposid and patient states that her Daughter and son-in-law has Corenevirus. Patient says she is SOB. Her primary care told her to call the doctor light headed sometimes. Please call patient.

## 2019-03-15 NOTE — Telephone Encounter (Signed)
Called patient back about her message. Patient complained of having two episodes of SOB after taking a walk. Patient stated she walks daily roughly 3 to 5 miles. Patient stated over the weekend she had these episodes, but she also has history of asthma and her daughter and son-in-law are showing signs of [name]. Patient's PCP thinks it might have been patient's heart. Patient stated she does not think it is her heart, but she would like to have a visit. Informed patient with her symptoms we could only do a virtual visit, since she has been possibly exposed to [place]. Patient was in agreement. Made patient an appointment on 03/25/19 with Dr. Angelena Form. Patient stated that after these SOB attacks she had used her inhaler and had a nebulizer treatment and her symptoms did improve. Patient denies have any chest pain. Patient stated she went for a walk yesterday and she was fine. Informed patient if this happens again to call 911. Informed patient that her message would be sent to Dr. Angelena Form for further advisement.  Sent patient information about virtual visit through [facility].      Virtual Visit Pre-Appointment Phone Call  Steps For Call:  1. Confirm consent - "In the setting of the current Covid19 crisis, you are scheduled for a (phone or video) visit with your provider on (date) at (time).  Just as we do with many in-office visits, in order for you to participate in this visit, we must obtain consent.  If you'd like, I can send this to your mychart (if signed up) or email for you to review.  Otherwise, I can obtain your verbal consent now.  All virtual visits are billed to your insurance company just like a normal visit would be.  By agreeing to a virtual visit, we'd like you to understand that the technology does not allow for your provider to perform an examination, and thus may limit your provider's ability to fully assess your condition.  Finally, though the technology is pretty good, we cannot assure  that it will always work on either your or our end, and in the setting of a video visit, we may have to convert it to a phone-only visit.  In either situation, we cannot ensure that we have a secure connection.  Are you willing to proceed?"  2. Give patient instructions for WebEx download to smartphone as below if video visit  3. Advise patient to be prepared with any vital sign or heart rhythm information, their current medicines, and a piece of paper and pen handy for any instructions they may receive the day of their visit  4. Inform patient they will receive a phone call 15 minutes prior to their appointment time (may be from unknown caller ID) so they should be prepared to answer  5. Confirm that appointment type is correct in Epic appointment notes (video vs telephone)    TELEPHONE CALL NOTE  Bailey [name] has been deemed a candidate for a follow-up tele-health visit to limit community exposure during the Covid-19 pandemic. I spoke with the patient via phone to ensure availability of phone/video source, confirm preferred email & phone number, and discuss instructions and expectations.  I reminded Bailey [name] to be prepared with any vital sign and/or heart rhythm information that could potentially be obtained via home monitoring, at the time of her visit. I reminded Bailey [name] to expect a phone call at the time of her visit if her visit.  Did the patient verbally acknowledge consent  to treatment? Yes  Bailey Barter, RN 03/15/2019 4:27 PM   DOWNLOADING THE [facility], go to [employer] and type in WebEx in the search bar. [place] [employer], the blue/green circle. The app is free but as with any other app downloads, their phone may require them to verify saved payment information or Apple password. The patient does NOT have to create an account.  - If Android, ask patient to go to [employer] and type in WebEx in the search  bar. [place] [employer], the blue/green circle. The app is free but as with any other app downloads, their phone may require them to verify saved payment information or Android password. The patient does NOT have to create an account.   CONSENT FOR TELE-HEALTH VISIT - PLEASE REVIEW  I hereby voluntarily request, consent and authorize [facility] and its employed or contracted physicians, physician assistants, nurse practitioners or other licensed health care professionals (the Practitioner), to provide me with telemedicine health care services (the Services") as deemed necessary by the treating Practitioner. I acknowledge and consent to receive the Services by the Practitioner via telemedicine. I understand that the telemedicine visit will involve communicating with the Practitioner through live audiovisual communication technology and the disclosure of certain medical information by electronic transmission. I acknowledge that I have been given the opportunity to request an in-person assessment or other available alternative prior to the telemedicine visit and am voluntarily participating in the telemedicine visit.  I understand that I have the right to withhold or withdraw my consent to the use of telemedicine in the course of my care at any time, without affecting my right to future care or treatment, and that the Practitioner or I may terminate the telemedicine visit at any time. I understand that I have the right to inspect all information obtained and/or recorded in the course of the telemedicine visit and may receive copies of available information for a reasonable fee.  I understand that some of the potential risks of receiving the Services via telemedicine include:   Delay or interruption in medical evaluation due to technological equipment failure or disruption;  Information transmitted may not be sufficient (e.g. poor resolution of images) to allow for appropriate medical decision  making by the Practitioner; and/or   In rare instances, security protocols could fail, causing a breach of personal health information.  Furthermore, I acknowledge that it is my responsibility to provide information about my medical history, conditions and care that is complete and accurate to the best of my ability. I acknowledge that Practitioner's advice, recommendations, and/or decision may be based on factors not within their control, such as incomplete or inaccurate data provided by me or distortions of diagnostic images or specimens that may result from electronic transmissions. I understand that the practice of medicine is not an exact science and that Practitioner makes no warranties or guarantees regarding treatment outcomes. I acknowledge that I will receive a copy of this consent concurrently upon execution via email to the email address I last provided but may also request a printed copy by calling the office of [facility].    I understand that my insurance will be billed for this visit.   I have read or had this consent read to me.  I understand the contents of this consent, which adequately explains the benefits and risks of the Services being provided via telemedicine.   I have been provided ample opportunity  to ask questions regarding this consent and the Services and have had my questions answered to my satisfaction.  I give my informed consent for the services to be provided through the use of telemedicine in my medical care  By participating in this telemedicine visit I agree to the above.

## 2019-03-16 NOTE — Telephone Encounter (Signed)
Spoke with pt and made her aware of what Dr. Angelena Form said.  Pt states since he doesn't feel like it is her heart and she doesn't either, she will cancel appt for now.  She will contact the office or go to the ER if any further episodes.

## 2019-03-16 NOTE — Telephone Encounter (Signed)
Agree that this does not sound like her heart but I can see her next week with a virtual visit. Thanks, chris

## 2019-03-19 ENCOUNTER — Ambulatory Visit: Payer: Self-pay | Admitting: Vascular Surgery

## 2019-03-19 ENCOUNTER — Encounter (HOSPITAL_COMMUNITY): Payer: Self-pay

## 2019-03-25 ENCOUNTER — Telehealth: Payer: Self-pay | Admitting: Cardiovascular Disease

## 2019-03-26 ENCOUNTER — Encounter (HOSPITAL_COMMUNITY): Payer: Self-pay

## 2019-03-26 ENCOUNTER — Ambulatory Visit: Payer: Self-pay | Admitting: Vascular Surgery

## 2019-04-09 ENCOUNTER — Other Ambulatory Visit: Payer: Self-pay

## 2019-04-09 ENCOUNTER — Encounter: Payer: Self-pay | Admitting: Family Medicine

## 2019-04-09 ENCOUNTER — Ambulatory Visit (INDEPENDENT_AMBULATORY_CARE_PROVIDER_SITE_OTHER): Payer: Medicare Other | Admitting: Family Medicine

## 2019-04-09 VITALS — Wt 162.0 lb

## 2019-04-09 DIAGNOSIS — G4733 Obstructive sleep apnea (adult) (pediatric): Secondary | ICD-10-CM

## 2019-04-09 DIAGNOSIS — I10 Essential (primary) hypertension: Secondary | ICD-10-CM | POA: Diagnosis not present

## 2019-04-09 DIAGNOSIS — I6523 Occlusion and stenosis of bilateral carotid arteries: Secondary | ICD-10-CM | POA: Diagnosis not present

## 2019-04-09 DIAGNOSIS — Z952 Presence of prosthetic heart valve: Secondary | ICD-10-CM | POA: Diagnosis not present

## 2019-04-09 DIAGNOSIS — J4520 Mild intermittent asthma, uncomplicated: Secondary | ICD-10-CM | POA: Diagnosis not present

## 2019-04-09 DIAGNOSIS — E785 Hyperlipidemia, unspecified: Secondary | ICD-10-CM

## 2019-04-09 DIAGNOSIS — E669 Obesity, unspecified: Secondary | ICD-10-CM

## 2019-04-09 DIAGNOSIS — E119 Type 2 diabetes mellitus without complications: Secondary | ICD-10-CM | POA: Diagnosis not present

## 2019-04-09 DIAGNOSIS — Z8639 Personal history of other endocrine, nutritional and metabolic disease: Secondary | ICD-10-CM | POA: Diagnosis not present

## 2019-04-09 DIAGNOSIS — M353 Polymyalgia rheumatica: Secondary | ICD-10-CM

## 2019-04-09 DIAGNOSIS — D649 Anemia, unspecified: Secondary | ICD-10-CM | POA: Diagnosis not present

## 2019-04-09 MED ORDER — ENALAPRIL MALEATE 10 MG PO TABS: 10.0000 mg | ORAL_TABLET | Freq: Every day | ORAL | 1 refills | Status: DC

## 2019-04-09 MED ORDER — MONTELUKAST SODIUM 10 MG PO TABS: 10.0000 mg | ORAL_TABLET | Freq: Every day | ORAL | 0 refills | Status: DC

## 2019-04-09 NOTE — Progress Notes (Signed)
Name: Bailey [name]   MRN: [id number]    DOB: [birth year]   Date:04/09/2019       Progress Note  Subjective  Chief Complaint  Chief Complaint  Patient presents with   Diabetes    I connected with  [name]  on 04/09/19 at  9:40 AM EDT by a video enabled telemedicine application and verified that I am speaking with the correct person using two identifiers.  I discussed the limitations of evaluation and management by telemedicine and the availability of in person appointments. The patient expressed understanding and agreed to proceed. Staff also discussed with the patient that there may be a patient responsible charge related to this service. Patient Location: at home Provider Location: [facility]  HPI  DMII: she has lost weight since last visit. She has not been checking her glucose at home but denies polyphagia, polydipsia or polyuria.Last hgbA1C was 6.0%down to 5.5%,5.7%, 5.6, 5.4%. She has been physically active, walking daily.   On Ace for kidney protection, dyslipidemia - she only believes in fish oil, refuses statin therapy.Discussed PKS9 and also Ezetimide but she refuses still. She only seldom takes Metformin since fasting glucose has been below 100   Asthma Mild : she is usesPulmicort prn, she had one episode of shortness of breath while walking , she has also noticed cough when outside. She states her albuterol does not seem to work as well - generic form. Discussed adding Symbicort but she states just needs something prn. We will try adding singulair to see if controls allergy symptoms    HTN: taking medication and denies side effects of medication, bp is at goal. No dizziness, chest pain recently but has palpitation and she will contact her cardiologist for follow up  Obesity: sheis physically active, no weight today. She tries to eat healthy   Dyslipidemia: unable to tolerate statin therapy, she is taking fish oil only. She is aware of  importance of medication, she refuses Zetia of PKS9 injection. Unchanged  Hearing loss: seen by ENT, needs hearing aid but can't afford it at this time . 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 Posey Rea and is on plaquenil now. She had labs done but I cannot see it.   Patient Active Problem List   Diagnosis Date Noted   Left carotid artery stenosis 08/05/2018   OSA (obstructive sleep apnea) 01/20/2017   Mild cognitive impairment 01/20/2017   Carotid atherosclerosis, bilateral 01/20/2017   Abnormal brain MRI 08/26/2016   Inflammatory polyarthritis ([place]) 11/12/2015   Erosive osteoarthritis of hand 11/12/2015   History of vitamin D deficiency 11/06/2015   Hearing loss 08/24/2015   Asthma, mild intermittent 08/23/2015   Arthritis, degenerative 08/23/2015   Dyslipidemia 08/23/2015   H/O: hypothyroidism 08/23/2015   Obesity (BMI 30-39.9) 08/23/2015   HZV (herpes zoster virus) post herpetic neuralgia 08/23/2015   History of renal stone 04/01/2014   Diabetes mellitus, controlled ([place]) 04/01/2014   Essential hypertension, benign 04/01/2014   H/O prosthetic heart valve 06/15/2012   CD (contact dermatitis) 07/25/2008    Past Surgical History:  Procedure Laterality Date   AORTIC VALVE REPLACEMENT  06/2012   [place]   COLONOSCOPY  05-09-2014   COLONOSCOPY     CYSTOSCOPY/RETROGRADE/URETEROSCOPY Right 07/04/2014   Procedure: CYSTOSCOPY/RETROGRADE/URETEROSCOPY;  Surgeon: Bernestine Amass, MD;  Location: [facility];  Service: Urology;  Laterality: Right;   ENDARTERECTOMY Left 08/05/2018  Procedure: ENDARTERECTOMY CAROTID LEFT;  Surgeon: Waynetta Sandy, MD;  Location: [facility];  Service: Vascular;  Laterality: Left;   ESOPHAGOGASTRODUODENOSCOPY     EXTRACORPOREAL SHOCK WAVE LITHOTRIPSY Right 06/ 2015     [place]   HOLMIUM LASER APPLICATION Right 1/61/0960   Procedure:  HOLMIUM LASER APPLICATION;  Surgeon: Bernestine Amass, MD;  Location[facility];  Service: Urology;  Laterality: Right;   PATCH ANGIOPLASTY Left 08/05/2018   Procedure: PATCH ANGIOPLASTY USING XENOSURE BIOLOGIC PATCH 1cm x 6cm;  Surgeon: Waynetta Sandy, MD;  Location[facility];  Service: Vascular;  Laterality: Left;   POLYPECTOMY     TONSILLECTOMY  1953   UPPER GASTROINTESTINAL ENDOSCOPY      Family History  Problem Relation Age of Onset   Heart disease Mother    Heart disease Father    Asthma Daughter    Asthma Maternal Grandfather    Heart disease Sister        smoker   Heart attack Sister 37   Kidney cancer Sister    Heart disease Sister        smoker   Heart disease Sister        smoker   Stomach cancer Maternal Grandmother    Colon cancer Neg Hx    Esophageal cancer Neg Hx    Rectal cancer Neg Hx    Colon polyps Neg Hx     Social History   Socioeconomic History   Marital status: Divorced    Spouse name: Not on file   Number of children: 2   Years of education: some college   Highest education level: 12th grade  Occupational History   Occupation: Works for [employer]-retired    Employer: [employer] resource strain: Not hard at all   Food insecurity:    Worry: Never true    Inability: Never true   Transportation needs:    Medical: No    Non-medical: No  Tobacco Use   Smoking status: Never Smoker   Smokeless tobacco: Never Used   Tobacco comment: smoking cessation materials not required  Substance and Sexual Activity   Alcohol use: No    Alcohol/week: 0.0 standard drinks   Drug use: No   Sexual activity: Not Currently  Lifestyle   Physical activity:    Days per week: 3 days    Minutes per session: 50 min   Stress: Not at all  Relationships   Social connections:    Talks on phone: More than three times a week    Gets together: More than three times a  week    Attends religious service: More than 4 times per year    Active member of club or organization: Yes    Attends meetings of clubs or organizations: More than 4 times per year    Relationship status: Divorced   Intimate partner violence:    Fear of current or ex partner: No    Emotionally abused: No    Physically abused: No    Forced sexual activity: No  Other Topics Concern   Not on file  Social History Narrative   Divorced, 2 children. No longer working      Current Outpatient Medications:    albuterol (PROAIR HFA) 108 (90 Base) MCG/ACT inhaler, Inhale 2 puffs into the lungs every 4 (four) hours as needed for shortness of breath., Disp: 1 Inhaler, Rfl: 2   amoxicillin (AMOXIL) 500 MG capsule, Take 4  capsules by mouth 30 - 60 minutes prior to dental procedures, Disp: 4 capsule, Rfl: 3   Ascorbic Acid (VITAMIN C) 100 MG tablet, Take 100 mg by mouth daily., Disp: , Rfl:    aspirin EC 81 MG tablet, Take 81 mg by mouth daily., Disp: , Rfl:    B Complex CAPS, Take 1 tablet by mouth 2 (two) times daily., Disp: , Rfl:    budesonide (PULMICORT) 0.5 MG/2ML nebulizer solution, Take 2 mLs (0.5 mg total) by nebulization as needed., Disp: 120 mL, Rfl: 1   Cholecalciferol (VITAMIN D) 2000 UNITS tablet, Take 2,000 Units by mouth every other day. , Disp: , Rfl:    Coenzyme Q10 (COQ10) 150 MG CAPS, Take 150 mg by mouth 2 (two) times daily. , Disp: , Rfl:    enalapril (VASOTEC) 10 MG tablet, TAKE 1 TABLET(10 MG) BY MOUTH DAILY, Disp: 90 tablet, Rfl: 1   hydroxychloroquine (PLAQUENIL) 200 MG tablet, TK 1 T PO BID, Disp: , Rfl:    Krill Oil 1000 MG CAPS, Take 1 capsule by mouth daily., Disp: , Rfl:    MAG THREONATE-NIACINAMIDE ER PO, Take 800 mg by mouth 2 (two) times daily., Disp: , Rfl:    metFORMIN (GLUCOPHAGE) 500 MG tablet, TAKE 1 TABLET(500 MG) BY MOUTH TWICE DAILY WITH A MEAL, Disp: 180 tablet, Rfl: 0   Probiotic Product (PROBIOTIC DAILY PO), Take 1 capsule by mouth daily.,  Disp: , Rfl:    diclofenac sodium (VOLTAREN) 1 % GEL, Apply 2 g topically 4 (four) times daily. (Patient taking differently: Apply 2 g topically daily as needed (neck pain). ), Disp: 1 Tube, Rfl: 0  Current Facility-Administered Medications:    0.9 %  sodium chloride infusion, 500 mL, Intravenous, Once, Milus Banister, MD  Allergies  Allergen Reactions   Dilaudid [Hydromorphone Hcl] Other (See Comments)    Severe hypotension - pt states that if she is given this medication it will kill her   Naproxen Shortness Of Breath   Nsaids Shortness Of Breath and Other (See Comments)    Causes asthma   Percocet [Oxycodone-Acetaminophen] Shortness Of Breath    Can tolerate oxy IR   Latex Hives   Other Swelling    Eye drop preservative Specific agent unspecified   Rosuvastatin Other (See Comments)    myalgia   Morphine And Related     Pain with IM injection    I personally reviewed active problem list, medication list, allergies, family history, social history with the patient/caregiver today.   ROS  Ten systems reviewed and is negative except as mentioned in HPI   Objective  Virtual encounter, vitals not obtained.  There is no height or weight on file to calculate BMI.  Physical Exam  Awake, alert and oriented, well groomed  PHQ2/9: Depression screen [facility] 2/9 04/09/2019 10/09/2018 08/28/2018 07/07/2018 07/25/2017  Decreased Interest 0 0 0 0 0  Down, Depressed, Hopeless - 0 0 0 0  PHQ - 2 Score 0 0 0 0 0  Altered sleeping 0 0 0 0 -  Tired, decreased energy 0 0 0 0 -  Change in appetite 0 0 0 0 -  Feeling bad or failure about yourself  0 0 0 0 -  Trouble concentrating 0 0 0 0 -  Moving slowly or fidgety/restless 0 0 0 0 -  Suicidal thoughts 0 0 0 0 -  PHQ-9 Score 0 0 0 0 -  Difficult doing work/chores - Not difficult at all Not difficult at all  Not difficult at all -   PHQ-2/9 Result is negative.    Fall Risk: Fall Risk  10/09/2018 08/28/2018 07/07/2018 03/09/2018  07/25/2017  Falls in the past year? 0 No No No No  Risk for fall due to : - Impaired vision;History of fall(s) - - -  Risk for fall due to: Comment - wears eyeglasses - - -    Assessment & Plan  1. Hypertension, benign  - CBC with Differential/Platelet - Comprehensive metabolic panel - enalapril (VASOTEC) 10 MG tablet; Take 1 tablet (10 mg total) by mouth daily.  Dispense: 90 tablet; Refill: 1  2. Dyslipidemia  - Lipid panel  3. Obesity (BMI 30-39.9) Discussed with the patient the risk posed by an increased BMI. Discussed importance of portion control, calorie counting and at least 150 minutes of physical activity weekly. Avoid sweet beverages and drink more water. Eat at least 6 servings of fruit and vegetables daily   4. OSA (obstructive sleep apnea)   5. Carotid atherosclerosis, bilateral  Refuses statin therapy  6. Diabetes type II in obese (HCC)  - Hemoglobin A1c - Microalbumin / creatinine urine ratio - Comprehensive metabolic panel  7. History of aortic valve replacement  Sees vascular surgeon   8. History of hypothyroidism  - TSH  9. Mild intermittent asthma without complication  - montelukast (SINGULAIR) 10 MG tablet; Take 1 tablet (10 mg total) by mouth at bedtime.  Dispense: 90 tablet; Refill: 0  10. Anemia, unspecified type  - Iron, TIBC and Ferritin Panel  11. Polymyalgia rheumatica ([place])  Seeing Rheumatologist   I discussed the assessment and treatment plan with the patient. The patient was provided an opportunity to ask questions and all were answered. The patient agreed with the plan and demonstrated an understanding of the instructions.  The patient was advised to call back or seek an in-person evaluation if the symptoms worsen or if the condition fails to improve as anticipated.  I provided 30  minutes of non-face-to-face time during this encounter.

## 2019-05-26 DIAGNOSIS — K859 Acute pancreatitis without necrosis or infection, unspecified: Secondary | ICD-10-CM | POA: Insufficient documentation

## 2019-06-02 DIAGNOSIS — H6123 Impacted cerumen, bilateral: Secondary | ICD-10-CM | POA: Diagnosis not present

## 2019-06-08 ENCOUNTER — Telehealth: Payer: Self-pay | Admitting: Vascular Surgery

## 2019-06-08 NOTE — Telephone Encounter (Signed)
Patient mailbox is full. I called patient's daughter Durene Fruits at [phone].  She said she will call her and tell her to call our office to R/S her appt

## 2019-06-25 ENCOUNTER — Other Ambulatory Visit: Payer: Self-pay | Admitting: Family Medicine

## 2019-06-25 DIAGNOSIS — I10 Essential (primary) hypertension: Secondary | ICD-10-CM

## 2019-07-13 ENCOUNTER — Other Ambulatory Visit: Payer: Self-pay

## 2019-07-13 DIAGNOSIS — I6523 Occlusion and stenosis of bilateral carotid arteries: Secondary | ICD-10-CM

## 2019-07-15 ENCOUNTER — Other Ambulatory Visit (HOSPITAL_COMMUNITY): Payer: Self-pay

## 2019-07-16 ENCOUNTER — Encounter (HOSPITAL_COMMUNITY): Payer: Medicare Other

## 2019-07-16 ENCOUNTER — Ambulatory Visit: Payer: Medicare Other | Admitting: Vascular Surgery

## 2019-07-19 ENCOUNTER — Other Ambulatory Visit (HOSPITAL_COMMUNITY): Payer: Medicare Other

## 2019-07-26 ENCOUNTER — Other Ambulatory Visit: Payer: Self-pay

## 2019-07-26 ENCOUNTER — Ambulatory Visit (HOSPITAL_COMMUNITY): Payer: Medicare Other | Attending: Cardiology

## 2019-07-26 ENCOUNTER — Telehealth: Payer: Self-pay | Admitting: *Deleted

## 2019-07-26 DIAGNOSIS — I350 Nonrheumatic aortic (valve) stenosis: Secondary | ICD-10-CM

## 2019-07-26 NOTE — Telephone Encounter (Signed)
Left message that I would send comments from Dr. Angelena Form to her through my chart.  Left message to call or send message if she had any questions.

## 2019-07-26 NOTE — Telephone Encounter (Signed)
Pt replied through my chart.  She would like office visit. Appt scheduled for September 3 and message sent to pt

## 2019-07-26 NOTE — Telephone Encounter (Signed)
-----   Message from [name], MD sent at 07/26/2019 11:33 AM EDT ----- Bailey [name] you let her know that I reviewed the echo and it shows that her valve has worsened slightly. Her heart is still strong. Will need repeat echo in 6 months. I will be glad to see her in the office if she would prefer to discuss this in person. Gerald Stabs

## 2019-07-30 ENCOUNTER — Ambulatory Visit: Payer: Medicare Other | Admitting: Vascular Surgery

## 2019-07-30 ENCOUNTER — Encounter (HOSPITAL_COMMUNITY): Payer: Medicare Other

## 2019-08-06 ENCOUNTER — Ambulatory Visit: Payer: Medicare Other | Admitting: Vascular Surgery

## 2019-08-06 ENCOUNTER — Ambulatory Visit (HOSPITAL_COMMUNITY): Admission: RE | Admit: 2019-08-06 | Payer: Medicare Other | Source: Ambulatory Visit

## 2019-08-11 NOTE — Progress Notes (Deleted)
No chief complaint on file.   History of Present Illness: 73 yo female with history of HTN, HLD, DM, sleep apnea, aortic stenosis s/p AVR and carotid artery stenosis who is here today for cardiac follow up. She underwent AVR at [facility] in July  2013 with placement of a 21 mm bioprosthetic valve.  She had been followed at [facility] by Dr. Sharlet Salina until 2018 when she established in our office. Cardiac cath in 2013 with no evidence of CAD. Echo August 2019 with LVEF=55-60%, mild to moderate AS with mean gradient 22 mmHg. Her carotid artery disease is followed in the [facility]. She underwent left carotid endarterectomy in August 2019 per Dr. Donzetta Matters. Most recent echo 07/26/19 showed normal LV systolic function with [id number]. Mild mitral valve stenosis. The bioprosthetic AVR has evidence of stenosis with mean gradient 34 mmHg.   She is here today for follow up. The patient denies any chest pain, dyspnea, palpitations, lower extremity edema, orthopnea, PND, dizziness, near syncope or syncope.   Primary Care Physician: Steele Sizer, MD  Past Medical History:  Diagnosis Date  . Allergy   . Arthritis   . Asthma   . B12 deficiency anemia   . Blood transfusion without reported diagnosis   . H/O aortic valve replacement 2013   at [facility]   . History of colon polyps   . History of kidney stones   . HTN (hypertension)   . Hyperlipidemia   . Left nephrolithiasis   . OSA (obstructive sleep apnea)    STUDY 12 YRS CPAP RX-- BUT NONTOLERANT  . Right ureteral stone   . S/P aortic valve replacement    2013  AT [facility]--- CARDIOLOGIST--  DR CRAWFORD AT [facility]  . Seasonal allergies   . Sleep apnea    wears cpap   . Statin intolerance   . Type 2 diabetes mellitus ([place])    type 2    Past Surgical History:  Procedure Laterality Date  . AORTIC VALVE REPLACEMENT  06/2012   [facility]  . APPENDECTOMY  1958  . CHOLECYSTECTOMY  1978  . COLONOSCOPY  05-09-2014  . COLONOSCOPY    . CYSTOSCOPY/RETROGRADE/URETEROSCOPY Right  07/04/2014   Procedure: CYSTOSCOPY/RETROGRADE/URETEROSCOPY;  Surgeon: Bernestine Amass, MD;  Location: [facility];  Service: Urology;  Laterality: Right;  . ENDARTERECTOMY Left 08/05/2018   Procedure: ENDARTERECTOMY CAROTID LEFT;  Surgeon: Waynetta Sandy, MD;  Location: [facility];  Service: Vascular;  Laterality: Left;  . ESOPHAGOGASTRODUODENOSCOPY    . EXTRACORPOREAL SHOCK WAVE LITHOTRIPSY Right 06/ 2015     [place]  . HOLMIUM LASER APPLICATION Right 0000000   Procedure: HOLMIUM LASER APPLICATION;  Surgeon: Bernestine Amass, MD;  Location: [facility];  Service: Urology;  Laterality: Right;  . PATCH ANGIOPLASTY Left 08/05/2018   Procedure: PATCH ANGIOPLASTY USING XENOSURE BIOLOGIC PATCH 1cm x 6cm;  Surgeon: Waynetta Sandy, MD;  Location: [facility];  Service: Vascular;  Laterality: Left;  . POLYPECTOMY    . TONSILLECTOMY  1953  . UPPER GASTROINTESTINAL ENDOSCOPY      Current Outpatient Medications  Medication Sig Dispense Refill  . albuterol (PROAIR HFA) 108 (90 Base) MCG/ACT inhaler Inhale 2 puffs into the lungs every 4 (four) hours as needed for shortness of breath. 1 Inhaler 2  . amoxicillin (AMOXIL) 500 MG capsule Take 4 capsules by mouth 30 - 60 minutes prior to dental procedures 4 capsule 3  . Ascorbic Acid (VITAMIN C) 100 MG tablet Take 100 mg by mouth daily.    Marland Kitchen  aspirin EC 81 MG tablet Take 81 mg by mouth daily.    . B Complex CAPS Take 1 tablet by mouth 2 (two) times daily.    . budesonide (PULMICORT) 0.5 MG/2ML nebulizer solution Take 2 mLs (0.5 mg total) by nebulization as needed. 120 mL 1  . Cholecalciferol (VITAMIN D) 2000 UNITS tablet Take 2,000 Units by mouth every other day.     . Coenzyme Q10 (COQ10) 150 MG CAPS Take 150 mg by mouth 2 (two) times daily.     . diclofenac sodium (VOLTAREN) 1 % GEL Apply 2 g topically 4 (four) times daily. (Patient taking differently: Apply 2 g topically daily as needed (neck pain). ) 1 Tube 0  .  enalapril (VASOTEC) 10 MG tablet Take 1 tablet (10 mg total) by mouth daily. 90 tablet 1  . hydroxychloroquine (PLAQUENIL) 200 MG tablet TK 1 T PO BID    . Krill Oil 1000 MG CAPS Take 1 capsule by mouth daily.    Marland Kitchen MAG THREONATE-NIACINAMIDE ER PO Take 800 mg by mouth 2 (two) times daily.    . metFORMIN (GLUCOPHAGE) 500 MG tablet TAKE 1 TABLET(500 MG) BY MOUTH TWICE DAILY WITH A MEAL 180 tablet 0  . montelukast (SINGULAIR) 10 MG tablet Take 1 tablet (10 mg total) by mouth at bedtime. 90 tablet 0  . Probiotic Product (PROBIOTIC DAILY PO) Take 1 capsule by mouth daily.     No current facility-administered medications for this visit.     Allergies  Allergen Reactions  . Dilaudid [Hydromorphone Hcl] Other (See Comments)    Severe hypotension - pt states that if she is given this medication it will kill her  . Naproxen Shortness Of Breath  . Nsaids Shortness Of Breath and Other (See Comments)    Causes asthma  . Percocet [Oxycodone-Acetaminophen] Shortness Of Breath    Can tolerate oxy IR  . Latex Hives  . Other Swelling    Eye drop preservative Specific agent unspecified  . Rosuvastatin Other (See Comments)    myalgia  . Morphine And Related     Pain with IM injection    Social History   Socioeconomic History  . Marital status: Single    Spouse name: Not on file  . Number of children: 2  . Years of education: some college  . Highest education level: 12th grade  Occupational History  . Occupation: Works for [employer]-retired    Employer: [employer]  . Financial resource strain: Not hard at all  . Food insecurity    Worry: Never true    Inability: Never true  . Transportation needs    Medical: No    Non-medical: No  Tobacco Use  . Smoking status: Never Smoker  . Smokeless tobacco: Never Used  . Tobacco comment: smoking cessation materials not required  Substance and Sexual Activity  . Alcohol use: No    Alcohol/week: 0.0 standard drinks   . Drug use: No  . Sexual activity: Not Currently  Lifestyle  . Physical activity    Days per week: 3 days    Minutes per session: 50 min  . Stress: Not at all  Relationships  . Social connections    Talks on phone: More than three times a week    Gets together: More than three times a week    Attends religious service: More than 4 times per year    Active member of club or organization: Yes    Attends meetings  of clubs or organizations: More than 4 times per year    Relationship status: Divorced  . Intimate partner violence    Fear of current or ex partner: No    Emotionally abused: No    Physically abused: No    Forced sexual activity: No  Other Topics Concern  . Not on file  Social History Narrative   Divorced, 2 children. No longer working     Family History  Problem Relation Age of Onset  . Heart disease Mother   . Heart disease Father   . Asthma Daughter   . Asthma Maternal Grandfather   . Heart disease Sister        smoker  . Heart attack Sister 58  . Kidney cancer Sister   . Heart disease Sister        smoker  . Heart disease Sister        smoker  . Stomach cancer Maternal Grandmother   . Colon cancer Neg Hx   . Esophageal cancer Neg Hx   . Rectal cancer Neg Hx   . Colon polyps Neg Hx     Review of Systems:  As stated in the HPI and otherwise negative.   There were no vitals taken for this visit.  Physical Examination: General: Well developed, well nourished, NAD  HEENT: OP clear, mucus membranes moist  SKIN: warm, dry. No rashes. Neuro: No focal deficits  Musculoskeletal: Muscle strength 5/5 all ext  Psychiatric: Mood and affect normal  Neck: No JVD, no carotid bruits, no thyromegaly, no lymphadenopathy.  Lungs:Clear bilaterally, no wheezes, rhonci, crackles Cardiovascular: Regular rate and rhythm. No murmurs, gallops or rubs. Abdomen:Soft. Bowel sounds present. Non-tender.  Extremities: No lower extremity edema. Pulses are 2 + in the bilateral  DP/PT.  Echo 07/26/19:  1. The left ventricle has normal systolic function with an ejection fraction of 60-65%. The cavity size was normal. There is mildly increased left ventricular wall thickness. Left ventricular diastolic Doppler parameters are consistent with  pseudonormalization. Elevated left atrial and left ventricular end-diastolic pressures.  2. The right ventricle has normal systolic function. The cavity was normal. There is no increase in right ventricular wall thickness. Right ventricular systolic pressure could not be assessed.  3. Left atrial size was mildly dilated.  4. The mitral valve is degenerative. Mild thickening of the mitral valve leaflet. There is severe mitral annular calcification present. Mild mitral valve stenosis.  5. The aortic valve has an indeterminate number of cusps but likely tricuspid. Severe calcifcation of the aortic valve. There is Moderate to severe stenosis of the aortic valve, with a calculated valve area of 0.58 cm. AV Area (VTI): 0.58 cm. AV Mean  Grad: 34.0 mmHg. LVOT/AV VTI ratio: 0.22. Findings could be consistent with paradoxical low flow low gradient severe AS as the SV index low at 35.84.  FINDINGS  Left Ventricle: The left ventricle has normal systolic function, with an ejection fraction of 60-65%. The cavity size was normal. There is mildly increased left ventricular wall thickness. Left ventricular diastolic Doppler parameters are consistent  with pseudonormalization. Elevated left atrial and left ventricular end-diastolic pressures  Right Ventricle: The right ventricle has normal systolic function. The cavity was normal. There is no increase in right ventricular wall thickness. Right ventricular systolic pressure could not be assessed.  Left Atrium: Left atrial size was mildly dilated.  Right Atrium: Right atrial size was normal in size.  Interatrial Septum: No atrial level shunt detected by color flow Doppler.  Pericardium: There  is no evidence of pericardial effusion.  Mitral Valve: The mitral valve is degenerative in appearance. Mild thickening of the mitral valve leaflet. There is severe mitral annular calcification present. Mitral valve regurgitation is mild by color flow Doppler. Mild mitral valve stenosis.  Tricuspid Valve: The tricuspid valve is normal in structure. Tricuspid valve regurgitation was not visualized by color flow Doppler.  Aortic Valve: The aortic valve has an indeterminate number of cusps Severe calcifcation of the aortic valve. Aortic valve regurgitation was not visualized by color flow Doppler. There is Moderate-severe stenosis of the aortic valve, with a calculated  valve area of 0.58 cm.  Pulmonic Valve: The pulmonic valve was normal in structure. Pulmonic valve regurgitation is trivial by color flow Doppler.  Aorta: The aorta is normal unless otherwise noted.  Venous: The inferior vena cava is normal in size with greater than 50% respiratory variability.    +--------------+--------++ LEFT VENTRICLE         +----------------+---------++ +--------------+--------++ Diastology                PLAX 2D                +----------------+---------++ +--------------+--------++ LV e' lateral:  6.53 cm/s LVIDd:        4.30 cm  +----------------+---------++ +--------------+--------++ LV E/e' lateral:23.4      LVIDs:        2.30 cm  +----------------+---------++ +--------------+--------++ LV e' medial:   7.18 cm/s LV PW:        1.20 cm  +----------------+---------++ +--------------+--------++ LV E/e' medial: 21.3      LV IVS:       1.10 cm  +----------------+---------++ +--------------+--------++ LVOT diam:    1.83 cm  +--------------+--------++ LV SV:        65 ml    +--------------+--------++ LV SV Index:  35.84    +--------------+--------++ LVOT Area:    2.64 cm +--------------+--------++                         +--------------+--------++  +---------------+---------++ RIGHT VENTRICLE          +---------------+---------++ RV Basal diam: 2.11 cm   +---------------+---------++ RV S prime:    9.79 cm/s +---------------+---------++ TAPSE (M-mode):1.6 cm    +---------------+---------++  +-------------+-------++-----------++ LEFT ATRIUM         Index       +-------------+-------++-----------++ LA diam:     4.20 cm2.42 cm/m  +-------------+-------++-----------++ LA Vol (A2C):51.4 ml29.58 ml/m +-------------+-------++-----------++ LA Vol (A4C):64.2 ml36.95 ml/m +-------------+-------++-----------++ +------------+---------++----------++ RIGHT ATRIUM         Index      +------------+---------++----------++ RA Pressure:3.00 mmHg           +------------+---------++----------++ RA Area:    9.01 cm            +------------+---------++----------++ RA Volume:  15.90 ml 9.15 ml/m +------------+---------++----------++  +------------------+------------++ AORTIC VALVE                   +------------------+------------++ AV Area (Vmax):   0.62 cm     +------------------+------------++ AV Area (Vmean):  0.54 cm     +------------------+------------++ AV Area (VTI):    0.58 cm     +------------------+------------++ AV Vmax:          371.50 cm/s  +------------------+------------++ AV Vmean:         279.000 cm/s +------------------+------------++ AV VTI:           0.966 m      +------------------+------------++  AV Peak Grad:     55.2 mmHg    +------------------+------------++ AV Mean Grad:     34.0 mmHg    +------------------+------------++ LVOT Vmax:        86.70 cm/s   +------------------+------------++ LVOT Vmean:       57.400 cm/s  +------------------+------------++ LVOT VTI:         0.214 m      +------------------+------------++ LVOT/AV VTI ratio:0.22          +------------------+------------++  +--------------+-----------++ +---------------+---------++ MITRAL VALVE              TRICUSPID VALVE          +--------------+-----------++ +---------------+---------++ MV Area (PHT):2.01 cm    Estimated RAP: 3.00 mmHg +--------------+-----------++ +---------------+---------++ MV Peak grad: 13.5 mmHg   +--------------+-----------++ +--------------+-------+ MV Mean grad: 4.0 mmHg    SHUNTS                +--------------+-----------++ +--------------+-------+ MV Vmax:      1.84 m/s    Systemic VTI: 0.21 m  +--------------+-----------++ +--------------+-------+ MV Vmean:     88.5 cm/s   Systemic Diam:1.83 cm +--------------+-----------++ +--------------+-------+ MV VTI:       0.57 m      +--------------+-----------++ MV PHT:       109.33 msec +--------------+-----------++ MV Decel Time:377 msec    +--------------+-----------++ +-------------+-----------++ MR Peak grad:119.7 mmHg  +-------------+-----------++ MR Mean grad:72.0 mmHg   +-------------+-----------++ MR Vmax:     547.00 cm/s +-------------+-----------++ MR Vmean:    392.0 cm/s  +-------------+-----------++ +--------------+-----------++ MV E velocity:153.00 cm/s +--------------+-----------++ MV A velocity:90.80 cm/s  +--------------+-----------++ MV E/A ratio: 1.69        +--------------+-----------++   EKG:  EKG is not *** ordered today. The ekg ordered today demonstrates   Recent Labs: 08/12/2018: BUN 10; Creatinine, Ser 0.64; Hemoglobin 10.1; Platelets 259; Potassium 3.9; Sodium 144   Lipid Panel    Component Value Date/Time   CHOL 217 (H) 03/09/2018 0849   CHOL 225 (H) 08/24/2015 1023   TRIG 130 03/09/2018 0849   HDL 58 03/09/2018 0849   HDL 45 08/24/2015 1023   CHOLHDL 3.7 03/09/2018 0849   VLDL 24 08/21/2016 1004   LDLCALC 134 (H) 03/09/2018 0849   LDLDIRECT 162.7 06/18/2007 1534      Wt Readings from Last 3 Encounters:  04/09/19 162 lb (73.5 kg)  01/01/19 168 lb (76.2 kg)  12/23/18 166 lb (75.3 kg)     Other studies Reviewed: Additional studies/ records that were reviewed today include: . Review of the above records demonstrates:   Assessment and Plan:   1. Aortic valve stenosis: She is s/p aortic valve replacement with placement of a 21 mm bioprosthetic AVR at [facility] in 2013. Moderate AS by echo August 2020. She has no dyspnea or signs of CHF. Will repeat echo in six months. Continue ASA and SBE prophylaxis.    2. HTN: BP is controlled. No changes today  3. Carotid artery stenosis: s/p left carotid endarterectomy August 2019. [facility]ed [facility].   4. Hyperlipidemia: She is intolerant of statins. I have asked her to consider Praluent or Repatha given carotid artery disease. *** She is discussing with her primary care doctor.   Current medicines are reviewed at length with the patient today.  The patient does not have concerns regarding medicines.  The following changes have been made:  no change  Labs/ tests ordered today include:   No orders of the defined types were placed in this encounter.  Disposition:   FU with me in 6 months  Signed, Lauree Chandler, MD 08/11/2019 3:33 PM    [facility] [address], [place], [place]  [address] Phone: [phone]; Fax: [phone]

## 2019-08-12 ENCOUNTER — Ambulatory Visit: Payer: Medicare Other | Admitting: Cardiovascular Disease

## 2019-08-12 ENCOUNTER — Telehealth: Payer: Self-pay | Admitting: Cardiovascular Disease

## 2019-08-12 NOTE — Telephone Encounter (Signed)
Pt did not show for appt today.   Bailey [name]

## 2019-09-02 DIAGNOSIS — H15102 Unspecified episcleritis, left eye: Secondary | ICD-10-CM | POA: Diagnosis not present

## 2019-09-08 DIAGNOSIS — H6123 Impacted cerumen, bilateral: Secondary | ICD-10-CM | POA: Diagnosis not present

## 2019-09-23 LAB — HM DIABETES EYE EXAM

## 2019-09-24 ENCOUNTER — Encounter: Payer: Self-pay | Admitting: Family Medicine

## 2019-09-24 DIAGNOSIS — H5203 Hypermetropia, bilateral: Secondary | ICD-10-CM | POA: Diagnosis not present

## 2019-09-24 DIAGNOSIS — H2513 Age-related nuclear cataract, bilateral: Secondary | ICD-10-CM | POA: Diagnosis not present

## 2019-09-24 DIAGNOSIS — E119 Type 2 diabetes mellitus without complications: Secondary | ICD-10-CM | POA: Diagnosis not present

## 2019-09-24 DIAGNOSIS — H25013 Cortical age-related cataract, bilateral: Secondary | ICD-10-CM | POA: Diagnosis not present

## 2019-10-01 ENCOUNTER — Telehealth: Payer: Self-pay | Admitting: Family Medicine

## 2019-10-01 NOTE — Chronic Care Management (AMB) (Signed)
Chronic Care Management   Note  10/01/2019 Name: IRETA PULLMAN MRN: [id number] DOB: 06/23/1946  ARRYN TERRONES is a 73 y.o. year old female who is a primary care patient of Steele Sizer, MD. I reached out to [name] by phone today in response to a referral sent by Ms. Oletta Lamas [employer]'s health plan.     Ms. Ebey was given information about Chronic Care Management services today including:  1. CCM service includes personalized support from designated clinical staff supervised by her physician, including individualized plan of care and coordination with other care providers 2. 24/7 contact phone numbers for assistance for urgent and routine care needs. 3. Service will only be billed when office clinical staff spend 20 minutes or more in a month to coordinate care. 4. Only one practitioner may furnish and bill the service in a calendar month. 5. The patient may stop CCM services at any time (effective at the end of the month) by phone call to the office staff. 6. The patient will be responsible for cost sharing (co-pay) of up to 20% of the service fee (after annual deductible is met).  Patient did not agree to enrollment in care management services and does not wish to consider at this time.  Follow up plan: The patient has been provided with contact information for the chronic care management team and has been advised to call with any health related questions or concerns.   [place]  ??[email or web address]   ??[phone]

## 2019-10-08 ENCOUNTER — Other Ambulatory Visit: Payer: Self-pay

## 2019-10-08 DIAGNOSIS — Z20822 Contact with and (suspected) exposure to covid-19: Secondary | ICD-10-CM

## 2019-10-08 DIAGNOSIS — Z20828 Contact with and (suspected) exposure to other viral communicable diseases: Secondary | ICD-10-CM | POA: Diagnosis not present

## 2019-10-10 LAB — NOVEL CORONAVIRUS, NAA: SARS-CoV-2, NAA: NOT DETECTED

## 2019-10-13 ENCOUNTER — Ambulatory Visit: Payer: Medicare Other | Admitting: Family Medicine

## 2019-10-22 ENCOUNTER — Other Ambulatory Visit: Payer: Self-pay

## 2019-10-22 ENCOUNTER — Ambulatory Visit (INDEPENDENT_AMBULATORY_CARE_PROVIDER_SITE_OTHER): Payer: Medicare Other | Admitting: Vascular Surgery

## 2019-10-22 ENCOUNTER — Ambulatory Visit (HOSPITAL_COMMUNITY)
Admission: RE | Admit: 2019-10-22 | Discharge: 2019-10-22 | Disposition: A | Discharge status: Home / Self Care | Payer: Medicare Other | Source: Ambulatory Visit | Attending: Vascular Surgery | Admitting: Vascular Surgery

## 2019-10-22 ENCOUNTER — Encounter: Payer: Self-pay | Admitting: Vascular Surgery

## 2019-10-22 VITALS — BP 109/83 | HR 69 | Temp 97.7°F | Resp 20 | Ht 61.5 in | Wt 154.5 lb

## 2019-10-22 DIAGNOSIS — I10 Essential (primary) hypertension: Secondary | ICD-10-CM

## 2019-10-22 DIAGNOSIS — Z9889 Other specified postprocedural states: Secondary | ICD-10-CM

## 2019-10-22 DIAGNOSIS — I6523 Occlusion and stenosis of bilateral carotid arteries: Secondary | ICD-10-CM

## 2019-10-22 NOTE — Progress Notes (Signed)
Patient ID: [name], female   DOB: [birth year], 73 y.o.   MRN: [id number]  Reason for Consult: Follow-up   Referred by Steele Sizer, MD  Subjective:     HPI:  ARSHIYA PENMAN is a 73 y.o. female now just over 1 year out from left carotid endarterectomy for asymptomatic disease.  She has had moderate recurrent stenosis in the left for which we have followed.  She returns today with duplex.  She has been doing very well overall and has been losing weight staying very active.  She denies any history of TIA, stroke, amaurosis.  She remains on aspirin.  She cannot take statin given previous intolerance.  Overall doing well without complaints today.  Past Medical History:  Diagnosis Date  . Allergy   . Arthritis   . Asthma   . B12 deficiency anemia   . Blood transfusion without reported diagnosis   . H/O aortic valve replacement 2013   at [facility]   . History of colon polyps   . History of kidney stones   . HTN (hypertension)   . Hyperlipidemia   . Left nephrolithiasis   . OSA (obstructive sleep apnea)    STUDY 12 YRS CPAP RX-- BUT NONTOLERANT  . Right ureteral stone   . S/P aortic valve replacement    2013  AT [facility]--- CARDIOLOGIST--  DR CRAWFORD [facility]  . Seasonal allergies   . Sleep apnea    wears cpap   . Statin intolerance   . Type 2 diabetes mellitus (HCC)    type 2   Family History  Problem Relation Age of Onset  . Heart disease Mother   . Heart disease Father   . Asthma Daughter   . Asthma Maternal Grandfather   . Heart disease Sister        smoker  . Heart attack Sister 63  . Kidney cancer Sister   . Heart disease Sister        smoker  . Heart disease Sister        smoker  . Stomach cancer Maternal Grandmother   . Colon cancer Neg Hx   . Esophageal cancer Neg Hx   . Rectal cancer Neg Hx   . Colon polyps Neg Hx    Past Surgical History:  Procedure Laterality Date  . AORTIC VALVE REPLACEMENT  06/2012   [facility]  . APPENDECTOMY  1958  . CHOLECYSTECTOMY   1978  . COLONOSCOPY  05-09-2014  . COLONOSCOPY    . CYSTOSCOPY/RETROGRADE/URETEROSCOPY Right 07/04/2014   Procedure: CYSTOSCOPY/RETROGRADE/URETEROSCOPY;  Surgeon: Bernestine Amass, MD;  Location: [facility];  Service: Urology;  Laterality: Right;  . ENDARTERECTOMY Left 08/05/2018   Procedure: ENDARTERECTOMY CAROTID LEFT;  Surgeon: Waynetta Sandy, MD;  Location: [place];  Service: Vascular;  Laterality: Left;  . ESOPHAGOGASTRODUODENOSCOPY    . EXTRACORPOREAL SHOCK WAVE LITHOTRIPSY Right 06/ 2015     [place]  . HOLMIUM LASER APPLICATION Right 0000000   Procedure: HOLMIUM LASER APPLICATION;  Surgeon: Bernestine Amass, MD;  Location: [facility];  Service: Urology;  Laterality: Right;  . PATCH ANGIOPLASTY Left 08/05/2018   Procedure: PATCH ANGIOPLASTY USING XENOSURE BIOLOGIC PATCH 1cm x 6cm;  Surgeon: Waynetta Sandy, MD;  Location: [place];  Service: Vascular;  Laterality: Left;  . POLYPECTOMY    . TONSILLECTOMY  1953  . UPPER GASTROINTESTINAL ENDOSCOPY      Short Social History:  Social History   Tobacco Use  . Smoking  status: Never Smoker  . Smokeless tobacco: Never Used  . Tobacco comment: smoking cessation materials not required  Substance Use Topics  . Alcohol use: No    Alcohol/week: 0.0 standard drinks    Allergies  Allergen Reactions  . Dilaudid [Hydromorphone Hcl] Other (See Comments)    Severe hypotension - pt states that if she is given this medication it will kill her  . Naproxen Shortness Of Breath  . Nsaids Shortness Of Breath and Other (See Comments)    Causes asthma  . Percocet [Oxycodone-Acetaminophen] Shortness Of Breath    Can tolerate oxy IR  . Latex Hives  . Other Swelling    Eye drop preservative Specific agent unspecified  . Rosuvastatin Other (See Comments)    myalgia  . Morphine And Related     Pain with IM injection    Current Outpatient Medications  Medication Sig Dispense Refill  . albuterol  (PROAIR HFA) 108 (90 Base) MCG/ACT inhaler Inhale 2 puffs into the lungs every 4 (four) hours as needed for shortness of breath. 1 Inhaler 2  . amoxicillin (AMOXIL) 500 MG capsule Take 4 capsules by mouth 30 - 60 minutes prior to dental procedures 4 capsule 3  . Ascorbic Acid (VITAMIN C) 100 MG tablet Take 100 mg by mouth daily.    Marland Kitchen aspirin EC 81 MG tablet Take 81 mg by mouth daily.    . B Complex CAPS Take 1 tablet by mouth 2 (two) times daily.    . budesonide (PULMICORT) 0.5 MG/2ML nebulizer solution Take 2 mLs (0.5 mg total) by nebulization as needed. 120 mL 1  . Cholecalciferol (VITAMIN D) 2000 UNITS tablet Take 2,000 Units by mouth every other day.     . Coenzyme Q10 (COQ10) 150 MG CAPS Take 150 mg by mouth 2 (two) times daily.     . enalapril (VASOTEC) 10 MG tablet Take 1 tablet (10 mg total) by mouth daily. 90 tablet 1  . hydroxychloroquine (PLAQUENIL) 200 MG tablet TK 1 T PO BID    . Krill Oil 1000 MG CAPS Take 1 capsule by mouth daily.    Marland Kitchen MAG THREONATE-NIACINAMIDE ER PO Take 800 mg by mouth 2 (two) times daily.    . metFORMIN (GLUCOPHAGE) 500 MG tablet TAKE 1 TABLET(500 MG) BY MOUTH TWICE DAILY WITH A MEAL 180 tablet 0  . montelukast (SINGULAIR) 10 MG tablet Take 1 tablet (10 mg total) by mouth at bedtime. 90 tablet 0  . Probiotic Product (PROBIOTIC DAILY PO) Take 1 capsule by mouth daily.    . diclofenac sodium (VOLTAREN) 1 % GEL Apply 2 g topically 4 (four) times daily. (Patient not taking: Reported on 10/22/2019) 1 Tube 0   No current facility-administered medications for this visit.     Review of Systems  Constitutional:  Constitutional negative. HENT: HENT negative.  Eyes: Eyes negative.  Respiratory: Respiratory negative.  Cardiovascular: Cardiovascular negative.  GI: Gastrointestinal negative.  Musculoskeletal: Musculoskeletal negative.  Skin: Skin negative.  Neurological: Neurological negative. Hematologic: Hematologic/lymphatic negative.  Psychiatric: Psychiatric  negative.        Objective:  Objective   Vitals:   10/22/19 1159  BP: 109/83  Pulse: 69  Resp: 20  Temp: 97.7 F (36.5 C)  SpO2: 100%  Weight: 154 lb 8 oz (70.1 kg)  Height: 5' 1.5" (1.562 m)   Body mass index is 28.72 kg/m.  Physical Exam HENT:     Head: Normocephalic.     Nose: Nose normal.     Mouth/Throat:  Mouth: Mucous membranes are moist.  Eyes:     Pupils: Pupils are equal, round, and reactive to light.  Neck:     Vascular: No carotid bruit.  Cardiovascular:     Rate and Rhythm: Normal rate and regular rhythm.  Pulmonary:     Effort: Pulmonary effort is normal.  Abdominal:     General: Abdomen is flat.     Palpations: Abdomen is soft.  Musculoskeletal: Normal range of motion.  Skin:    General: Skin is warm.     Capillary Refill: Capillary refill takes less than 2 seconds.  Neurological:     General: No focal deficit present.     Mental Status: She is alert.  Psychiatric:        Mood and Affect: Mood normal.        Behavior: Behavior normal.        Thought Content: Thought content normal.        Judgment: Judgment normal.     Data: Independently interpreted her carotid artery duplex which demonstrates 40 to 59% stenosis on the right with peak systolic velocity 96.  On the left peak systolic velocity 96 and 40 to 59% stenosis as well at the site of the endarterectomy.     Assessment/Plan:     73 year old female has undergone left carotid endarterectomy.  She has had some recurrent stenosis although this is now stable.  She remains on aspirin and cannot take statin.  She has lost weight recently overall doing very well no complaints today.  We will follow-up in 6 months with repeat duplex.  If at that time she is stable from a disease recurrence standpoint we will put her out to 1 year.  I discussed possible results with her she demonstrates good understanding we will see her in 6 months should she not have issues prior.     Waynetta Sandy MD [facility]

## 2019-10-26 ENCOUNTER — Other Ambulatory Visit: Payer: Self-pay

## 2019-10-26 DIAGNOSIS — I6522 Occlusion and stenosis of left carotid artery: Secondary | ICD-10-CM

## 2019-10-26 DIAGNOSIS — I6523 Occlusion and stenosis of bilateral carotid arteries: Secondary | ICD-10-CM

## 2019-11-01 ENCOUNTER — Other Ambulatory Visit: Payer: Self-pay

## 2019-11-01 DIAGNOSIS — Z20822 Contact with and (suspected) exposure to covid-19: Secondary | ICD-10-CM

## 2019-11-03 ENCOUNTER — Other Ambulatory Visit: Payer: Self-pay

## 2019-11-03 LAB — NOVEL CORONAVIRUS, NAA: SARS-CoV-2, NAA: NOT DETECTED

## 2019-11-17 ENCOUNTER — Encounter: Payer: Self-pay | Admitting: Family Medicine

## 2019-11-17 ENCOUNTER — Ambulatory Visit (INDEPENDENT_AMBULATORY_CARE_PROVIDER_SITE_OTHER): Payer: Medicare Other | Admitting: Family Medicine

## 2019-11-17 ENCOUNTER — Other Ambulatory Visit: Payer: Self-pay

## 2019-11-17 DIAGNOSIS — E1169 Type 2 diabetes mellitus with other specified complication: Secondary | ICD-10-CM

## 2019-11-17 DIAGNOSIS — M353 Polymyalgia rheumatica: Secondary | ICD-10-CM

## 2019-11-17 DIAGNOSIS — E785 Hyperlipidemia, unspecified: Secondary | ICD-10-CM

## 2019-11-17 DIAGNOSIS — G4733 Obstructive sleep apnea (adult) (pediatric): Secondary | ICD-10-CM

## 2019-11-17 DIAGNOSIS — Z8639 Personal history of other endocrine, nutritional and metabolic disease: Secondary | ICD-10-CM

## 2019-11-17 DIAGNOSIS — Z952 Presence of prosthetic heart valve: Secondary | ICD-10-CM

## 2019-11-17 DIAGNOSIS — I6523 Occlusion and stenosis of bilateral carotid arteries: Secondary | ICD-10-CM | POA: Diagnosis not present

## 2019-11-17 DIAGNOSIS — E669 Obesity, unspecified: Secondary | ICD-10-CM | POA: Diagnosis not present

## 2019-11-17 DIAGNOSIS — Z1231 Encounter for screening mammogram for malignant neoplasm of breast: Secondary | ICD-10-CM

## 2019-11-17 DIAGNOSIS — I10 Essential (primary) hypertension: Secondary | ICD-10-CM | POA: Diagnosis not present

## 2019-11-17 DIAGNOSIS — J4520 Mild intermittent asthma, uncomplicated: Secondary | ICD-10-CM | POA: Diagnosis not present

## 2019-11-17 MED ORDER — METFORMIN HCL 500 MG PO TABS: 500.0000 mg | ORAL_TABLET | Freq: Two times a day (BID) | ORAL | 0 refills | Status: DC

## 2019-11-17 MED ORDER — MONTELUKAST SODIUM 10 MG PO TABS: 10.0000 mg | ORAL_TABLET | Freq: Every day | ORAL | 0 refills | Status: DC

## 2019-11-17 MED ORDER — BUDESONIDE 0.5 MG/2ML IN SUSP: 0.5000 mg | RESPIRATORY_TRACT | 1 refills | Status: DC | PRN

## 2019-11-17 MED ORDER — ENALAPRIL MALEATE 10 MG PO TABS: 10.0000 mg | ORAL_TABLET | Freq: Every day | ORAL | 1 refills | Status: DC

## 2019-11-17 NOTE — Progress Notes (Signed)
Name: Bailey [name]   MRN: [id number]    DOB: [birth year]   Date:11/17/2019       Progress Note  Subjective  Chief Complaint  Chief Complaint  Patient presents with  . Diabetes  . Hypothyroidism  . Hypertension  . Asthma  . Dyslipidemia    I connected with  [name]  on 11/17/19 at  9:20 AM EST by a video enabled telemedicine application and verified that I am speaking with the correct person using two identifiers.  I discussed the limitations of evaluation and management by telemedicine and the availability of in person appointments. The patient expressed understanding and agreed to proceed. Staff also discussed with the patient that there may be a patient responsible charge related to this service. Patient Location: at home  Provider Location: [facility]   HPI   DMII: shehas lost weight since last visit, doing intermittent fasting .She has not been checking her glucose at home but denies polyphagia, polydipsia or polyuria.Last hgbA1C was over 6 months ago due to COVID-19. She will have it done at [place]. She has been physically active, walking daily. On Ace for kidney protection, dyslipidemia - she only believes in fish oil, refuses statin therapy.Discussed PKS9 and also Ezetimide but she is not interested She only seldom takes Metformin   Asthma Mild : she is usesPulmicort prn, she was exposed to peppermint oil and had a flare recently, she states her neb solution are expired. Doing well today, no cough, wheezing or SOB  HTN: taking medication and denies side effects of medication, bp at recent visit with vascular surgeon bp was towards low end of normal. No dizziness, she had some chest pain this past Summer but seen by cardiologist had repeat Echo that showed mild progression of aortic stenosis but no problems recently   Obesity: sheis physically active, she lost 13 lbs since last year, she is trying to stay active and intermittent fasting  from noon to 4 pm ( 18 -20 hours fasting period ) . She is trying to avoid red meat. She likes cabbage, beans , humus and apples, walking 2 miles a day , she was also lifting but had some neck pain but will resume again   Dyslipidemia: unable to tolerate statin therapy, she is taking fish oil only. She is aware of importance of medication, she refuses Zetia of PKS9 injection.Unchanged  History of carotid endarterectomy left side: recent visit with vascular surgeon showed carotid artery duplex which demonstrates 40 to 59% stenosis on the right with peak systolic velocity 96. On the left peak systolic velocity 96 and 40 to 59% stenosis as well at the site of the endarterectomy.  Hearing loss: seen by ENT, needs hearing aid but can't afford it at this time. 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 She states no longer needs to go. She states her aches and pain are stable. She had [place] of her wrist and it was very expensive .  Patient Active Problem List   Diagnosis Date Noted  . Recurrent pancreatitis 05/26/2019  . Left carotid artery stenosis 08/05/2018  . OSA (obstructive sleep apnea) 01/20/2017  . Mild cognitive impairment 01/20/2017  . Carotid atherosclerosis, bilateral 01/20/2017  . Abnormal brain MRI 08/26/2016  . Inflammatory polyarthritis ([place]) 11/12/2015  . Erosive osteoarthritis of hand 11/12/2015  . History of vitamin D deficiency 11/06/2015  . Hearing loss 08/24/2015  . Asthma, mild  intermittent 08/23/2015  . Arthritis, degenerative 08/23/2015  . Dyslipidemia 08/23/2015  . H/O: hypothyroidism 08/23/2015  . Obesity (BMI 30-39.9) 08/23/2015  . HZV (herpes zoster virus) post herpetic neuralgia 08/23/2015  . History of renal stone 04/01/2014  . Diabetes mellitus, controlled ([place]) 04/01/2014  . Essential hypertension, benign 04/01/2014  . Aortic stenosis 06/16/2012  . H/O prosthetic heart  valve 06/15/2012  . CD (contact dermatitis) 07/25/2008    Past Surgical History:  Procedure Laterality Date  . AORTIC VALVE REPLACEMENT  06/2012   [facility]  . APPENDECTOMY  1958  . CHOLECYSTECTOMY  1978  . COLONOSCOPY  05-09-2014  . COLONOSCOPY    . CYSTOSCOPY/RETROGRADE/URETEROSCOPY Right 07/04/2014   Procedure: CYSTOSCOPY/RETROGRADE/URETEROSCOPY;  Surgeon: Bernestine Amass, MD;  Location: [facility];  Service: Urology;  Laterality: Right;  . ENDARTERECTOMY Left 08/05/2018   Procedure: ENDARTERECTOMY CAROTID LEFT;  Surgeon: Waynetta Sandy, MD;  Location: [place];  Service: Vascular;  Laterality: Left;  . ESOPHAGOGASTRODUODENOSCOPY    . EXTRACORPOREAL SHOCK WAVE LITHOTRIPSY Right 06/ 2015     [place]  . HOLMIUM LASER APPLICATION Right 0000000   Procedure: HOLMIUM LASER APPLICATION;  Surgeon: Bernestine Amass, MD;  Location[facility];  Service: Urology;  Laterality: Right;  . PATCH ANGIOPLASTY Left 08/05/2018   Procedure: PATCH ANGIOPLASTY USING XENOSURE BIOLOGIC PATCH 1cm x 6cm;  Surgeon: Waynetta Sandy, MD;  Location: [place];  Service: Vascular;  Laterality: Left;  . POLYPECTOMY    . TONSILLECTOMY  1953  . UPPER GASTROINTESTINAL ENDOSCOPY      Family History  Problem Relation Age of Onset  . Heart disease Mother   . Heart disease Father   . Asthma Daughter   . Asthma Maternal Grandfather   . Heart disease Sister        smoker  . Heart attack Sister 55  . Kidney cancer Sister   . Heart disease Sister        smoker  . Heart disease Sister        smoker  . Stomach cancer Maternal Grandmother   . Colon cancer Neg Hx   . Esophageal cancer Neg Hx   . Rectal cancer Neg Hx   . Colon polyps Neg Hx     Social History   Socioeconomic History  . Marital status: Single    Spouse name: Not on file  . Number of children: 2  . Years of education: some college  . Highest education level: 12th grade  Occupational History  .  Occupation: Works for [employer]-retired    Employer: [employer]  . Financial resource strain: Not hard at all  . Food insecurity    Worry: Never true    Inability: Never true  . Transportation needs    Medical: No    Non-medical: No  Tobacco Use  . Smoking status: Never Smoker  . Smokeless tobacco: Never Used  . Tobacco comment: smoking cessation materials not required  Substance and Sexual Activity  . Alcohol use: No    Alcohol/week: 0.0 standard drinks  . Drug use: No  . Sexual activity: Not Currently  Lifestyle  . Physical activity    Days per week: 3 days    Minutes per session: 50 min  . Stress: Not at all  Relationships  . Social connections    Talks on phone: More than three times a week    Gets together: More than three times a week  Attends religious service: More than 4 times per year    Active member of club or organization: Yes    Attends meetings of clubs or organizations: More than 4 times per year    Relationship status: Divorced  . Intimate partner violence    Fear of current or ex partner: No    Emotionally abused: No    Physically abused: No    Forced sexual activity: No  Other Topics Concern  . Not on file  Social History Narrative   Divorced, 2 children. No longer working      Current Outpatient Medications:  .  albuterol (PROAIR HFA) 108 (90 Base) MCG/ACT inhaler, Inhale 2 puffs into the lungs every 4 (four) hours as needed for shortness of breath., Disp: 1 Inhaler, Rfl: 2 .  amoxicillin (AMOXIL) 500 MG capsule, Take 4 capsules by mouth 30 - 60 minutes prior to dental procedures, Disp: 4 capsule, Rfl: 3 .  Ascorbic Acid (VITAMIN C) 100 MG tablet, Take 100 mg by mouth daily., Disp: , Rfl:  .  aspirin EC 81 MG tablet, Take 81 mg by mouth daily., Disp: , Rfl:  .  B Complex CAPS, Take 1 tablet by mouth 2 (two) times daily., Disp: , Rfl:  .  budesonide (PULMICORT) 0.5 MG/2ML nebulizer solution, Take 2 mLs (0.5 mg  total) by nebulization as needed., Disp: 120 mL, Rfl: 1 .  Cholecalciferol (VITAMIN D) 2000 UNITS tablet, Take 2,000 Units by mouth every other day. , Disp: , Rfl:  .  Coenzyme Q10 (COQ10) 150 MG CAPS, Take 150 mg by mouth 2 (two) times daily. , Disp: , Rfl:  .  diclofenac sodium (VOLTAREN) 1 % GEL, Apply 2 g topically 4 (four) times daily., Disp: 1 Tube, Rfl: 0 .  enalapril (VASOTEC) 10 MG tablet, Take 1 tablet (10 mg total) by mouth daily., Disp: 90 tablet, Rfl: 1 .  Krill Oil 1000 MG CAPS, Take 1 capsule by mouth daily., Disp: , Rfl:  .  MAG THREONATE-NIACINAMIDE ER PO, Take 800 mg by mouth 2 (two) times daily., Disp: , Rfl:  .  metFORMIN (GLUCOPHAGE) 500 MG tablet, TAKE 1 TABLET(500 MG) BY MOUTH TWICE DAILY WITH A MEAL, Disp: 180 tablet, Rfl: 0 .  montelukast (SINGULAIR) 10 MG tablet, Take 1 tablet (10 mg total) by mouth at bedtime., Disp: 90 tablet, Rfl: 0 .  Probiotic Product (PROBIOTIC DAILY PO), Take 1 capsule by mouth daily., Disp: , Rfl:   Allergies  Allergen Reactions  . Dilaudid [Hydromorphone Hcl] Other (See Comments)    Severe hypotension - pt states that if she is given this medication it will kill her  . Naproxen Shortness Of Breath  . Nsaids Shortness Of Breath and Other (See Comments)    Causes asthma  . Percocet [Oxycodone-Acetaminophen] Shortness Of Breath    Can tolerate oxy IR  . Latex Hives  . Other Swelling    Eye drop preservative Specific agent unspecified  . Rosuvastatin Other (See Comments)    myalgia  . Morphine And Related     Pain with IM injection    I personally reviewed active problem list, medication list, allergies, family history, social history, health maintenance with the patient/caregiver today.   ROS  Ten systems reviewed and is negative except as mentioned in HPI   Objective  Virtual encounter, vitals not obtained.  There is no height or weight on file to calculate BMI.  Physical Exam  Awake, alert and oriented  PHQ2/9:  Depression screen PHQ  2/9 11/17/2019 04/09/2019 10/09/2018 08/28/2018 07/07/2018  Decreased Interest 0 0 0 0 0  Down, Depressed, Hopeless 0 - 0 0 0  PHQ - 2 Score 0 0 0 0 0  Altered sleeping 0 0 0 0 0  Tired, decreased energy 0 0 0 0 0  Change in appetite 0 0 0 0 0  Feeling bad or failure about yourself  0 0 0 0 0  Trouble concentrating 0 0 0 0 0  Moving slowly or fidgety/restless 0 0 0 0 0  Suicidal thoughts 0 0 0 0 0  PHQ-9 Score 0 0 0 0 0  Difficult doing work/chores - - Not difficult at all Not difficult at all Not difficult at all   PHQ-2/9 Result is negative.    Fall Risk: Fall Risk  11/17/2019 10/09/2018 08/28/2018 07/07/2018 03/09/2018  Falls in the past year? 0 0 No No No  Number falls in past yr: 0 - - - -  Injury with Fall? 0 - - - -  Risk for fall due to : - - Impaired vision;History of fall(s) - -  Risk for fall due to: Comment - - wears eyeglasses - -    Assessment & Plan  1. Dyslipidemia associated with type 2 diabetes mellitus ([place])  Continue metformin   2. Dyslipidemia  Not on statin therapy   3. Hypertension, benign  - enalapril (VASOTEC) 10 MG tablet; Take 1 tablet (10 mg total) by mouth daily.  Dispense: 90 tablet; Refill: 1  4. OSA (obstructive sleep apnea)   5. History of aortic valve replacement   6. Mild intermittent asthma without complication  - budesonide (PULMICORT) 0.5 MG/2ML nebulizer solution; Take 2 mLs (0.5 mg total) by nebulization as needed.  Dispense: 120 mL; Refill: 1 - montelukast (SINGULAIR) 10 MG tablet; Take 1 tablet (10 mg total) by mouth at bedtime.  Dispense: 90 tablet; Refill: 0  7. Carotid atherosclerosis, bilateral  Up to date with vascular surgeon   8. History of hypothyroidism  Recheck TSH  9. Polymyalgia rheumatica ([place])  She is stable, stopped seeing Rheumatologist   10. Diabetes mellitus type 2 in obese (HCC)  - metFORMIN (GLUCOPHAGE) 500 MG tablet; Take 1 tablet (500 mg total) by mouth 2 (two) times daily with a  meal.  Dispense: 180 tablet; Refill: 0  11. Breast cancer screening by mammogram  - MM 3D SCREEN BREAST BILATERAL; Future  I discussed the assessment and treatment plan with the patient. The patient was provided an opportunity to ask questions and all were answered. The patient agreed with the plan and demonstrated an understanding of the instructions.  The patient was advised to call back or seek an in-person evaluation if the symptoms worsen or if the condition fails to improve as anticipated.  I provided 25  minutes of non-face-to-face time during this encounter.

## 2019-12-28 DIAGNOSIS — H6121 Impacted cerumen, right ear: Secondary | ICD-10-CM | POA: Diagnosis not present

## 2020-01-04 ENCOUNTER — Telehealth: Payer: Self-pay | Admitting: Family Medicine

## 2020-01-04 ENCOUNTER — Ambulatory Visit: Payer: Self-pay | Admitting: Family Medicine

## 2020-01-04 DIAGNOSIS — J4520 Mild intermittent asthma, uncomplicated: Secondary | ICD-10-CM

## 2020-01-04 NOTE — Telephone Encounter (Signed)
Pt called with back pain 3/10 for 5 days but has now noticed today her abd is distended.   (Originally was saying side pain but said it is not flank, just to the side of spine.) Has headache so DT required a virtual visit, scheduled with Dr. Ancil Boozer for 1/28. Pt would not consider seeing anyone else in practice.Pt will call back for changes in sx.  Reason for Disposition . [1] MILD pain (i.e., scale 1-3; does not interfere with normal activities) AND [2] present > 3 days . [1] MODERATE back pain (e.g., interferes with normal activities) AND [2] present > 3 days  Answer Assessment - Initial Assessment Questions 1. LOCATION: "Where does it hurt?" (e.g., left, right)     Right back pain  2. ONSET: "When did the pain start?"     5 days 3. SEVERITY: "How bad is the pain?" (e.g., Scale 1-10; mild, moderate, or severe)   - MILD (1-3): doesn't interfere with normal activities    - MODERATE (4-7): interferes with normal activities or awakens from sleep    - SEVERE (8-10): excruciating pain and patient unable to do normal activities (stays in bed)       3 or 4 out of 10 4. PATTERN: "Does the pain come and go, or is it constant?"      constant 5. CAUSE: "What do you think is causing the pain?"     No idea 6. OTHER SYMPTOMS:  "Do you have any other symptoms?" (e.g., fever, abdominal pain, vomiting, leg weakness, burning with urination, blood in urine)     abd distention, not urine problems, no nausea 7. PREGNANCY:  "Is there any chance you are pregnant?" "When was your last menstrual period?"     na  Answer Assessment - Initial Assessment Questions 1. ONSET: "When did the pain begin?"      5 days ago 2. LOCATION: "Where does it hurt?" (upper, mid or lower back)     Upper back, to the side but not flank pain 3. SEVERITY: "How bad is the pain?"  (e.g., Scale 1-10; mild, moderate, or severe)   - MILD (1-3): doesn't interfere with normal activities    - MODERATE (4-7): interferes with normal  activities or awakens from sleep    - SEVERE (8-10): excruciating pain, unable to do any normal activities      3/10 4. PATTERN: "Is the pain constant?" (e.g., yes, no; constant, intermittent)      constant 5. RADIATION: "Does the pain shoot into your legs or elsewhere?"     no 6. CAUSE:  "What do you think is causing the back pain?"      unknown 7. BACK OVERUSE:  "Any recent lifting of heavy objects, strenuous work or exercise?"     no 8. MEDICATIONS: "What have you taken so far for the pain?" (e.g., nothing, acetaminophen, NSAIDS)     nothing 9. NEUROLOGIC SYMPTOMS: "Do you have any weakness, numbness, or problems with bowel/bladder control?"     No, only GI sx is questionable distention 10. OTHER SYMPTOMS: "Do you have any other symptoms?" (e.g., fever, abdominal pain, burning with urination, blood in urine)       No other pain, abdomen looks larger, not hard, maybe more taught than usual 11. PREGNANCY: "Is there any chance you are pregnant?" (e.g., yes, no; LMP)       na  Protocols used: FLANK PAIN-A-AH, BACK PAIN-A-AH

## 2020-01-04 NOTE — Telephone Encounter (Signed)
Pt mentioned on triage call this afternoon that her nebulizer is not working. She called [facility] and they said the office would have to call to get another one. The number she gave is [phone].

## 2020-01-04 NOTE — Addendum Note (Signed)
Addended by: Inda Coke on: 01/04/2020 04:12 PM   Modules accepted: Orders

## 2020-01-04 NOTE — Addendum Note (Signed)
Addended by: Steele Sizer F on: 01/04/2020 04:46 PM   Modules accepted: Orders

## 2020-01-06 ENCOUNTER — Other Ambulatory Visit: Payer: Self-pay | Admitting: Family Medicine

## 2020-01-06 ENCOUNTER — Telehealth (INDEPENDENT_AMBULATORY_CARE_PROVIDER_SITE_OTHER): Payer: Medicare Other | Admitting: Family Medicine

## 2020-01-06 ENCOUNTER — Encounter: Payer: Self-pay | Admitting: Family Medicine

## 2020-01-06 VITALS — HR 99

## 2020-01-06 DIAGNOSIS — J4520 Mild intermittent asthma, uncomplicated: Secondary | ICD-10-CM

## 2020-01-06 DIAGNOSIS — R109 Unspecified abdominal pain: Secondary | ICD-10-CM

## 2020-01-06 MED ORDER — TAMSULOSIN HCL 0.4 MG PO CAPS: 0.4000 mg | ORAL_CAPSULE | Freq: Every day | ORAL | 0 refills | Status: DC

## 2020-01-06 MED ORDER — NEBULIZER AIR TUBE/PLUGS MISC: 1.0000 | Freq: Every day | 2 refills | Status: AC

## 2020-01-06 NOTE — Progress Notes (Signed)
Name: Bailey [name]   MRN: [id number]    DOB: [birth year]   Date:01/06/2020       Progress Note  Subjective  Chief Complaint  Chief Complaint  Patient presents with  . Abdominal Pain    I connected with  [name] on 01/06/20 at  9:40 AM EST by telephone and verified that I am speaking with the correct person using two identifiers.  I discussed the limitations, risks, security and privacy concerns of performing an evaluation and management service by telephone and the availability of in person appointments. Staff also discussed with the patient that there may be a patient responsible charge related to this service. Patient Location: at home  Provider Location: [facility]   HPI  Abdominal Pain: she developed sudden onset of  dull , nagging , constant pain, on the right flank, symptoms started about one week ago.  She has a history of kidney stone and she did not have any urinary symptoms at the time. No rashes, fever or chills. She has gained 6 lbs initially but weight is down now. She was seen by Dr. Risa Grill for laser removal . She was admitted in 03/2014 because of it and had hydronephrosis on the right side. Pain seems to improve slightly when she applies pressure to the area. She denies fever, chills, nausea, vomiting, change in appetite or bowel changes.   Asthma: she has noticed some intermittent SOB and palpitation that improves with neb therapy, however her tubes are not working ( compressor still makes noise) the company that provides her machine stated she is not eligible for a replacement until April, we will send tubes to pharmacy.   Palpitation: heart rate has been going up and down, she has a cardiologist, not currently having problems, may need a sooner visit.    Patient Active Problem List   Diagnosis Date Noted  . Recurrent pancreatitis 05/26/2019  . Left carotid artery stenosis 08/05/2018  . OSA (obstructive sleep apnea) 01/20/2017  . Mild  cognitive impairment 01/20/2017  . Carotid atherosclerosis, bilateral 01/20/2017  . Abnormal brain MRI 08/26/2016  . Inflammatory polyarthritis ([place]) 11/12/2015  . Erosive osteoarthritis of hand 11/12/2015  . History of vitamin D deficiency 11/06/2015  . Hearing loss 08/24/2015  . Asthma, mild intermittent 08/23/2015  . Arthritis, degenerative 08/23/2015  . Dyslipidemia 08/23/2015  . H/O: hypothyroidism 08/23/2015  . Obesity (BMI 30-39.9) 08/23/2015  . HZV (herpes zoster virus) post herpetic neuralgia 08/23/2015  . History of renal stone 04/01/2014  . Diabetes mellitus, controlled ([place]) 04/01/2014  . Essential hypertension, benign 04/01/2014  . Aortic stenosis 06/16/2012  . H/O prosthetic heart valve 06/15/2012  . CD (contact dermatitis) 07/25/2008    Past Surgical History:  Procedure Laterality Date  . AORTIC VALVE REPLACEMENT  06/2012   [facility]  . APPENDECTOMY  1958  . CHOLECYSTECTOMY  1978  . COLONOSCOPY  05-09-2014  . COLONOSCOPY    . CYSTOSCOPY/RETROGRADE/URETEROSCOPY Right 07/04/2014   Procedure: CYSTOSCOPY/RETROGRADE/URETEROSCOPY;  Surgeon: Bernestine Amass, MD;  Location: [facility];  Service: Urology;  Laterality: Right;  . ENDARTERECTOMY Left 08/05/2018   Procedure: ENDARTERECTOMY CAROTID LEFT;  Surgeon: Waynetta Sandy, MD;  Location: [place];  Service: Vascular;  Laterality: Left;  . ESOPHAGOGASTRODUODENOSCOPY    . EXTRACORPOREAL SHOCK WAVE LITHOTRIPSY Right 06/ 2015     [place]  . HOLMIUM LASER APPLICATION Right 0000000   Procedure: HOLMIUM LASER APPLICATION;  Surgeon: Bernestine Amass, MD;  Location: [facility];  Service: Urology;  Laterality: Right;  . PATCH ANGIOPLASTY Left 08/05/2018   Procedure: PATCH ANGIOPLASTY USING XENOSURE BIOLOGIC PATCH 1cm x 6cm;  Surgeon: Waynetta Sandy, MD;  Location: [place];  Service: Vascular;  Laterality: Left;  . POLYPECTOMY    . TONSILLECTOMY  1953  . UPPER GASTROINTESTINAL ENDOSCOPY        Family History  Problem Relation Age of Onset  . Heart disease Mother   . Heart disease Father   . Asthma Daughter   . Asthma Maternal Grandfather   . Heart disease Sister        smoker  . Heart attack Sister 63  . Kidney cancer Sister   . Heart disease Sister        smoker  . Heart disease Sister        smoker  . Stomach cancer Maternal Grandmother   . Colon cancer Neg Hx   . Esophageal cancer Neg Hx   . Rectal cancer Neg Hx   . Colon polyps Neg Hx      Current Outpatient Medications:  .  albuterol (PROAIR HFA) 108 (90 Base) MCG/ACT inhaler, Inhale 2 puffs into the lungs every 4 (four) hours as needed for shortness of breath., Disp: 1 Inhaler, Rfl: 2 .  amoxicillin (AMOXIL) 500 MG capsule, Take 4 capsules by mouth 30 - 60 minutes prior to dental procedures, Disp: 4 capsule, Rfl: 3 .  Ascorbic Acid (VITAMIN C) 100 MG tablet, Take 100 mg by mouth daily., Disp: , Rfl:  .  aspirin EC 81 MG tablet, Take 81 mg by mouth daily., Disp: , Rfl:  .  B Complex CAPS, Take 1 tablet by mouth 2 (two) times daily., Disp: , Rfl:  .  budesonide (PULMICORT) 0.5 MG/2ML nebulizer solution, Take 2 mLs (0.5 mg total) by nebulization as needed., Disp: 120 mL, Rfl: 1 .  Cholecalciferol (VITAMIN D) 2000 UNITS tablet, Take 2,000 Units by mouth every other day. , Disp: , Rfl:  .  Coenzyme Q10 (COQ10) 150 MG CAPS, Take 150 mg by mouth 2 (two) times daily. , Disp: , Rfl:  .  diclofenac sodium (VOLTAREN) 1 % GEL, Apply 2 g topically 4 (four) times daily., Disp: 1 Tube, Rfl: 0 .  enalapril (VASOTEC) 10 MG tablet, Take 1 tablet (10 mg total) by mouth daily., Disp: 90 tablet, Rfl: 1 .  Krill Oil 1000 MG CAPS, Take 1 capsule by mouth daily., Disp: , Rfl:  .  MAG THREONATE-NIACINAMIDE ER PO, Take 800 mg by mouth 2 (two) times daily., Disp: , Rfl:  .  metFORMIN (GLUCOPHAGE) 500 MG tablet, Take 1 tablet (500 mg total) by mouth 2 (two) times daily with a meal., Disp: 180 tablet, Rfl: 0 .  montelukast (SINGULAIR)  10 MG tablet, Take 1 tablet (10 mg total) by mouth at bedtime., Disp: 90 tablet, Rfl: 0 .  Probiotic Product (PROBIOTIC DAILY PO), Take 1 capsule by mouth daily., Disp: , Rfl:   Allergies  Allergen Reactions  . Dilaudid [Hydromorphone Hcl] Other (See Comments)    Severe hypotension - pt states that if she is given this medication it will kill her  . Naproxen Shortness Of Breath  . Nsaids Shortness Of Breath and Other (See Comments)    Causes asthma  . Percocet [Oxycodone-Acetaminophen] Shortness Of Breath    Can tolerate oxy IR  . Latex Hives  . Other Swelling    Eye drop preservative Specific agent unspecified  . Rosuvastatin Other (See Comments)  myalgia  . Morphine And Related     Pain with IM injection    I personally reviewed active problem list, medication list, allergies, family history, social history with the patient/caregiver today.   ROS  Ten systems reviewed and is negative except as mentioned in HPI  She had episodes of tachycardia associated some chest soreness but resolved now  Objective  Virtual encounter, vitals not obtained.  There is no height or weight on file to calculate BMI.  Physical Exam  Awake, alert and oriented  PHQ2/9: Depression screen [facility] 2/9 01/06/2020 11/17/2019 04/09/2019 10/09/2018 08/28/2018  Decreased Interest 0 0 0 0 0  Down, Depressed, Hopeless 0 0 - 0 0  PHQ - 2 Score 0 0 0 0 0  Altered sleeping 0 0 0 0 0  Tired, decreased energy 0 0 0 0 0  Change in appetite 0 0 0 0 0  Feeling bad or failure about yourself  0 0 0 0 0  Trouble concentrating 0 0 0 0 0  Moving slowly or fidgety/restless 0 0 0 0 0  Suicidal thoughts 0 0 0 0 0  PHQ-9 Score 0 0 0 0 0  Difficult doing work/chores - - - Not difficult at all Not difficult at all   PHQ-2/9 Result is negative.    Fall Risk: Fall Risk  01/06/2020 11/17/2019 11/03/2019 10/09/2018 08/28/2018  Falls in the past year? 0 0 0 0 No  Comment - - Emmi Telephone Survey: data to providers prior to  load - -  Number falls in past yr: 0 0 - - -  Injury with Fall? 0 0 - - -  Risk for fall due to : - - - - Impaired vision;History of fall(s)  Risk for fall due to: Comment - - - - wears eyeglasses     Assessment & Plan  1. Right flank pain  Discussed getting CT scan now, she states pain is not very severe, she would like to monitor at this time, we will try flomax and she will send me mychart message Sunday/Monday if no improvement so we can schedule CT if not better by that time. Also explained importance of going to [facility] if worsening of symptoms between now and Monday  - tamsulosin (FLOMAX) 0.4 MG CAPS capsule; Take 1 capsule (0.4 mg total) by mouth daily.  Dispense: 30 capsule; Refill: 0  2. Mild intermittent asthma without complication  - Respiratory Therapy Supplies (NEBULIZER AIR TUBE/PLUGS) MISC; 1 each by Does not apply route daily.  Dispense: 1 each; Refill: 2  I discussed the assessment and treatment plan with the patient. The patient was provided an opportunity to ask questions and all were answered. The patient agreed with the plan and demonstrated an understanding of the instructions.   The patient was advised to call back or seek an in-person evaluation if the symptoms worsen or if the condition fails to improve as anticipated.  I provided 25 minutes of non-face-to-face time during this encounter.  Loistine Chance, MD

## 2020-01-06 NOTE — Telephone Encounter (Signed)
   Notes to clinic:  Patient requesting 90 day instead of 30 day   Requested Prescriptions  Pending Prescriptions Disp Refills   tamsulosin (FLOMAX) 0.4 MG CAPS capsule [Pharmacy Med Name: TAMSULOSIN 0.4MG  CAPSULES] 90 capsule     Sig: TAKE 1 CAPSULE(0.4 MG) BY MOUTH DAILY      Urology: Alpha-Adrenergic Blocker Passed - 01/06/2020 12:14 PM      Passed - Last BP in normal range    BP Readings from Last 1 Encounters:  10/22/19 109/83          Passed - Valid encounter within last 12 months    Recent Outpatient Visits           Today Right flank pain   [facility] Steele Sizer, MD   1 month ago Dyslipidemia associated with type 2 diabetes mellitus [facility])   [facility] Steele Sizer, MD   9 months ago Hypertension, benign   [facility] [place], Drue Stager, MD   1 year ago Diabetes mellitus type 2 in obese [facility])   [facility] Steele Sizer, MD   1 year ago Great toe pain, left   [place]       Future Appointments             In 4 months Steele Sizer, MD [facility], [facility]

## 2020-01-10 ENCOUNTER — Ambulatory Visit: Payer: Medicare Other

## 2020-01-12 ENCOUNTER — Other Ambulatory Visit: Payer: Self-pay | Admitting: Family Medicine

## 2020-01-12 ENCOUNTER — Encounter: Payer: Self-pay | Admitting: Family Medicine

## 2020-01-12 DIAGNOSIS — R1010 Upper abdominal pain, unspecified: Secondary | ICD-10-CM

## 2020-01-12 DIAGNOSIS — I10 Essential (primary) hypertension: Secondary | ICD-10-CM

## 2020-01-12 DIAGNOSIS — E1169 Type 2 diabetes mellitus with other specified complication: Secondary | ICD-10-CM

## 2020-01-12 DIAGNOSIS — E785 Hyperlipidemia, unspecified: Secondary | ICD-10-CM

## 2020-01-20 ENCOUNTER — Telehealth: Payer: Self-pay

## 2020-01-20 ENCOUNTER — Telehealth: Payer: Self-pay | Admitting: Cardiovascular Disease

## 2020-01-20 DIAGNOSIS — R233 Spontaneous ecchymoses: Secondary | ICD-10-CM

## 2020-01-20 DIAGNOSIS — R238 Other skin changes: Secondary | ICD-10-CM

## 2020-01-20 NOTE — Telephone Encounter (Signed)
Copied from [place] [id number]. Topic: General - Inquiry >> Jan 20, 2020 11:20 AM Richardo Priest, NT wrote: Reason for CRM: Pt called in stating she would like to add the covid-19 antibody test to be added to recent blood work. Pt would also like to see if anything can also be added due to her having random bruising on her foot and hand. Pt would just like to make sure she is okay. Please advise.

## 2020-01-20 NOTE — Telephone Encounter (Signed)
Dr. Katy Apo at [facility] is doing new cataract surgery (2nd one) scheduled for 02/01/20.  Asking about antibiotics.  Adv that the surgeon usually asks for clearance from cardiology if needed.

## 2020-01-20 NOTE — Progress Notes (Signed)
Chief Complaint  Patient presents with  . Follow-up    aortic stenosis   History of Present Illness: 74 yo female with history of HTN, HLD, DM, sleep apnea, aortic stenosis s/p AVR and carotid artery stenosis who is here today for cardiac follow up. She underwent AVR at [facility] in July  2013 with placement of a 21 mm bioprosthetic valve.  She had been followed at [facility] by Dr. Sharlet Salina until 2018 when she established in our office. Cardiac cath in 2013 with no evidence of CAD. Echo August 2019 with LVEF=55-60%, mild to moderate AS with mean gradient 22 mmHg. Her carotid artery disease is followed in the [facility] office. She underwent left carotid endarterectomy in August 2019 per Dr. Donzetta Matters. Echo August 2020 with LVEF=60-65%,mild mitral stenosis and moderate stenosis of the bioprosthetic AVR with mean gradient 34 mmHg. She was scheduled to be seen in September 2020 but missed that appointment.   She is here today for follow up. The patient denies any chest pain, dyspnea, lower extremity edema, orthopnea, PND, dizziness, near syncope or syncope. She is having weekly palpitations. Cataract surgery pending.   Primary Care Physician: Steele Sizer, MD  Past Medical History:  Diagnosis Date  . Allergy   . Arthritis   . Asthma   . B12 deficiency anemia   . Blood transfusion without reported diagnosis   . H/O aortic valve replacement 2013   at [facility]   . History of colon polyps   . History of kidney stones   . HTN (hypertension)   . Hyperlipidemia   . Left nephrolithiasis   . OSA (obstructive sleep apnea)    STUDY 12 YRS CPAP RX-- BUT NONTOLERANT  . Right ureteral stone   . S/P aortic valve replacement    2013  AT [facility]--- CARDIOLOGIST--  DR CRAWFORD AT [facility]  . Seasonal allergies   . Sleep apnea    wears cpap   . Statin intolerance   . Type 2 diabetes mellitus ([place])    type 2    Past Surgical History:  Procedure Laterality Date  . AORTIC VALVE REPLACEMENT  06/2012   [facility]  . APPENDECTOMY  1958   . CHOLECYSTECTOMY  1978  . COLONOSCOPY  05-09-2014  . COLONOSCOPY    . CYSTOSCOPY/RETROGRADE/URETEROSCOPY Right 07/04/2014   Procedure: CYSTOSCOPY/RETROGRADE/URETEROSCOPY;  Surgeon: Bernestine Amass, MD;  [facility];  Service: Urology;  Laterality: Right;  . ENDARTERECTOMY Left 08/05/2018   Procedure: ENDARTERECTOMY CAROTID LEFT;  Surgeon: Waynetta Sandy, MD;  Location: [place];  Service: Vascular;  Laterality: Left;  . ESOPHAGOGASTRODUODENOSCOPY    . EXTRACORPOREAL SHOCK WAVE LITHOTRIPSY Right 06/ 2015     [place]  . HOLMIUM LASER APPLICATION Right 0000000   Procedure: HOLMIUM LASER APPLICATION;  Surgeon: Bernestine Amass, MD;  Location[facility];  Service: Urology;  Laterality: Right;  . PATCH ANGIOPLASTY Left 08/05/2018   Procedure: PATCH ANGIOPLASTY USING XENOSURE BIOLOGIC PATCH 1cm x 6cm;  Surgeon: Waynetta Sandy, MD;  Location: [place];  Service: Vascular;  Laterality: Left;  . POLYPECTOMY    . TONSILLECTOMY  1953  . UPPER GASTROINTESTINAL ENDOSCOPY      Current Outpatient Medications  Medication Sig Dispense Refill  . albuterol (PROAIR HFA) 108 (90 Base) MCG/ACT inhaler Inhale 2 puffs into the lungs every 4 (four) hours as needed for shortness of breath. 1 Inhaler 2  . amoxicillin (AMOXIL) 500 MG capsule Take 4 capsules by mouth 30 - 60 minutes prior  to dental procedures 4 capsule 3  . Ascorbic Acid (VITAMIN C) 100 MG tablet Take 100 mg by mouth daily.    Marland Kitchen aspirin EC 81 MG tablet Take 81 mg by mouth daily.    . B Complex CAPS Take 1 tablet by mouth 2 (two) times daily.    . budesonide (PULMICORT) 0.5 MG/2ML nebulizer solution Take 2 mLs (0.5 mg total) by nebulization as needed. 120 mL 1  . Cholecalciferol (VITAMIN D) 2000 UNITS tablet Take 2,000 Units by mouth every other day.     . Coenzyme Q10 (COQ10) 150 MG CAPS Take 150 mg by mouth 2 (two) times daily.     . diclofenac sodium (VOLTAREN) 1 % GEL Apply 2 g topically 4  (four) times daily. 1 Tube 0  . enalapril (VASOTEC) 10 MG tablet Take 1 tablet (10 mg total) by mouth daily. 90 tablet 1  . Krill Oil 1000 MG CAPS Take 1 capsule by mouth daily.    Marland Kitchen MAG THREONATE-NIACINAMIDE ER PO Take 800 mg by mouth 2 (two) times daily.    . metFORMIN (GLUCOPHAGE) 500 MG tablet Take 1 tablet (500 mg total) by mouth 2 (two) times daily with a meal. 180 tablet 0  . Probiotic Product (PROBIOTIC DAILY PO) Take 1 capsule by mouth daily.    Marland Kitchen Respiratory Therapy Supplies (NEBULIZER AIR TUBE/PLUGS) MISC 1 each by Does not apply route daily. 1 each 2  . tamsulosin (FLOMAX) 0.4 MG CAPS capsule TAKE 1 CAPSULE(0.4 MG) BY MOUTH DAILY 90 capsule 0   No current facility-administered medications for this visit.    Allergies  Allergen Reactions  . Dilaudid [Hydromorphone Hcl] Other (See Comments)    Severe hypotension - pt states that if she is given this medication it will kill her  . Naproxen Shortness Of Breath  . Nsaids Shortness Of Breath and Other (See Comments)    Causes asthma  . Percocet [Oxycodone-Acetaminophen] Shortness Of Breath    Can tolerate oxy IR  . Latex Hives  . Other Swelling    Eye drop preservative Specific agent unspecified  . Rosuvastatin Other (See Comments)    myalgia  . Morphine And Related     Pain with IM injection    Social History   Socioeconomic History  . Marital status: Single    Spouse name: Not on file  . Number of children: 2  . Years of education: some college  . Highest education level: 12th grade  Occupational History  . Occupation: Works for [employer]-retired    Employer: [employer]  Tobacco Use  . Smoking status: Never Smoker  . Smokeless tobacco: Never Used  . Tobacco comment: smoking cessation materials not required  Substance and Sexual Activity  . Alcohol use: No    Alcohol/week: 0.0 standard drinks  . Drug use: No  . Sexual activity: Not Currently  Other Topics Concern  . Not on file  Social  History Narrative   Divorced, 2 children. No longer working    Scientist, physiological Strain:   . Difficulty of Paying Living Expenses: Not on file  Food Insecurity:   . Worried About Charity fundraiser in the Last Year: Not on file  . Ran Out of Food in the Last Year: Not on file  Transportation Needs:   . Lack of Transportation (Medical): Not on file  . Lack of Transportation (Non-Medical): Not on file  Physical Activity:   . Days of Exercise  per Week: Not on file  . Minutes of Exercise per Session: Not on file  Stress:   . Feeling of Stress : Not on file  Social Connections:   . Frequency of Communication with Friends and Family: Not on file  . Frequency of Social Gatherings with Friends and Family: Not on file  . Attends Religious Services: Not on file  . Active Member of Clubs or Organizations: Not on file  . Attends Archivist Meetings: Not on file  . Marital Status: Not on file  Intimate Partner Violence:   . Fear of Current or Ex-Partner: Not on file  . Emotionally Abused: Not on file  . Physically Abused: Not on file  . Sexually Abused: Not on file    Family History  Problem Relation Age of Onset  . Heart disease Mother   . Heart disease Father   . Asthma Daughter   . Asthma Maternal Grandfather   . Heart disease Sister        smoker  . Heart attack Sister 20  . Kidney cancer Sister   . Heart disease Sister        smoker  . Heart disease Sister        smoker  . Stomach cancer Maternal Grandmother   . Colon cancer Neg Hx   . Esophageal cancer Neg Hx   . Rectal cancer Neg Hx   . Colon polyps Neg Hx     Review of Systems:  As stated in the HPI and otherwise negative.   BP 126/82   Pulse 87   Ht 5\' 2"  (1.575 m)   Wt 155 lb (70.3 kg)   SpO2 99%   BMI 28.35 kg/m   Physical Examination: General: Well developed, well nourished, NAD  HEENT: OP clear, mucus membranes moist  SKIN: warm, dry. No rashes. Neuro: No  focal deficits  Musculoskeletal: Muscle strength 5/5 all ext  Psychiatric: Mood and affect normal  Neck: No JVD, no carotid bruits, no thyromegaly, no lymphadenopathy.  Lungs:Clear bilaterally, no wheezes, rhonci, crackles Cardiovascular: Regular rate and rhythm. Systolic murmur, soft.  Abdomen:Soft. Bowel sounds present. Non-tender.  Extremities: No lower extremity edema. Pulses are 2 + in the bilateral DP/PT.  Echo August 2020: 1. The left ventricle has normal systolic function with an ejection  fraction of 60-65%. The cavity size was normal. There is mildly increased  left ventricular wall thickness. Left ventricular diastolic Doppler  parameters are consistent with  pseudonormalization. Elevated left atrial and left ventricular  end-diastolic pressures.  2. The right ventricle has normal systolic function. The cavity was  normal. There is no increase in right ventricular wall thickness. Right  ventricular systolic pressure could not be assessed.  3. Left atrial size was mildly dilated.  4. The mitral valve is degenerative. Mild thickening of the mitral valve  leaflet. There is severe mitral annular calcification present. Mild mitral  valve stenosis.  5. The aortic valve has an indeterminate number of cusps but likely  tricuspid. Severe calcifcation of the aortic valve. There is Moderate to  severe stenosis of the aortic valve, with a calculated valve area of 0.58  cm. AV Area (VTI): 0.58 cm. AV Mean  Grad: 34.0 mmHg. LVOT/AV VTI ratio: 0.22. Findings could be consistent  with paradoxical low flow low gradient severe AS as the SV index low at  35.84.   EKG:  EKG is ordered today. The ekg ordered today demonstrates NSR, rate 71 bpm  Recent Labs: No results  found for requested labs within last 8760 hours.   Lipid Panel    Component Value Date/Time   CHOL 217 (H) 03/09/2018 0849   CHOL 225 (H) 08/24/2015 1023   TRIG 130 03/09/2018 0849   HDL 58 03/09/2018 0849   HDL  45 08/24/2015 1023   CHOLHDL 3.7 03/09/2018 0849   VLDL 24 08/21/2016 1004   LDLCALC 134 (H) 03/09/2018 0849   LDLDIRECT 162.7 06/18/2007 1534     Wt Readings from Last 3 Encounters:  01/21/20 155 lb (70.3 kg)  10/22/19 154 lb 8 oz (70.1 kg)  04/09/19 162 lb (73.5 kg)     Other studies Reviewed: Additional studies/ records that were reviewed today include: . Review of the above records demonstrates:   Assessment and Plan:   1. Aortic valve stenosis s/p AVR: She is s/p aortic valve replacement with placement of a 21 mm bioprosthetic AVR at [facility] in 2013. Moderate AS by echo August 2020. Will continue ASA. She should continue to use antibiotic prophylaxis prior to indicated procedures. She will not need to use antibiotics prior to her cataract surgery. Will repeat an echo now given the degree of change in her aortic valve gradients over the past year.    2. HTN: BP is controlled.   3. Carotid artery stenosis: s/p left carotid endarterectomy August 2019. [facility]ed in [facility].   4. Hyperlipidemia: She is intolerant of statins. Consider Praluent or Repatha given carotid artery disease.   5. Palpitations: Will arrange a 14 day Zio patch cardiac monitor.   Current medicines are reviewed at length with the patient today.  The patient does not have concerns regarding medicines.  The following changes have been made:  no change  Labs/ tests ordered today include:   Orders Placed This Encounter  Procedures  . LONG TERM MONITOR (3-14 DAYS)  . ECHOCARDIOGRAM COMPLETE    Disposition:   FU with me in 12 months  Signed, Lauree Chandler, MD 01/21/2020 9:44 AM    [facility] [address], [place], [place]  [address] Phone: [phone]; Fax: [phone]

## 2020-01-20 NOTE — Telephone Encounter (Signed)
Patient requesting to speak with Dr. Angelena Form - she would like to speak with him personally but she understands if he is too busy. One, she would like an echo order put in and two she has a question about an upcoming eye surgery she is to have.

## 2020-01-21 ENCOUNTER — Telehealth: Payer: Self-pay | Admitting: Radiology

## 2020-01-21 ENCOUNTER — Encounter: Payer: Self-pay | Admitting: Cardiovascular Disease

## 2020-01-21 ENCOUNTER — Other Ambulatory Visit: Payer: Self-pay

## 2020-01-21 ENCOUNTER — Ambulatory Visit (INDEPENDENT_AMBULATORY_CARE_PROVIDER_SITE_OTHER): Payer: Medicare Other | Admitting: Cardiovascular Disease

## 2020-01-21 VITALS — BP 126/82 | HR 87 | Ht 62.0 in | Wt 155.0 lb

## 2020-01-21 DIAGNOSIS — R002 Palpitations: Secondary | ICD-10-CM

## 2020-01-21 DIAGNOSIS — I350 Nonrheumatic aortic (valve) stenosis: Secondary | ICD-10-CM

## 2020-01-21 DIAGNOSIS — I6522 Occlusion and stenosis of left carotid artery: Secondary | ICD-10-CM | POA: Diagnosis not present

## 2020-01-21 DIAGNOSIS — R1010 Upper abdominal pain, unspecified: Secondary | ICD-10-CM | POA: Diagnosis not present

## 2020-01-21 DIAGNOSIS — I6523 Occlusion and stenosis of bilateral carotid arteries: Secondary | ICD-10-CM

## 2020-01-21 DIAGNOSIS — E1169 Type 2 diabetes mellitus with other specified complication: Secondary | ICD-10-CM | POA: Diagnosis not present

## 2020-01-21 DIAGNOSIS — E785 Hyperlipidemia, unspecified: Secondary | ICD-10-CM | POA: Diagnosis not present

## 2020-01-21 DIAGNOSIS — I10 Essential (primary) hypertension: Secondary | ICD-10-CM

## 2020-01-21 DIAGNOSIS — R238 Other skin changes: Secondary | ICD-10-CM | POA: Diagnosis not present

## 2020-01-21 NOTE — Telephone Encounter (Signed)
Enrolled patient for a 14 day Zio monitor to be mailed to patients home.  

## 2020-01-21 NOTE — Telephone Encounter (Signed)
Called [facility] can not add on, left voicemail with patient and put labs upfront in file

## 2020-01-21 NOTE — Patient Instructions (Signed)
Medication Instructions:  No changes *If you need a refill on your cardiac medications before your next appointment, please call your pharmacy*  Lab Work: none If you have labs (blood work) drawn today and your tests are completely normal, you will receive your results only by: Marland Kitchen MyChart Message (if you have MyChart) OR . A paper copy in the mail If you have any lab test that is abnormal or we need to change your treatment, we will call you to review the results.  Testing/Procedures: Your physician has requested that you have an echocardiogram. Echocardiography is a painless test that uses sound waves to create images of your heart. It provides your doctor with information about the size and shape of your heart and how well your heart's chambers and valves are working. This procedure takes approximately one hour. There are no restrictions for this procedure.   Follow-Up: At [facility], you and your health needs are our priority.  As part of our continuing mission to provide you with exceptional heart care, we have created designated Provider Care Teams.  These Care Teams include your primary Cardiologist (physician) and Advanced Practice Providers (APPs -  Physician Assistants and Nurse Practitioners) who all work together to provide you with the care you need, when you need it.  Your physician has recommended that you wear a Zio heart monitor. Heart monitors are medical devices that record the heart's electrical activity. Doctors most often use these monitors to diagnose arrhythmias. Arrhythmias are problems with the speed or rhythm of the heartbeat. The monitor is a small, portable device. You can wear one while you do your normal daily activities. This is usually used to diagnose what is causing palpitations/syncope (passing out).   Your next appointment:   6 month(s)  The format for your next appointment:   In Person  Provider:   You may see Lauree Chandler, MD or one of the  following Advanced Practice Providers on your designated Care Team:    Melina Copa, PA-C  Ermalinda Barrios, PA-C   Other Instructions

## 2020-01-22 LAB — CBC WITH DIFFERENTIAL/PLATELET
Basophils Absolute: 0 10*3/uL (ref 0.0–0.2)
Basos: 1 %
EOS (ABSOLUTE): 0.1 10*3/uL (ref 0.0–0.4)
Eos: 2 %
Hematocrit: 40.4 % (ref 34.0–46.6)
Hemoglobin: 13.2 g/dL (ref 11.1–15.9)
Immature Grans (Abs): 0 10*3/uL (ref 0.0–0.1)
Immature Granulocytes: 0 %
Lymphocytes Absolute: 1.6 10*3/uL (ref 0.7–3.1)
Lymphs: 33 %
MCH: 29 pg (ref 26.6–33.0)
MCHC: 32.7 g/dL (ref 31.5–35.7)
MCV: 89 fL (ref 79–97)
Monocytes Absolute: 0.6 10*3/uL (ref 0.1–0.9)
Monocytes: 13 %
Neutrophils Absolute: 2.4 10*3/uL (ref 1.4–7.0)
Neutrophils: 51 %
Platelets: 202 10*3/uL (ref 150–450)
RBC: 4.55 x10E6/uL (ref 3.77–5.28)
RDW: 13.3 % (ref 11.7–15.4)
WBC: 4.7 10*3/uL (ref 3.4–10.8)

## 2020-01-22 LAB — LIPID PANEL
Chol/HDL Ratio: 2.9 ratio (ref 0.0–4.4)
Cholesterol, Total: 230 mg/dL — ABNORMAL HIGH (ref 100–199)
HDL: 78 mg/dL (ref >39)
LDL Chol Calc (NIH): 132 mg/dL — ABNORMAL HIGH (ref 0–99)
Triglycerides: 113 mg/dL (ref 0–149)
VLDL Cholesterol Cal: 20 mg/dL (ref 5–40)

## 2020-01-22 LAB — COMPREHENSIVE METABOLIC PANEL
ALT: 8 IU/L (ref 0–32)
AST: 19 IU/L (ref 0–40)
Albumin/Globulin Ratio: 1.9 (ref 1.2–2.2)
Albumin: 4.5 g/dL (ref 3.7–4.7)
Alkaline Phosphatase: 47 IU/L (ref 39–117)
BUN/Creatinine Ratio: 14 (ref 12–28)
BUN: 10 mg/dL (ref 8–27)
Bilirubin Total: 0.6 mg/dL (ref 0.0–1.2)
CO2: 26 mmol/L (ref 20–29)
Calcium: 10.1 mg/dL (ref 8.7–10.3)
Chloride: 101 mmol/L (ref 96–106)
Creatinine, Ser: 0.69 mg/dL (ref 0.57–1.00)
GFR calc Af Amer: 101 mL/min/{1.73_m2} (ref >59)
GFR calc non Af Amer: 87 mL/min/{1.73_m2} (ref >59)
Globulin, Total: 2.4 g/dL (ref 1.5–4.5)
Glucose: 97 mg/dL (ref 65–99)
Potassium: 4.2 mmol/L (ref 3.5–5.2)
Sodium: 142 mmol/L (ref 134–144)
Total Protein: 6.9 g/dL (ref 6.0–8.5)

## 2020-01-22 LAB — HEMOGLOBIN A1C
Est. average glucose Bld gHb Est-mCnc: 105 mg/dL
Hgb A1c MFr Bld: 5.3 % (ref 4.8–5.6)

## 2020-01-22 LAB — LIPASE: Lipase: 55 U/L (ref 14–85)

## 2020-01-22 LAB — MICROALBUMIN / CREATININE URINE RATIO
Creatinine, Urine: 120.8 mg/dL
Microalb/Creat Ratio: 12 mg/g creat (ref 0–29)
Microalbumin, Urine: 14.1 ug/mL

## 2020-01-22 LAB — H. PYLORI BREATH TEST: H pylori Breath Test: NEGATIVE

## 2020-01-23 LAB — PROTIME-INR
INR: 1 (ref 0.9–1.2)
Prothrombin Time: 10.9 s (ref 9.1–12.0)

## 2020-01-23 LAB — SAR COV2 SEROLOGY (COVID19)AB(IGG),IA: DiaSorin SARS-CoV-2 Ab, IgG: NEGATIVE

## 2020-01-24 ENCOUNTER — Other Ambulatory Visit (INDEPENDENT_AMBULATORY_CARE_PROVIDER_SITE_OTHER): Payer: Medicare Other | Admitting: *Deleted

## 2020-01-24 DIAGNOSIS — I6522 Occlusion and stenosis of left carotid artery: Secondary | ICD-10-CM

## 2020-01-24 DIAGNOSIS — R002 Palpitations: Secondary | ICD-10-CM

## 2020-01-24 DIAGNOSIS — I6523 Occlusion and stenosis of bilateral carotid arteries: Secondary | ICD-10-CM

## 2020-01-24 DIAGNOSIS — I350 Nonrheumatic aortic (valve) stenosis: Secondary | ICD-10-CM

## 2020-01-25 ENCOUNTER — Ambulatory Visit (INDEPENDENT_AMBULATORY_CARE_PROVIDER_SITE_OTHER): Payer: Medicare Other

## 2020-01-25 VITALS — Ht 62.0 in | Wt 151.0 lb

## 2020-01-25 DIAGNOSIS — Z780 Asymptomatic menopausal state: Secondary | ICD-10-CM | POA: Diagnosis not present

## 2020-01-25 DIAGNOSIS — Z0000 Encounter for general adult medical examination without abnormal findings: Secondary | ICD-10-CM | POA: Diagnosis not present

## 2020-01-25 NOTE — Patient Instructions (Signed)
[name] , Thank you for taking time to come for your Medicare Wellness Visit. I appreciate your ongoing commitment to your health goals. Please review the following plan we discussed and let me know if I can assist you in the future.   Screening recommendations/referrals: Colonoscopy: done 01/01/19. Repeat in 2025.  Mammogram: scheduled for 02/17/20   Bone Density: Please call [phone] to schedule your bone density screening.  Recommended yearly ophthalmology/optometry visit for glaucoma screening and checkup Recommended yearly dental visit for hygiene and checkup  Vaccinations: Influenza vaccine: postponed Pneumococcal vaccine: done 08/24/15 Tdap vaccine: due Shingles vaccine: Shingrix discussed. Please contact your pharmacy for coverage information.   Advanced directives: Advance directive discussed with you today. Even though you declined this today please call our office should you change your mind and we can give you the proper paperwork for you to fill out.  Conditions/risks identified: Keep up the great work!  Next appointment: Please follow up in one year for your Medicare Annual Wellness visit.     Preventive Care 59 Years and Older, Female Preventive care refers to lifestyle choices and visits with your health care provider that can promote health and wellness. What does preventive care include?  A yearly physical exam. This is also called an annual well check.  Dental exams once or twice a year.  Routine eye exams. Ask your health care provider how often you should have your eyes checked.  Personal lifestyle choices, including:  Daily care of your teeth and gums.  Regular physical activity.  Eating a healthy diet.  Avoiding tobacco and drug use.  Limiting alcohol use.  Practicing safe sex.  Taking low-dose aspirin every day.  Taking vitamin and mineral supplements as recommended by your health care provider. What happens during an annual well  check? The services and screenings done by your health care provider during your annual well check will depend on your age, overall health, lifestyle risk factors, and family history of disease. Counseling  Your health care provider may ask you questions about your:  Alcohol use.  Tobacco use.  Drug use.  Emotional well-being.  Home and relationship well-being.  Sexual activity.  Eating habits.  History of falls.  Memory and ability to understand (cognition).  Work and work Statistician.  Reproductive health. Screening  You may have the following tests or measurements:  Height, weight, and BMI.  Blood pressure.  Lipid and cholesterol levels. These may be checked every 5 years, or more frequently if you are over 86 years old.  Skin check.  Lung cancer screening. You may have this screening every year starting at age 24 if you have a 30-pack-year history of smoking and currently smoke or have quit within the past 15 years.  Fecal occult blood test (FOBT) of the stool. You may have this test every year starting at age 7.  Flexible sigmoidoscopy or colonoscopy. You may have a sigmoidoscopy every 5 years or a colonoscopy every 10 years starting at age 32.  Hepatitis C blood test.  Hepatitis B blood test.  Sexually transmitted disease (STD) testing.  Diabetes screening. This is done by checking your blood sugar (glucose) after you have not eaten for a while (fasting). You may have this done every 1-3 years.  Bone density scan. This is done to screen for osteoporosis. You may have this done starting at age 25.  Mammogram. This may be done every 1-2 years. Talk to your health care provider about how often you should have regular  mammograms. Talk with your health care provider about your test results, treatment options, and if necessary, the need for more tests. Vaccines  Your health care provider may recommend certain vaccines, such as:  Influenza vaccine. This is  recommended every year.  Tetanus, diphtheria, and acellular pertussis (Tdap, Td) vaccine. You may need a Td booster every 10 years.  Zoster vaccine. You may need this after age 62.  Pneumococcal 13-valent conjugate (PCV13) vaccine. One dose is recommended after age 65.  Pneumococcal polysaccharide (PPSV23) vaccine. One dose is recommended after age 82. Talk to your health care provider about which screenings and vaccines you need and how often you need them. This information is not intended to replace advice given to you by your health care provider. Make sure you discuss any questions you have with your health care provider. Document Released: 12/22/2015 Document Revised: 08/14/2016 Document Reviewed: 09/26/2015 Elsevier Interactive Patient Education  2017 [employer] Prevention in the Home Falls can cause injuries. They can happen to people of all ages. There are many things you can do to make your home safe and to help prevent falls. What can I do on the outside of my home?  Regularly fix the edges of walkways and driveways and fix any cracks.  Remove anything that might make you trip as you walk through a door, such as a raised step or threshold.  Trim any bushes or trees on the path to your home.  Use bright outdoor lighting.  Clear any walking paths of anything that might make someone trip, such as rocks or tools.  Regularly check to see if handrails are loose or broken. Make sure that both sides of any steps have handrails.  Any raised decks and porches should have guardrails on the edges.  Have any leaves, snow, or ice cleared regularly.  Use sand or salt on walking paths during winter.  Clean up any spills in your garage right away. This includes oil or grease spills. What can I do in the bathroom?  Use night lights.  Install grab bars by the toilet and in the tub and shower. Do not use towel bars as grab bars.  Use non-skid mats or decals in the tub or  shower.  If you need to sit down in the shower, use a plastic, non-slip stool.  Keep the floor dry. Clean up any water that spills on the floor as soon as it happens.  Remove soap buildup in the tub or shower regularly.  Attach bath mats securely with double-sided non-slip rug tape.  Do not have throw rugs and other things on the floor that can make you trip. What can I do in the bedroom?  Use night lights.  Make sure that you have a light by your bed that is easy to reach.  Do not use any sheets or blankets that are too big for your bed. They should not hang down onto the floor.  Have a firm chair that has side arms. You can use this for support while you get dressed.  Do not have throw rugs and other things on the floor that can make you trip. What can I do in the kitchen?  Clean up any spills right away.  Avoid walking on wet floors.  Keep items that you use a lot in easy-to-reach places.  If you need to reach something above you, use a strong step stool that has a grab bar.  Keep electrical cords out of the way.  Do not use floor polish or wax that makes floors slippery. If you must use wax, use non-skid floor wax.  Do not have throw rugs and other things on the floor that can make you trip. What can I do with my stairs?  Do not leave any items on the stairs.  Make sure that there are handrails on both sides of the stairs and use them. Fix handrails that are broken or loose. Make sure that handrails are as long as the stairways.  Check any carpeting to make sure that it is firmly attached to the stairs. Fix any carpet that is loose or worn.  Avoid having throw rugs at the top or bottom of the stairs. If you do have throw rugs, attach them to the floor with carpet tape.  Make sure that you have a light switch at the top of the stairs and the bottom of the stairs. If you do not have them, ask someone to add them for you. What else can I do to help prevent  falls?  Wear shoes that:  Do not have high heels.  Have rubber bottoms.  Are comfortable and fit you well.  Are closed at the toe. Do not wear sandals.  If you use a stepladder:  Make sure that it is fully opened. Do not climb a closed stepladder.  Make sure that both sides of the stepladder are locked into place.  Ask someone to hold it for you, if possible.  Clearly mark and make sure that you can see:  Any grab bars or handrails.  First and last steps.  Where the edge of each step is.  Use tools that help you move around (mobility aids) if they are needed. These include:  Canes.  Walkers.  Scooters.  Crutches.  Turn on the lights when you go into a dark area. Replace any light bulbs as soon as they burn out.  Set up your furniture so you have a clear path. Avoid moving your furniture around.  If any of your floors are uneven, fix them.  If there are any pets around you, be aware of where they are.  Review your medicines with your doctor. Some medicines can make you feel dizzy. This can increase your chance of falling. Ask your doctor what other things that you can do to help prevent falls. This information is not intended to replace advice given to you by your health care provider. Make sure you discuss any questions you have with your health care provider. Document Released: 09/21/2009 Document Revised: 05/02/2016 Document Reviewed: 12/30/2014 Elsevier Interactive Patient Education  2017 [employer].

## 2020-01-25 NOTE — Progress Notes (Addendum)
Subjective:   Bailey [name] is a 74 y.o. female who presents for Medicare Annual (Subsequent) preventive examination.  Virtual Visit via Telephone Note  I connected with [name] on 01/25/20 at 10:40 AM EST by telephone and verified that I am speaking with the correct person using two identifiers.  Medicare Annual Wellness visit completed telephonically due to Covid-19 pandemic.   Location: Patient: home Provider: office   I discussed the limitations, risks, security and privacy concerns of performing an evaluation and management service by telephone and the availability of in person appointments. The patient expressed understanding and agreed to proceed.  Some vital signs may be absent or patient reported.   [name], LPN    Review of Systems:   Cardiac Risk Factors include: advanced age (>2men, >79 women);diabetes mellitus;dyslipidemia;hypertension     Objective:     Vitals: Ht 5\' 2"  (1.575 m)   Wt 151 lb (68.5 kg)   BMI 27.62 kg/m   Body mass index is 27.62 kg/m.  Advanced Directives 01/25/2020 08/28/2018 08/12/2018 08/05/2018 08/04/2018 08/18/2017 07/10/2017  Does Patient Have a Medical Advance Directive? No No No No No No No  Would patient like information on creating a medical advance directive? No - Patient declined No - Patient declined - No - Patient declined No - Patient declined - -  Pre-existing out of facility DNR order (yellow form or pink MOST form) - - - - - - -    Tobacco Social History   Tobacco Use  Smoking Status Never Smoker  Smokeless Tobacco Never Used  Tobacco Comment   smoking cessation materials not required     Counseling given: Not Answered Comment: smoking cessation materials not required   Clinical Intake:  Pre-visit preparation completed: Yes  Pain : No/denies pain     Nutritional Status: BMI 25 -29 Overweight Nutritional Risks: None Diabetes: Yes CBG done?: No Did pt. bring in CBG monitor from home?: No    Nutrition Risk Assessment:  Has the patient had any N/V/D within the last 2 months?  No  Does the patient have any non-healing wounds?  No  Has the patient had any unintentional weight loss or weight gain?  No   Diabetes:  Is the patient diabetic?  Yes  If diabetic, was a CBG obtained today?  No  Did the patient bring in their glucometer from home?  No  How often do you monitor your CBG's? Pt does not actively check blood sugar, only on occasion.   Financial Strains and Diabetes Management:  Are you having any financial strains with the device, your supplies or your medication? No .  Does the patient want to be seen by Chronic Care Management for management of their diabetes?  No  Would the patient like to be referred to a Nutritionist or for Diabetic Management?  No   Diabetic Exams:  Diabetic Eye Exam: Completed 09/23/19.   Diabetic Foot Exam: Completed 10/09/18. Pt has been advised about the importance in completing this exam. Scheduled for 05/17/20.  How often do you need to have someone help you when you read instructions, pamphlets, or other written materials from your doctor or pharmacy?: 1 - Never  Interpreter Needed?: No  Information entered by :: [name] LPN  Past Medical History:  Diagnosis Date  . Allergy   . Arthritis   . Asthma   . B12 deficiency anemia   . Blood transfusion without reported diagnosis   . H/O aortic valve replacement 2013  at [facility]   . History of colon polyps   . History of kidney stones   . HTN (hypertension)   . Hyperlipidemia   . Left nephrolithiasis   . OSA (obstructive sleep apnea)    STUDY 12 YRS CPAP RX-- BUT NONTOLERANT  . Right ureteral stone   . S/P aortic valve replacement    2013  AT [facility]--- CARDIOLOGIST--  DR CRAWFORD [facility]  . Seasonal allergies   . Sleep apnea    wears cpap   . Statin intolerance   . Type 2 diabetes mellitus ([place])    type 2   Past Surgical History:  Procedure Laterality Date  . AORTIC VALVE  REPLACEMENT  06/2012   [facility]  . APPENDECTOMY  1958  . CHOLECYSTECTOMY  1978  . COLONOSCOPY  05-09-2014  . COLONOSCOPY    . CYSTOSCOPY/RETROGRADE/URETEROSCOPY Right 07/04/2014   Procedure: CYSTOSCOPY/RETROGRADE/URETEROSCOPY;  Surgeon: Bernestine Amass, MD;  Location: [facility];  Service: Urology;  Laterality: Right;  . ENDARTERECTOMY Left 08/05/2018   Procedure: ENDARTERECTOMY CAROTID LEFT;  Surgeon: Waynetta Sandy, MD;  Location: [facility];  Service: Vascular;  Laterality: Left;  . ESOPHAGOGASTRODUODENOSCOPY    . EXTRACORPOREAL SHOCK WAVE LITHOTRIPSY Right 06/ 2015     [facility]  . HOLMIUM LASER APPLICATION Right 0000000   Procedure: HOLMIUM LASER APPLICATION;  Surgeon: Bernestine Amass, MD;  Location: [facility];  Service: Urology;  Laterality: Right;  . PATCH ANGIOPLASTY Left 08/05/2018   Procedure: PATCH ANGIOPLASTY USING XENOSURE BIOLOGIC PATCH 1cm x 6cm;  Surgeon: Waynetta Sandy, MD;  Location: [facility];  Service: Vascular;  Laterality: Left;  . POLYPECTOMY    . TONSILLECTOMY  1953  . UPPER GASTROINTESTINAL ENDOSCOPY     Family History  Problem Relation Age of Onset  . Heart disease Mother   . Heart disease Father   . Asthma Daughter   . Asthma Maternal Grandfather   . Heart disease Sister        smoker  . Heart attack Sister 53  . Kidney cancer Sister   . Heart disease Sister        smoker  . Heart disease Sister        smoker  . Stomach cancer Maternal Grandmother   . Colon cancer Neg Hx   . Esophageal cancer Neg Hx   . Rectal cancer Neg Hx   . Colon polyps Neg Hx    Social History   Socioeconomic History  . Marital status: Single    Spouse name: Not on file  . Number of children: 2  . Years of education: some college  . Highest education level: 12th grade  Occupational History  . Occupation: Works for [employer]-retired    Employer: [employer]  Tobacco Use  . Smoking status: Never Smoker  . Smokeless  tobacco: Never Used  . Tobacco comment: smoking cessation materials not required  Substance and Sexual Activity  . Alcohol use: No    Alcohol/week: 0.0 standard drinks  . Drug use: No  . Sexual activity: Not Currently  Other Topics Concern  . Not on file  Social History Narrative   Divorced, 2 children. No longer working    Scientist, physiological Strain: Low Risk   . Difficulty of Paying Living Expenses: Not hard at all  Food Insecurity: No Food Insecurity  . Worried About Charity fundraiser in the Last Year: Never true  . Ran Out  of Food in the Last Year: Never true  Transportation Needs: No Transportation Needs  . Lack of Transportation (Medical): No  . Lack of Transportation (Non-Medical): No  Physical Activity: Sufficiently Active  . Days of Exercise per Week: 5 days  . Minutes of Exercise per Session: 50 min  Stress: No Stress Concern Present  . Feeling of Stress : Not at all  Social Connections: Slightly Isolated  . Frequency of Communication with Friends and Family: More than three times a week  . Frequency of Social Gatherings with Friends and Family: More than three times a week  . Attends Religious Services: More than 4 times per year  . Active Member of Clubs or Organizations: Yes  . Attends Archivist Meetings: More than 4 times per year  . Marital Status: Divorced    Outpatient Encounter Medications as of 01/25/2020  Medication Sig  . albuterol (PROAIR HFA) 108 (90 Base) MCG/ACT inhaler Inhale 2 puffs into the lungs every 4 (four) hours as needed for shortness of breath.  . Ascorbic Acid (VITAMIN C) 100 MG tablet Take 100 mg by mouth daily.  Marland Kitchen aspirin EC 81 MG tablet Take 81 mg by mouth daily.  . B Complex CAPS Take 1 tablet by mouth 2 (two) times daily.  . budesonide (PULMICORT) 0.5 MG/2ML nebulizer solution Take 2 mLs (0.5 mg total) by nebulization as needed.  . Cholecalciferol (VITAMIN D) 2000 UNITS tablet Take 2,000  Units by mouth every other day.   . Coenzyme Q10 (COQ10) 150 MG CAPS Take 150 mg by mouth 2 (two) times daily.   . diclofenac sodium (VOLTAREN) 1 % GEL Apply 2 g topically 4 (four) times daily.  . enalapril (VASOTEC) 10 MG tablet Take 1 tablet (10 mg total) by mouth daily.  Javier Docker Oil 1000 MG CAPS Take 1 capsule by mouth daily.  Marland Kitchen MAG THREONATE-NIACINAMIDE ER PO Take 800 mg by mouth 2 (two) times daily.  . metFORMIN (GLUCOPHAGE) 500 MG tablet Take 1 tablet (500 mg total) by mouth 2 (two) times daily with a meal.  . Probiotic Product (PROBIOTIC DAILY PO) Take 1 capsule by mouth daily.  Marland Kitchen Respiratory Therapy Supplies (NEBULIZER AIR TUBE/PLUGS) MISC 1 each by Does not apply route daily.  . tamsulosin (FLOMAX) 0.4 MG CAPS capsule TAKE 1 CAPSULE(0.4 MG) BY MOUTH DAILY  . amoxicillin (AMOXIL) 500 MG capsule Take 4 capsules by mouth 30 - 60 minutes prior to dental procedures   No facility-administered encounter medications on file as of 01/25/2020.    Activities of Daily Living In your present state of health, do you have any difficulty performing the following activities: 01/25/2020 01/06/2020  Hearing? Y N  Comment pt wears hearing aids -  Vision? N N  Difficulty concentrating or making decisions? N N  Walking or climbing stairs? N N  Dressing or bathing? N N  Doing errands, shopping? N N  Preparing Food and eating ? N -  Using the Toilet? N -  In the past six months, have you accidently leaked urine? N -  Do you have problems with loss of bowel control? N -  Managing your Medications? N -  Managing your Finances? N -  Housekeeping or managing your Housekeeping? N -  Some recent data might be hidden    Patient Care Team: Steele Sizer, MD as PCP - General (Family Medicine) [name], MD as PCP - Cardiology (Cardiology) Gavin Pound, MD as Consulting Physician (Rheumatology) Angelia Mould, MD as  Consulting Physician (Vascular Surgery)    Assessment:   This  is a routine wellness examination for [place].  Exercise Activities and Dietary recommendations Current Exercise Habits: Home exercise routine, Type of exercise: walking;strength training/weights, Time (Minutes): 50, Frequency (Times/Week): 5, Weekly Exercise (Minutes/Week): 250, Intensity: Moderate, Exercise limited by: None identified  Goals    . DIET - INCREASE WATER INTAKE     Recommend to drink at least 6-8 8oz glasses of water per day.       Fall Risk Fall Risk  01/25/2020 01/06/2020 11/17/2019 11/03/2019 10/09/2018  Falls in the past year? 0 0 0 0 0  Comment - - - Emmi Telephone Survey: data to providers prior to load -  Number falls in past yr: 0 0 0 - -  Injury with Fall? 0 0 0 - -  Risk for fall due to : No Fall Risks - - - -  Risk for fall due to: Comment - - - - -  Follow up Falls prevention discussed - - - -   FALL RISK PREVENTION PERTAINING TO THE HOME:  Any stairs in or around the home? Yes  If so, do they handrails? Yes   Home free of loose throw rugs in walkways, pet beds, electrical cords, etc? Yes  Adequate lighting in your home to reduce risk of falls? Yes   ASSISTIVE DEVICES UTILIZED TO PREVENT FALLS:  Life alert? No  Use of a cane, walker or w/c? No  Grab bars in the bathroom? No  Shower chair or bench in shower? No  Elevated toilet seat or a handicapped toilet? No   DME ORDERS:  DME order needed?  No   TIMED UP AND GO:  Was the test performed? No . Telephonic visit.    Education: Fall risk prevention has been discussed.  Intervention(s) required? No   Depression Screen PHQ 2/9 Scores 01/25/2020 01/06/2020 11/17/2019 04/09/2019  PHQ - 2 Score 0 0 0 0  PHQ- 9 Score - 0 0 0     Cognitive Function - 6CIT deferred for 2021 AWV; pt has no memory issues        Immunization History  Administered Date(s) Administered  . Pneumococcal Conjugate-13 01/06/2014  . Pneumococcal Polysaccharide-23 04/05/2010, 08/24/2015  . Tdap 12/12/2006  . Zoster  01/06/2014    Qualifies for Shingles Vaccine? Yes  Zostavax completed 2015. Due for Shingrix. Education has been provided regarding the importance of this vaccine. Pt has been advised to call insurance company to determine out of pocket expense. Advised may also receive vaccine at local pharmacy or Health Dept. Verbalized acceptance and understanding.  Tdap: Although this vaccine is not a covered service during a Wellness Exam, does the patient still wish to receive this vaccine today?  No .  Education has been provided regarding the importance of this vaccine. Advised may receive this vaccine at local pharmacy or Health Dept. Aware to provide a copy of the vaccination record if obtained from local pharmacy or Health Dept. Verbalized acceptance and understanding.  Flu Vaccine: Due for Flu vaccine. Does the patient want to receive this vaccine today?  No . Education has been provided regarding the importance of this vaccine but still declined. Advised may receive this vaccine at local pharmacy or Health Dept. Aware to provide a copy of the vaccination record if obtained from local pharmacy or Health Dept. Verbalized acceptance and understanding.  Pneumococcal Vaccine: Up to date   Screening Tests Health Maintenance  Topic Date Due  . MAMMOGRAM  03/20/1964  . FOOT EXAM  10/10/2019  . INFLUENZA VACCINE  03/08/2020 (Originally 07/10/2019)  . TETANUS/TDAP  11/16/2020 (Originally 12/12/2016)  . HEMOGLOBIN A1C  07/20/2020  . OPHTHALMOLOGY EXAM  09/22/2020  . COLONOSCOPY  01/02/2024  . DEXA SCAN  Completed  . Hepatitis C Screening  Completed  . PNA vac Low Risk Adult  Completed    Cancer Screenings:  Colorectal Screening: Completed 01/01/19. Repeat every 5 years;   Mammogram: scheduled for 02/17/20  Bone Density: Completed 2008. Results reflect unavailable. Repeat every 2 years. Ordered today. Pt provided with contact information and advised to call to schedule appt.   Lung Cancer Screening:  (Low Dose CT Chest recommended if Age 4-80 years, 30 pack-year currently smoking OR have quit w/in 15years.) does not qualify.   Additional Screening:  Hepatitis C Screening: does qualify; Completed 09/24/12  Vision Screening: Recommended annual ophthalmology exams for early detection of glaucoma and other disorders of the eye. Is the patient up to date with their annual eye exam?  Yes  Who is the provider or what is the name of the office in which the pt attends annual eye exams? Dr. Katy Apo  Dental Screening: Recommended annual dental exams for proper oral hygiene  Community Resource Referral:  CRR required this visit?  No      Plan:     I have personally reviewed and addressed the Medicare Annual Wellness questionnaire and have noted the following in the patient's chart:  A. Medical and social history B. Use of alcohol, tobacco or illicit drugs  C. Current medications and supplements D. Functional ability and status E.  Nutritional status F.  Physical activity G. Advance directives H. List of other physicians I.  Hospitalizations, surgeries, and ER visits in previous 12 months J.  [place] such as hearing and vision if needed, cognitive and depression L. Referrals and appointments   In addition, I have reviewed and discussed with patient certain preventive protocols, quality metrics, and best practice recommendations. A written personalized care plan for preventive services as well as general preventive health recommendations were provided to patient.   Signed,  [name], LPN Nurse Health Advisor   Nurse Notes: pt scheduled for cataract surgery next week and in mid march.

## 2020-02-01 DIAGNOSIS — H52201 Unspecified astigmatism, right eye: Secondary | ICD-10-CM | POA: Diagnosis not present

## 2020-02-01 DIAGNOSIS — H2511 Age-related nuclear cataract, right eye: Secondary | ICD-10-CM | POA: Diagnosis not present

## 2020-02-01 DIAGNOSIS — H25811 Combined forms of age-related cataract, right eye: Secondary | ICD-10-CM | POA: Diagnosis not present

## 2020-02-01 DIAGNOSIS — H25011 Cortical age-related cataract, right eye: Secondary | ICD-10-CM | POA: Diagnosis not present

## 2020-02-07 ENCOUNTER — Other Ambulatory Visit (INDEPENDENT_AMBULATORY_CARE_PROVIDER_SITE_OTHER): Payer: Medicare Other

## 2020-02-07 DIAGNOSIS — R002 Palpitations: Secondary | ICD-10-CM

## 2020-02-07 DIAGNOSIS — I6523 Occlusion and stenosis of bilateral carotid arteries: Secondary | ICD-10-CM

## 2020-02-07 DIAGNOSIS — I350 Nonrheumatic aortic (valve) stenosis: Secondary | ICD-10-CM

## 2020-02-07 DIAGNOSIS — E785 Hyperlipidemia, unspecified: Secondary | ICD-10-CM

## 2020-02-07 DIAGNOSIS — I10 Essential (primary) hypertension: Secondary | ICD-10-CM

## 2020-02-17 ENCOUNTER — Ambulatory Visit: Payer: Medicare Other

## 2020-02-21 ENCOUNTER — Other Ambulatory Visit: Payer: Self-pay

## 2020-02-21 ENCOUNTER — Ambulatory Visit (HOSPITAL_COMMUNITY): Payer: Medicare Other | Attending: Internal Medicine

## 2020-02-21 DIAGNOSIS — R002 Palpitations: Secondary | ICD-10-CM | POA: Diagnosis not present

## 2020-02-21 DIAGNOSIS — I6523 Occlusion and stenosis of bilateral carotid arteries: Secondary | ICD-10-CM | POA: Diagnosis not present

## 2020-02-21 DIAGNOSIS — I10 Essential (primary) hypertension: Secondary | ICD-10-CM | POA: Insufficient documentation

## 2020-02-21 DIAGNOSIS — I350 Nonrheumatic aortic (valve) stenosis: Secondary | ICD-10-CM | POA: Insufficient documentation

## 2020-02-21 DIAGNOSIS — E785 Hyperlipidemia, unspecified: Secondary | ICD-10-CM | POA: Insufficient documentation

## 2020-02-29 DIAGNOSIS — H25812 Combined forms of age-related cataract, left eye: Secondary | ICD-10-CM | POA: Diagnosis not present

## 2020-02-29 DIAGNOSIS — H25012 Cortical age-related cataract, left eye: Secondary | ICD-10-CM | POA: Diagnosis not present

## 2020-02-29 DIAGNOSIS — H52202 Unspecified astigmatism, left eye: Secondary | ICD-10-CM | POA: Diagnosis not present

## 2020-02-29 DIAGNOSIS — H2512 Age-related nuclear cataract, left eye: Secondary | ICD-10-CM | POA: Diagnosis not present

## 2020-03-01 DIAGNOSIS — R002 Palpitations: Secondary | ICD-10-CM | POA: Diagnosis not present

## 2020-03-11 ENCOUNTER — Other Ambulatory Visit: Payer: Self-pay | Admitting: Family Medicine

## 2020-03-11 DIAGNOSIS — I10 Essential (primary) hypertension: Secondary | ICD-10-CM

## 2020-03-11 NOTE — Telephone Encounter (Signed)
Requested Prescriptions  Pending Prescriptions Disp Refills  . enalapril (VASOTEC) 10 MG tablet [Pharmacy Med Name: ENALAPRIL 10MG  TABLETS] 90 tablet 1    Sig: TAKE 1 TABLET(10 MG) BY MOUTH DAILY     Cardiovascular:  ACE Inhibitors Passed - 03/11/2020  7:29 PM      Passed - Cr in normal range and within 180 days    Creat  Date Value Ref Range Status  07/07/2018 0.85 0.60 - 0.93 mg/dL Final    Comment:    For patients >73 years of age, the reference limit for Creatinine is approximately 13% higher for people identified as African-American. .    Creatinine, Ser  Date Value Ref Range Status  01/21/2020 0.69 0.57 - 1.00 mg/dL Final   Creatinine, Urine  Date Value Ref Range Status  10/09/2018 94 20 - 275 mg/dL Final         Passed - K in normal range and within 180 days    Potassium  Date Value Ref Range Status  01/21/2020 4.2 3.5 - 5.2 mmol/L Final  12/24/2014 4.0 3.5 - 5.1 mmol/L Final         Passed - Patient is not pregnant      Passed - Last BP in normal range    BP Readings from Last 1 Encounters:  01/21/20 126/82         Passed - Valid encounter within last 6 months    Recent Outpatient Visits          2 months ago Right flank pain   [facility] [place], Drue Stager, MD   3 months ago Dyslipidemia associated with type 2 diabetes mellitus [facility])   [facility] Steele Sizer, MD   11 months ago Hypertension, benign   [facility] [place], Drue Stager, MD   1 year ago Diabetes mellitus type 2 in obese [facility])   [facility] Steele Sizer, MD   1 year ago Great toe pain, left   [place], [place]      Future Appointments            In 2 months Ancil Boozer, Drue Stager, MD [facility] [place]   In 10 months  [facility], [facility]

## 2020-04-20 ENCOUNTER — Other Ambulatory Visit: Payer: Self-pay

## 2020-04-20 ENCOUNTER — Ambulatory Visit
Admission: RE | Admit: 2020-04-20 | Discharge: 2020-04-20 | Disposition: A | Discharge status: Home / Self Care | Payer: Medicare Other | Source: Ambulatory Visit | Attending: Family Medicine | Admitting: Family Medicine

## 2020-04-20 DIAGNOSIS — Z1231 Encounter for screening mammogram for malignant neoplasm of breast: Secondary | ICD-10-CM

## 2020-04-20 DIAGNOSIS — Z780 Asymptomatic menopausal state: Secondary | ICD-10-CM | POA: Diagnosis not present

## 2020-04-20 DIAGNOSIS — M85851 Other specified disorders of bone density and structure, right thigh: Secondary | ICD-10-CM | POA: Diagnosis not present

## 2020-05-17 ENCOUNTER — Ambulatory Visit (INDEPENDENT_AMBULATORY_CARE_PROVIDER_SITE_OTHER): Payer: Medicare Other | Admitting: Family Medicine

## 2020-05-17 ENCOUNTER — Other Ambulatory Visit: Payer: Self-pay

## 2020-05-17 ENCOUNTER — Encounter: Payer: Self-pay | Admitting: Family Medicine

## 2020-05-17 VITALS — BP 108/76 | HR 74 | Temp 98.1°F | Resp 14 | Ht 62.0 in | Wt 152.1 lb

## 2020-05-17 DIAGNOSIS — G4733 Obstructive sleep apnea (adult) (pediatric): Secondary | ICD-10-CM

## 2020-05-17 DIAGNOSIS — J4520 Mild intermittent asthma, uncomplicated: Secondary | ICD-10-CM

## 2020-05-17 DIAGNOSIS — T466X5A Adverse effect of antihyperlipidemic and antiarteriosclerotic drugs, initial encounter: Secondary | ICD-10-CM | POA: Diagnosis not present

## 2020-05-17 DIAGNOSIS — M791 Myalgia, unspecified site: Secondary | ICD-10-CM | POA: Diagnosis not present

## 2020-05-17 DIAGNOSIS — E1169 Type 2 diabetes mellitus with other specified complication: Secondary | ICD-10-CM

## 2020-05-17 DIAGNOSIS — I10 Essential (primary) hypertension: Secondary | ICD-10-CM | POA: Diagnosis not present

## 2020-05-17 DIAGNOSIS — Z952 Presence of prosthetic heart valve: Secondary | ICD-10-CM

## 2020-05-17 DIAGNOSIS — E785 Hyperlipidemia, unspecified: Secondary | ICD-10-CM

## 2020-05-17 DIAGNOSIS — M064 Inflammatory polyarthropathy: Secondary | ICD-10-CM | POA: Diagnosis not present

## 2020-05-17 DIAGNOSIS — D692 Other nonthrombocytopenic purpura: Secondary | ICD-10-CM

## 2020-05-17 DIAGNOSIS — I6523 Occlusion and stenosis of bilateral carotid arteries: Secondary | ICD-10-CM

## 2020-05-17 LAB — POCT GLYCOSYLATED HEMOGLOBIN (HGB A1C): Hemoglobin A1C: 4.9 % (ref 4.0–5.6)

## 2020-05-17 MED ORDER — ENALAPRIL MALEATE 5 MG PO TABS: 5.0000 mg | ORAL_TABLET | Freq: Every day | ORAL | 1 refills | Status: DC

## 2020-05-17 MED ORDER — MORINGA 500 MG PO CAPS: 1.0000 | ORAL_CAPSULE | Freq: Every day | ORAL | 0 refills | Status: AC

## 2020-05-17 NOTE — Progress Notes (Signed)
Name: Bailey [name]   MRN: [id number]    DOB: 15-Apr-1946   Date:05/17/2020       Progress Note  Subjective  Chief Complaint  Chief Complaint  Patient presents with  . Follow-up  . Hypertension  . Diabetes    HPI  DMII: she has lost weight. A1C is below 5 %, advised to stop Metformin, but she has only been taking it when she will have a larger meal such as holidays , she is still  doing intermittent fasting .She has not been checking her glucose at home but denies polyphagia, polydipsia or polyuria.She has been physically active, walking daily.On Ace for kidney protection, for  Dyslipidemia  she only believes in fish oil, she has  intolerance to statins .Discussed PKS9 and also Ezetimide in the past  She had one episode of dizziness after she mowed her yard.   AsthmaMild: she is usesPulmicort prn,she was exposed to peppermint oil and had a flare recently Doing well today, no cough, wheezing or SOB at this time. Also using albuterol prior to activity  Dizziness: she was cutting her grass last week and developed diaphoresis and dizziness once she stopped, no chest pain or indigestion. She states sometimes when walking with her grandchildren has a burning sensation on her chest, occasionally.  She will discuss it with her cardiologist and ask about a stress test.   HTN: taking medication and denies side effects of medication, bp today is towards low end of normal, we will decrease ace dose by half and monitor.   Overweight : sheis physically active, she has lost 15 lbs since 01 /2020 - but has been stable for the past 4 months. Down from 167 12/2018 to now at 152 lbs   she is trying to stay active and intermittent fasting from noon to 4 pm ( 18 -20 hours fasting period ) . No longer obese  Dyslipidemia: unable to tolerate statin therapy, she is taking fish oil only. She is aware of importance of medication, she refuses Zetia of PKS9 injection.Unchanged  History of carotid  endarterectomy left side: recent visit with vascular surgeon showed carotid artery duplex which demonstrates 40 to 59% stenosis on the right with peak systolic velocity 96. On the left peak systolic velocity 96 and 40 to 59% stenosis as well at the site of the endarterectomy.  Hearing loss: seen by ENT, needs hearing aid but can't afford it at this time Unchanged   Inflammatory arthritis : she was seeing Rheumatologist and was taking Plaquenil but she stopped taking it on her own. She states no longer needs to go. She states her aches and pain are stable. She had [place] of her wrist and it was very expensive .She is doing well with moringa capsules. No longer having generalized joint aches - no longer has PMR   Patient Active Problem List   Diagnosis Date Noted  . Dyslipidemia associated with type 2 diabetes mellitus ([place]) 05/17/2020  . Recurrent pancreatitis 05/26/2019  . Left carotid artery stenosis 08/05/2018  . OSA (obstructive sleep apnea) 01/20/2017  . Carotid atherosclerosis, bilateral 01/20/2017  . Abnormal brain MRI 08/26/2016  . Inflammatory polyarthritis ([place]) 11/12/2015  . Erosive osteoarthritis of hand 11/12/2015  . History of vitamin D deficiency 11/06/2015  . Hearing loss 08/24/2015  . Asthma, mild intermittent 08/23/2015  . Arthritis, degenerative 08/23/2015  . Dyslipidemia 08/23/2015  . H/O: hypothyroidism 08/23/2015  . Obesity (BMI 30-39.9) 08/23/2015  . HZV (herpes zoster virus) post herpetic neuralgia 08/23/2015  .  History of renal stone 04/01/2014  . Diabetes mellitus, controlled ([place]) 04/01/2014  . Essential hypertension, benign 04/01/2014  . Aortic stenosis 06/16/2012  . H/O prosthetic heart valve 06/15/2012    Past Surgical History:  Procedure Laterality Date  . AORTIC VALVE REPLACEMENT  06/2012   [facility]  . APPENDECTOMY  1958  . CHOLECYSTECTOMY  1978  . COLONOSCOPY  05-09-2014  . COLONOSCOPY    . CYSTOSCOPY/RETROGRADE/URETEROSCOPY Right 07/04/2014    Procedure: CYSTOSCOPY/RETROGRADE/URETEROSCOPY;  Surgeon: Bernestine Amass, MD;  Location: [facility];  Service: Urology;  Laterality: Right;  . ENDARTERECTOMY Left 08/05/2018   Procedure: ENDARTERECTOMY CAROTID LEFT;  Surgeon: Waynetta Sandy, MD;  Location: [place];  Service: Vascular;  Laterality: Left;  . ESOPHAGOGASTRODUODENOSCOPY    . EXTRACORPOREAL SHOCK WAVE LITHOTRIPSY Right 06/ 2015     [place]  . HOLMIUM LASER APPLICATION Right 0/35/0093   Procedure: HOLMIUM LASER APPLICATION;  Surgeon: Bernestine Amass, MD;  [facility];  Service: Urology;  Laterality: Right;  . PATCH ANGIOPLASTY Left 08/05/2018   Procedure: PATCH ANGIOPLASTY USING XENOSURE BIOLOGIC PATCH 1cm x 6cm;  Surgeon: Waynetta Sandy, MD;  Location: [place];  Service: Vascular;  Laterality: Left;  . POLYPECTOMY    . TONSILLECTOMY  1953  . UPPER GASTROINTESTINAL ENDOSCOPY      Family History  Problem Relation Age of Onset  . Heart disease Mother   . Heart disease Father   . Asthma Daughter   . Asthma Maternal Grandfather   . Heart disease Sister        smoker  . Heart attack Sister 3  . Kidney cancer Sister   . Heart disease Sister        smoker  . Heart disease Sister        smoker  . Stomach cancer Maternal Grandmother   . Colon cancer Neg Hx   . Esophageal cancer Neg Hx   . Rectal cancer Neg Hx   . Colon polyps Neg Hx     Social History   Tobacco Use  . Smoking status: Never Smoker  . Smokeless tobacco: Never Used  . Tobacco comment: smoking cessation materials not required  Substance Use Topics  . Alcohol use: No    Alcohol/week: 0.0 standard drinks     Current Outpatient Medications:  .  albuterol (PROAIR HFA) 108 (90 Base) MCG/ACT inhaler, Inhale 2 puffs into the lungs every 4 (four) hours as needed for shortness of breath., Disp: 1 Inhaler, Rfl: 2 .  Ascorbic Acid (VITAMIN C) 100 MG tablet, Take 100 mg by mouth daily., Disp: , Rfl:  .   aspirin EC 81 MG tablet, Take 81 mg by mouth daily., Disp: , Rfl:  .  B Complex CAPS, Take 1 tablet by mouth 2 (two) times daily., Disp: , Rfl:  .  budesonide (PULMICORT) 0.5 MG/2ML nebulizer solution, Take 2 mLs (0.5 mg total) by nebulization as needed., Disp: 120 mL, Rfl: 1 .  Cholecalciferol (VITAMIN D) 2000 UNITS tablet, Take 2,000 Units by mouth every other day. , Disp: , Rfl:  .  Coenzyme Q10 (COQ10) 150 MG CAPS, Take 150 mg by mouth 2 (two) times daily. , Disp: , Rfl:  .  diclofenac sodium (VOLTAREN) 1 % GEL, Apply 2 g topically 4 (four) times daily., Disp: 1 Tube, Rfl: 0 .  enalapril (VASOTEC) 5 MG tablet, Take 1 tablet (5 mg total) by mouth daily., Disp: 90 tablet, Rfl: 1 .  Krill Oil 1000 MG  CAPS, Take 1 capsule by mouth daily., Disp: , Rfl:  .  MAG THREONATE-NIACINAMIDE ER PO, Take 800 mg by mouth 2 (two) times daily., Disp: , Rfl:  .  metFORMIN (GLUCOPHAGE) 500 MG tablet, Take 1 tablet (500 mg total) by mouth 2 (two) times daily with a meal., Disp: 180 tablet, Rfl: 0 .  Probiotic Product (PROBIOTIC DAILY PO), Take 1 capsule by mouth daily., Disp: , Rfl:  .  Respiratory Therapy Supplies (NEBULIZER AIR TUBE/PLUGS) MISC, 1 each by Does not apply route daily., Disp: 1 each, Rfl: 2 .  Moringa 500 MG CAPS, Take 1 capsule by mouth daily., Disp: 30 capsule, Rfl: 0  Allergies  Allergen Reactions  . Dilaudid [Hydromorphone Hcl] Other (See Comments)    Severe hypotension - pt states that if she is given this medication it will kill her  . Naproxen Shortness Of Breath  . Nsaids Shortness Of Breath and Other (See Comments)    Causes asthma  . Percocet [Oxycodone-Acetaminophen] Shortness Of Breath    Can tolerate oxy IR  . Latex Hives  . Other Swelling    Eye drop preservative Specific agent unspecified  . Rosuvastatin Other (See Comments)    myalgia  . Morphine And Related     Pain with IM injection    I personally reviewed active problem list, medication list, allergies, family  history, social history, health maintenance with the patient/caregiver today.   ROS  Constitutional: Negative for fever , positive for weight change - over the past year .  Respiratory: Negative for cough and shortness of breath.   Cardiovascular: Negative for chest pain or palpitations.  Gastrointestinal: Negative for abdominal pain, no bowel changes.  Musculoskeletal: Negative for gait problem or joint swelling.  Skin: Negative for rash.  Neurological: Negative for dizziness ( had one recent episode)  or headache.  No other specific complaints in a complete review of systems (except as listed in HPI above).  Objective  Vitals:   05/17/20 0910  BP: 108/76  Pulse: 74  Resp: 14  Temp: 98.1 F (36.7 C)  TempSrc: Temporal  SpO2: 98%  Weight: 152 lb 1.6 oz (69 kg)  Height: 5\' 2"  (1.575 m)    Body mass index is 27.82 kg/m.  Physical Exam  Constitutional: Patient appears well-developed and well-nourished. Overweight. No distress.  HEENT: head atraumatic, normocephalic, pupils equal and reactive to light,  neck supple, throat within normal limits Cardiovascular: Normal rate, regular rhythm and normal heart sounds.  No murmur heard. No BLE edema. Pulmonary/Chest: Effort normal and breath sounds normal. No respiratory distress. Abdominal: Soft.  There is no tenderness. Muscular Skeletal: hand deformities , no effusion at this time Skin: senile purpura notice on her arm Psychiatric: Patient has a normal mood and affect. behavior is normal. Judgment and thought content normal.  Diabetic Foot Exam: Diabetic Foot Exam - Simple   Simple Foot Form Diabetic Foot exam was performed with the following findings: Yes 05/17/2020 10:05 AM  Visual Inspection No deformities, no ulcerations, no other skin breakdown bilaterally: Yes Sensation Testing Intact to touch and monofilament testing bilaterally: Yes Pulse Check Posterior Tibialis and Dorsalis pulse intact bilaterally: Yes Comments       PHQ2/9: Depression screen [facility] 2/9 05/17/2020 01/25/2020 01/06/2020 11/17/2019 04/09/2019  Decreased Interest 0 0 0 0 0  Down, Depressed, Hopeless 0 0 0 0 -  PHQ - 2 Score 0 0 0 0 0  Altered sleeping 0 - 0 0 0  Tired, decreased energy 0 -  0 0 0  Change in appetite 0 - 0 0 0  Feeling bad or failure about yourself  0 - 0 0 0  Trouble concentrating 0 - 0 0 0  Moving slowly or fidgety/restless 0 - 0 0 0  Suicidal thoughts 0 - 0 0 0  PHQ-9 Score 0 - 0 0 0  Difficult doing work/chores - - - - -    phq 9 is negative  Fall Risk: Fall Risk  05/17/2020 01/25/2020 01/06/2020 11/17/2019 11/03/2019  Falls in the past year? 0 0 0 0 0  Comment - - - - Emmi Telephone Survey: data to providers prior to load  Number falls in past yr: - 0 0 0 -  Injury with Fall? - 0 0 0 -  Risk for fall due to : - No Fall Risks - - -  Risk for fall due to: Comment - - - - -  Follow up - Falls prevention discussed - - -    Functional Status Survey: Is the patient deaf or have difficulty hearing?: No Does the patient have difficulty seeing, even when wearing glasses/contacts?: No Does the patient have difficulty concentrating, remembering, or making decisions?: No Does the patient have difficulty walking or climbing stairs?: No Does the patient have difficulty dressing or bathing?: No Does the patient have difficulty doing errands alone such as visiting a doctor's office or shopping?: No    Assessment & Plan  1. Dyslipidemia associated with type 2 diabetes mellitus (HCC)  - POCT HgB A1C  2. OSA (obstructive sleep apnea)   3. History of aortic valve replacement  Seeing cardiovascular surgeon   4. Carotid atherosclerosis, bilateral   5. Mild intermittent asthma without complication  stable  6. Inflammatory polyarthritis ([place])  Refuses rx medication  - Moringa 500 MG CAPS; Take 1 capsule by mouth daily.  Dispense: 30 capsule; Refill: 0  7. H/O prosthetic heart valve   8. Hypertension,  benign  - enalapril (VASOTEC) 5 MG tablet; Take 1 tablet (5 mg total) by mouth daily.  Dispense: 90 tablet; Refill: 1  9. Myalgia due to statin   10. Senile purpura ([place])  She is aware that is common in her age group and was given reassurance

## 2020-07-05 DIAGNOSIS — H6123 Impacted cerumen, bilateral: Secondary | ICD-10-CM | POA: Diagnosis not present

## 2020-08-06 DIAGNOSIS — Z20822 Contact with and (suspected) exposure to covid-19: Secondary | ICD-10-CM | POA: Diagnosis not present

## 2020-08-07 ENCOUNTER — Telehealth: Payer: Self-pay

## 2020-08-07 NOTE — Telephone Encounter (Signed)
Copied from [place] [phone]. Topic: General - Inquiry >> Aug 07, 2020  2:09 PM Scherrie Gerlach wrote: Reason for CRM: pt is going to work part time for the [employer] and needs a one page health form filled out.  She is going to fax to the office, and call me back with the fax number she needs it sent to. She says there is not much to it.   No form has been received at this time.

## 2020-08-21 ENCOUNTER — Other Ambulatory Visit: Payer: Self-pay

## 2020-08-29 ENCOUNTER — Other Ambulatory Visit: Payer: Medicare Other

## 2020-08-29 DIAGNOSIS — Z20822 Contact with and (suspected) exposure to covid-19: Secondary | ICD-10-CM | POA: Diagnosis not present

## 2020-08-31 LAB — SARS-COV-2, NAA 2 DAY TAT

## 2020-08-31 LAB — NOVEL CORONAVIRUS, NAA: SARS-CoV-2, NAA: NOT DETECTED

## 2020-10-19 DIAGNOSIS — H6123 Impacted cerumen, bilateral: Secondary | ICD-10-CM | POA: Diagnosis not present

## 2020-10-19 DIAGNOSIS — H838X3 Other specified diseases of inner ear, bilateral: Secondary | ICD-10-CM | POA: Diagnosis not present

## 2020-10-19 DIAGNOSIS — H903 Sensorineural hearing loss, bilateral: Secondary | ICD-10-CM | POA: Diagnosis not present

## 2020-10-24 ENCOUNTER — Ambulatory Visit (INDEPENDENT_AMBULATORY_CARE_PROVIDER_SITE_OTHER): Payer: Medicare Other

## 2020-10-24 ENCOUNTER — Encounter (HOSPITAL_COMMUNITY): Payer: Self-pay

## 2020-10-24 ENCOUNTER — Ambulatory Visit (HOSPITAL_COMMUNITY)
Admission: EM | Admit: 2020-10-24 | Discharge: 2020-10-24 | Disposition: A | Discharge status: Home / Self Care | Payer: Medicare Other | Attending: Family Medicine | Admitting: Family Medicine

## 2020-10-24 ENCOUNTER — Other Ambulatory Visit: Payer: Self-pay

## 2020-10-24 DIAGNOSIS — Z79899 Other long term (current) drug therapy: Secondary | ICD-10-CM | POA: Insufficient documentation

## 2020-10-24 DIAGNOSIS — Z9889 Other specified postprocedural states: Secondary | ICD-10-CM | POA: Insufficient documentation

## 2020-10-24 DIAGNOSIS — J4520 Mild intermittent asthma, uncomplicated: Secondary | ICD-10-CM | POA: Insufficient documentation

## 2020-10-24 DIAGNOSIS — Z20822 Contact with and (suspected) exposure to covid-19: Secondary | ICD-10-CM | POA: Diagnosis not present

## 2020-10-24 DIAGNOSIS — R0602 Shortness of breath: Secondary | ICD-10-CM

## 2020-10-24 DIAGNOSIS — B349 Viral infection, unspecified: Secondary | ICD-10-CM | POA: Diagnosis not present

## 2020-10-24 DIAGNOSIS — R059 Cough, unspecified: Secondary | ICD-10-CM | POA: Diagnosis not present

## 2020-10-24 LAB — RESP PANEL BY RT PCR (RSV, FLU A&B, COVID)
Influenza A by PCR: NEGATIVE
Influenza B by PCR: NEGATIVE
Respiratory Syncytial Virus by PCR: NEGATIVE
SARS Coronavirus 2 by RT PCR: NEGATIVE

## 2020-10-24 NOTE — ED Triage Notes (Signed)
Pt presents with generalized body aches & weakness and some slight shortness of breath X 2 days.

## 2020-10-24 NOTE — Discharge Instructions (Signed)
Your x ray was normal I believe this is something viral We are checking you for covid  Rest, Hydrate Follow up as needed for continued or worsening symptoms

## 2020-10-25 NOTE — ED Provider Notes (Signed)
[place]    CSN: [id number] Arrival date & time: 10/24/20  9323      History   Chief Complaint Chief Complaint  Patient presents with   Shortness of Breath   Generalized Body Aches   Body Weakness    HPI Gabriell Daigneault is a 74 y.o. female.   Patient is a 74 year old female has a history of allergy, asthma, arthritis, hypertension, hyperlipidemia, OSA.  She presents today with approximately 2 days of generalized body aches, weakness, cough and shortness of breath.  Symptoms have been constant.  She has been COVID vaccinated.  Denies any productive cough, lower extremity edema, fevers, chills, body aches, sore throat, ear pain, nasal congestion or rhinorrhea.     Past Medical History:  Diagnosis Date   Allergy    Arthritis    Asthma    B12 deficiency anemia    Blood transfusion without reported diagnosis    H/O aortic valve replacement 2013   at [place]    History of colon polyps    History of kidney stones    HTN (hypertension)    Hyperlipidemia    Left nephrolithiasis    OSA (obstructive sleep apnea)    STUDY 12 YRS CPAP RX-- BUT NONTOLERANT   Right ureteral stone    S/P aortic valve replacement    2013  AT [facility]--- CARDIOLOGIST--  DR CRAWFORD [facility]   Seasonal allergies    Sleep apnea    wears cpap    Statin intolerance    Type 2 diabetes mellitus ([place])    type 2    Patient Active Problem List   Diagnosis Date Noted   Dyslipidemia associated with type 2 diabetes mellitus ([place]) 05/17/2020   Recurrent pancreatitis 05/26/2019   Left carotid artery stenosis 08/05/2018   OSA (obstructive sleep apnea) 01/20/2017   Carotid atherosclerosis, bilateral 01/20/2017   Abnormal brain MRI 08/26/2016   Inflammatory polyarthritis ([place]) 11/12/2015   Erosive osteoarthritis of hand 11/12/2015   History of vitamin D deficiency 11/06/2015   Hearing loss 08/24/2015   Asthma, mild intermittent 08/23/2015   Arthritis,  degenerative 08/23/2015   Dyslipidemia 08/23/2015   H/O: hypothyroidism 08/23/2015   Obesity (BMI 30-39.9) 08/23/2015   HZV (herpes zoster virus) post herpetic neuralgia 08/23/2015   History of renal stone 04/01/2014   Diabetes mellitus, controlled ([place]) 04/01/2014   Essential hypertension, benign 04/01/2014   Aortic stenosis 06/16/2012   H/O prosthetic heart valve 06/15/2012    Past Surgical History:  Procedure Laterality Date   AORTIC VALVE REPLACEMENT  06/2012   [place]   COLONOSCOPY  05-09-2014   COLONOSCOPY     CYSTOSCOPY/RETROGRADE/URETEROSCOPY Right 07/04/2014   Procedure: CYSTOSCOPY/RETROGRADE/URETEROSCOPY;  Surgeon: Bernestine Amass, MD;  Location: [facility];  Service: Urology;  Laterality: Right;   ENDARTERECTOMY Left 08/05/2018   Procedure: ENDARTERECTOMY CAROTID LEFT;  Surgeon: Waynetta Sandy, MD;  Location: [place];  Service: Vascular;  Laterality: Left;   ESOPHAGOGASTRODUODENOSCOPY     EXTRACORPOREAL SHOCK WAVE LITHOTRIPSY Right 06/ 2015     [place]   HOLMIUM LASER APPLICATION Right 5/57/3220   Procedure: HOLMIUM LASER APPLICATION;  Surgeon: Bernestine Amass, MD;  Location: [facility];  Service: Urology;  Laterality: Right;   PATCH ANGIOPLASTY Left 08/05/2018   Procedure: PATCH ANGIOPLASTY USING XENOSURE BIOLOGIC PATCH 1cm x 6cm;  Surgeon: Waynetta Sandy, MD;  Location: [place];  Service: Vascular;  Laterality: Left;  POLYPECTOMY     TONSILLECTOMY  1953   UPPER GASTROINTESTINAL ENDOSCOPY      OB History   No obstetric history on file.      Home Medications    Prior to Admission medications   Medication Sig Start Date End Date Taking? Authorizing Provider  albuterol (PROAIR HFA) 108 (90 Base) MCG/ACT inhaler Inhale 2 puffs into the lungs every 4 (four) hours as needed for shortness of breath. 02/11/19   Steele Sizer, MD  Ascorbic Acid (VITAMIN C) 100  MG tablet Take 100 mg by mouth daily.    [provider]  aspirin EC 81 MG tablet Take 81 mg by mouth daily.    [provider]  B Complex CAPS Take 1 tablet by mouth 2 (two) times daily.    [provider]  budesonide (PULMICORT) 0.5 MG/2ML nebulizer solution Take 2 mLs (0.5 mg total) by nebulization as needed. 11/17/19   Steele Sizer, MD  Cholecalciferol (VITAMIN D) 2000 UNITS tablet Take 2,000 Units by mouth every other day.     [provider]  Coenzyme Q10 (COQ10) 150 MG CAPS Take 150 mg by mouth 2 (two) times daily.     [provider]  diclofenac sodium (VOLTAREN) 1 % GEL Apply 2 g topically 4 (four) times daily. 07/10/18   Tasia Catchings, Amy V, PA-C  enalapril (VASOTEC) 5 MG tablet Take 1 tablet (5 mg total) by mouth daily. 05/17/20   Steele Sizer, MD  Krill Oil 1000 MG CAPS Take 1 capsule by mouth daily.    [provider]  MAG THREONATE-NIACINAMIDE ER PO Take 800 mg by mouth 2 (two) times daily.    [provider]  metFORMIN (GLUCOPHAGE) 500 MG tablet Take 1 tablet (500 mg total) by mouth 2 (two) times daily with a meal. 11/17/19   Sowles, Drue Stager, MD  Moringa 500 MG CAPS Take 1 capsule by mouth daily. 05/17/20   Steele Sizer, MD  Probiotic Product (PROBIOTIC DAILY PO) Take 1 capsule by mouth daily.    [provider]  Respiratory Therapy Supplies (NEBULIZER AIR TUBE/PLUGS) MISC 1 each by Does not apply route daily. 01/06/20   Steele Sizer, MD    Family History Family History  Problem Relation Age of Onset   Heart disease Mother    Heart disease Father    Asthma Daughter    Asthma Maternal Grandfather    Heart disease Sister        smoker   Heart attack Sister 65   Kidney cancer Sister    Heart disease Sister        smoker   Heart disease Sister        smoker   Stomach cancer Maternal Grandmother    Colon cancer Neg Hx    Esophageal cancer Neg Hx    Rectal cancer Neg Hx    Colon polyps Neg Hx       Social History Social History   Tobacco Use   Smoking status: Never Smoker   Smokeless tobacco: Never Used   Tobacco comment: smoking cessation materials not required  Vaping Use   Vaping Use: Never used  Substance Use Topics   Alcohol use: No    Alcohol/week: 0.0 standard drinks   Drug use: No     Allergies   Dilaudid [hydromorphone hcl], Naproxen, Nsaids, Percocet [oxycodone-acetaminophen], Latex, Other, Rosuvastatin, and Morphine and related   Review of Systems Review of Systems   Physical Exam Triage Vital Signs ED Triage Vitals  Enc Vitals Group     BP 10/24/20 0954 (!) 144/76     Pulse Rate 10/24/20 0954 70     Resp 10/24/20 0954 17     Temp 10/24/20 0954 98.2 F (36.8 C)     Temp Source 10/24/20 0954 Oral     SpO2 10/24/20 0954 100 %     Weight --      Height --      Head Circumference --      Peak Flow --      Pain Score 10/24/20 0952 6     Pain Loc --      Pain Edu? --      Excl. in [place]? --    No data found.  Updated Vital Signs BP (!) 144/76 (BP Location: Right Arm)    Pulse 70    Temp 98.2 F (36.8 C) (Oral)    Resp 17    SpO2 100%   Visual Acuity Right Eye Distance:   Left Eye Distance:   Bilateral Distance:    Right Eye Near:   Left Eye Near:    Bilateral Near:     Physical Exam Vitals and nursing note reviewed.  Constitutional:      General: She is not in acute distress.    Appearance: Normal appearance. She is not ill-appearing, toxic-appearing or diaphoretic.  HENT:     Head: Normocephalic.     Nose: Nose normal.     Mouth/Throat:     Pharynx: Oropharynx is clear.  Eyes:     Conjunctiva/sclera: Conjunctivae normal.  Cardiovascular:     Rate and Rhythm: Normal rate and regular rhythm.  Pulmonary:     Effort: Pulmonary effort is normal.     Breath sounds: Normal breath sounds.  Musculoskeletal:        General: Normal range of motion.     Cervical back: Normal range of motion.  Skin:    General: Skin is warm and  dry.     Findings: No rash.  Neurological:     Mental Status: She is alert.  Psychiatric:        Mood and Affect: Mood normal.      UC Treatments / Results  Labs (all labs ordered are listed, but only abnormal results are displayed) Labs Reviewed  RESP PANEL BY RT PCR (RSV, FLU A&B, COVID)    EKG   Radiology DG Chest 2 View  Result Date: 10/24/2020 CLINICAL DATA:  Shortness of breath and cough EXAM: CHEST - 2 VIEW COMPARISON:  July 10, 2017 FINDINGS: There is no edema or airspace opacity. Heart size and pulmonary vascularity are normal. Patient is status post median sternotomy. No adenopathy. There are foci of degenerative change in the thoracic spine. IMPRESSION: Lungs clear. Heart size within normal limits. Status post median sternotomy. Electronically Signed   By: Lowella Grip III M.D.   On: 10/24/2020 10:31    Procedures Procedures (including critical care time)  Medications Ordered in UC Medications - No data to display  Initial Impression / Assessment and Plan / UC Course  I have reviewed the triage vital signs and the nursing notes.  Pertinent labs & imaging results that were available during my care of the patient were reviewed by me and considered in my medical decision making (see chart for details).     Viral illness Exam benign Chest x-ray normal Most likely some the viral. Respiratory swab sent for testing. Recommended rest, hydrate and recommend allergy medication Follow up  as needed for continued or worsening symptoms  Final Clinical Impressions(s) / UC Diagnoses   Final diagnoses:  Viral illness     Discharge Instructions     Your x ray was normal I believe this is something viral We are checking you for covid  Rest, Hydrate Follow up as needed for continued or worsening symptoms     ED Prescriptions    None     PDMP not reviewed this encounter.   Orvan July, NP 10/25/20 [phone]

## 2020-11-17 ENCOUNTER — Ambulatory Visit: Payer: Medicare Other | Admitting: Family Medicine

## 2020-12-06 ENCOUNTER — Other Ambulatory Visit: Payer: Self-pay

## 2020-12-06 DIAGNOSIS — I6523 Occlusion and stenosis of bilateral carotid arteries: Secondary | ICD-10-CM

## 2020-12-20 ENCOUNTER — Encounter: Payer: Self-pay | Admitting: Family Medicine

## 2020-12-21 ENCOUNTER — Telehealth: Payer: Self-pay | Admitting: Family Medicine

## 2020-12-21 ENCOUNTER — Other Ambulatory Visit: Payer: Self-pay | Admitting: Family Medicine

## 2020-12-21 ENCOUNTER — Encounter: Payer: Self-pay | Admitting: Family Medicine

## 2020-12-21 DIAGNOSIS — E1169 Type 2 diabetes mellitus with other specified complication: Secondary | ICD-10-CM

## 2020-12-21 DIAGNOSIS — U071 COVID-19: Secondary | ICD-10-CM

## 2020-12-21 DIAGNOSIS — I6523 Occlusion and stenosis of bilateral carotid arteries: Secondary | ICD-10-CM

## 2020-12-21 DIAGNOSIS — J4520 Mild intermittent asthma, uncomplicated: Secondary | ICD-10-CM

## 2020-12-21 DIAGNOSIS — I10 Essential (primary) hypertension: Secondary | ICD-10-CM

## 2020-12-22 ENCOUNTER — Encounter (HOSPITAL_COMMUNITY): Payer: Medicare Other

## 2020-12-22 ENCOUNTER — Ambulatory Visit: Payer: Medicare Other | Admitting: Vascular Surgery

## 2020-12-22 ENCOUNTER — Other Ambulatory Visit: Payer: Self-pay | Admitting: Family Medicine

## 2020-12-22 DIAGNOSIS — I10 Essential (primary) hypertension: Secondary | ICD-10-CM

## 2020-12-22 NOTE — Telephone Encounter (Signed)
appt scheduled for 2.7.2022 pt aware of prescription being called in

## 2020-12-25 ENCOUNTER — Institutional Professional Consult (permissible substitution): Payer: Medicare Other | Admitting: Internal Medicine

## 2020-12-25 ENCOUNTER — Ambulatory Visit: Payer: Medicare Other | Admitting: Cardiovascular Disease

## 2020-12-25 ENCOUNTER — Institutional Professional Consult (permissible substitution): Payer: Medicare Other | Admitting: Pulmonary Disease

## 2020-12-28 ENCOUNTER — Other Ambulatory Visit: Payer: Self-pay

## 2020-12-28 ENCOUNTER — Telehealth: Payer: Self-pay

## 2020-12-28 ENCOUNTER — Telehealth (INDEPENDENT_AMBULATORY_CARE_PROVIDER_SITE_OTHER): Payer: Medicare Other | Admitting: Family Medicine

## 2020-12-28 ENCOUNTER — Encounter: Payer: Self-pay | Admitting: Family Medicine

## 2020-12-28 VITALS — Temp 98.0°F

## 2020-12-28 DIAGNOSIS — U071 COVID-19: Secondary | ICD-10-CM

## 2020-12-28 DIAGNOSIS — I6523 Occlusion and stenosis of bilateral carotid arteries: Secondary | ICD-10-CM

## 2020-12-28 DIAGNOSIS — J4521 Mild intermittent asthma with (acute) exacerbation: Secondary | ICD-10-CM

## 2020-12-28 MED ORDER — BENZONATATE 100 MG PO CAPS: 100.0000 mg | ORAL_CAPSULE | Freq: Two times a day (BID) | ORAL | 0 refills | Status: DC | PRN

## 2020-12-28 MED ORDER — PREDNISONE 10 MG PO TABS: 10.0000 mg | ORAL_TABLET | Freq: Every day | ORAL | 0 refills | Status: DC

## 2020-12-28 NOTE — Progress Notes (Signed)
Name: Bailey [name]   MRN: [id number]    DOB: 1946-09-25   Date:12/28/2020       Progress Note  Subjective  Chief Complaint  Chief Complaint  Patient presents with  . Covid Positive    Tested positive on 1/12. Still having SOB, fatigue, lost of appetite, nausea, cough, headache    I connected with  Bailey [name]  on 12/28/20 at  3:40 PM EST by a video enabled telemedicine application and verified that I am speaking with the correct person using two identifiers.  I discussed the limitations of evaluation and management by telemedicine and the availability of in person appointments. The patient expressed understanding and agreed to proceed with the virtual visit  Staff also discussed with the patient that there may be a patient responsible charge related to this service. Patient Location: at home  Provider Location: [facility]  Additional Individuals present: alone   HPI  COVID-19: Bailey [name] has a history of asthma, she was exposed by her grandchildren to COVID-19, her symptoms started on Tuesday the 11 th, tested positive on the 12 th, on the 13 th we placed an order for infusion, however after filling the requisition I did not sign it. Patient sent [place] a message by mychart and was advised to hold on for a couple of days, but unfortunately it never went through. She states cough initially very productive and yellow in color, she slept most of the time, ate very little due to lack of appetite. She still has fatigue, cough and sob, now sputum is clear, still fatigued and sob with activity, slightly better. Using neb treatments.   Patient Active Problem List   Diagnosis Date Noted  . Dyslipidemia associated with type 2 diabetes mellitus ([place]) 05/17/2020  . Recurrent pancreatitis 05/26/2019  . Left carotid artery stenosis 08/05/2018  . OSA (obstructive sleep apnea) 01/20/2017  . Carotid atherosclerosis, bilateral 01/20/2017  . Abnormal brain MRI 08/26/2016  . Inflammatory polyarthritis ([place])  11/12/2015  . Erosive osteoarthritis of hand 11/12/2015  . History of vitamin D deficiency 11/06/2015  . Hearing loss 08/24/2015  . Asthma, mild intermittent 08/23/2015  . Arthritis, degenerative 08/23/2015  . Dyslipidemia 08/23/2015  . H/O: hypothyroidism 08/23/2015  . Obesity (BMI 30-39.9) 08/23/2015  . HZV (herpes zoster virus) post herpetic neuralgia 08/23/2015  . History of renal stone 04/01/2014  . Diabetes mellitus, controlled ([place]) 04/01/2014  . Essential hypertension, benign 04/01/2014  . Aortic stenosis 06/16/2012  . H/O prosthetic heart valve 06/15/2012    Past Surgical History:  Procedure Laterality Date  . AORTIC VALVE REPLACEMENT  06/2012   [facility]  . APPENDECTOMY  1958  . CHOLECYSTECTOMY  1978  . COLONOSCOPY  05-09-2014  . COLONOSCOPY    . CYSTOSCOPY/RETROGRADE/URETEROSCOPY Right 07/04/2014   Procedure: CYSTOSCOPY/RETROGRADE/URETEROSCOPY;  Surgeon: Bernestine Amass, MD;  Location: [facility];  Service: Urology;  Laterality: Right;  . ENDARTERECTOMY Left 08/05/2018   Procedure: ENDARTERECTOMY CAROTID LEFT;  Surgeon: Waynetta Sandy, MD;  Location: [place];  Service: Vascular;  Laterality: Left;  . ESOPHAGOGASTRODUODENOSCOPY    . EXTRACORPOREAL SHOCK WAVE LITHOTRIPSY Right 06/ 2015     [place]  . HOLMIUM LASER APPLICATION Right 06/06/4764   Procedure: HOLMIUM LASER APPLICATION;  Surgeon: Bernestine Amass, MD;  Location[facility];  Service: Urology;  Laterality: Right;  . PATCH ANGIOPLASTY Left 08/05/2018   Procedure: PATCH ANGIOPLASTY USING XENOSURE BIOLOGIC PATCH 1cm x 6cm;  Surgeon: Waynetta Sandy, MD;  Location: [place];  Service: Vascular;  Laterality: Left;  . POLYPECTOMY    . TONSILLECTOMY  1953  . UPPER GASTROINTESTINAL ENDOSCOPY      Family History  Problem Relation Age of Onset  . Heart disease Mother   . Heart disease Father   . Asthma Daughter   . Asthma Maternal Grandfather   . Heart disease Sister         smoker  . Heart attack Sister 41  . Kidney cancer Sister   . Heart disease Sister        smoker  . Heart disease Sister        smoker  . Stomach cancer Maternal Grandmother   . Colon cancer Neg Hx   . Esophageal cancer Neg Hx   . Rectal cancer Neg Hx   . Colon polyps Neg Hx       Current Outpatient Medications:  .  albuterol (PROAIR HFA) 108 (90 Base) MCG/ACT inhaler, Inhale 2 puffs into the lungs every 4 (four) hours as needed for shortness of breath., Disp: 1 Inhaler, Rfl: 2 .  Ascorbic Acid (VITAMIN C) 100 MG tablet, Take 100 mg by mouth daily., Disp: , Rfl:  .  aspirin EC 81 MG tablet, Take 81 mg by mouth daily., Disp: , Rfl:  .  B Complex CAPS, Take 1 tablet by mouth 2 (two) times daily., Disp: , Rfl:  .  budesonide (PULMICORT) 0.5 MG/2ML nebulizer solution, Take 2 mLs (0.5 mg total) by nebulization as needed., Disp: 120 mL, Rfl: 1 .  Cholecalciferol (VITAMIN D) 2000 UNITS tablet, Take 2,000 Units by mouth every other day. , Disp: , Rfl:  .  Coenzyme Q10 (COQ10) 150 MG CAPS, Take 150 mg by mouth 2 (two) times daily., Disp: , Rfl:  .  diclofenac sodium (VOLTAREN) 1 % GEL, Apply 2 g topically 4 (four) times daily., Disp: 1 Tube, Rfl: 0 .  enalapril (VASOTEC) 5 MG tablet, TAKE 1 TABLET(5 MG) BY MOUTH DAILY, Disp: 30 tablet, Rfl: 0 .  Krill Oil 1000 MG CAPS, Take 1 capsule by mouth daily., Disp: , Rfl:  .  MAG THREONATE-NIACINAMIDE ER PO, Take 800 mg by mouth 2 (two) times daily., Disp: , Rfl:  .  metFORMIN (GLUCOPHAGE) 500 MG tablet, Take 1 tablet (500 mg total) by mouth 2 (two) times daily with a meal., Disp: 180 tablet, Rfl: 0 .  Moringa 500 MG CAPS, Take 1 capsule by mouth daily., Disp: 30 capsule, Rfl: 0 .  predniSONE (DELTASONE) 10 MG tablet, Take 1 tablet (10 mg total) by mouth daily with breakfast., Disp: 5 tablet, Rfl: 0 .  Probiotic Product (PROBIOTIC DAILY PO), Take 1 capsule by mouth daily., Disp: , Rfl:  .  Respiratory Therapy Supplies (NEBULIZER AIR TUBE/PLUGS) MISC, 1  each by Does not apply route daily. (Patient not taking: Reported on 12/28/2020), Disp: 1 each, Rfl: 2  Allergies  Allergen Reactions  . Dilaudid [Hydromorphone Hcl] Other (See Comments)    Severe hypotension - pt states that if she is given this medication it will kill her  . Naproxen Shortness Of Breath  . Nsaids Shortness Of Breath and Other (See Comments)    Causes asthma  . Percocet [Oxycodone-Acetaminophen] Shortness Of Breath    Can tolerate oxy IR  . Sulfa Antibiotics Hives, Rash and Shortness Of Breath  . Latex Hives  . Other Swelling    Eye drop preservative Specific agent unspecified  . Rosuvastatin Other (See Comments)    myalgia  . Morphine And Related  Pain with IM injection    I personally reviewed active problem list, medication list, allergies, family history, social history, health maintenance with the patient/caregiver today.   ROS  Ten systems reviewed and is negative except as mentioned in HPI  Objective  Virtual encounter, vitals not obtained.  There is no height or weight on file to calculate BMI.  Physical Exam  Awake, alert and oriented   PHQ2/9: Depression screen [facility] 2/9 12/28/2020 05/17/2020 01/25/2020 01/06/2020 11/17/2019  Decreased Interest 0 0 0 0 0  Down, Depressed, Hopeless 0 0 0 0 0  PHQ - 2 Score 0 0 0 0 0  Altered sleeping - 0 - 0 0  Tired, decreased energy - 0 - 0 0  Change in appetite - 0 - 0 0  Feeling bad or failure about yourself  - 0 - 0 0  Trouble concentrating - 0 - 0 0  Moving slowly or fidgety/restless - 0 - 0 0  Suicidal thoughts - 0 - 0 0  PHQ-9 Score - 0 - 0 0  Difficult doing work/chores - - - - -  Some recent data might be hidden   PHQ-2/9 Result is negative.    Fall Risk: Fall Risk  12/28/2020 05/17/2020 01/25/2020 01/06/2020 11/17/2019  Falls in the past year? 0 0 0 0 0  Comment - - - - -  Number falls in past yr: 0 - 0 0 0  Injury with Fall? 0 - 0 0 0  Risk for fall due to : - - No Fall Risks - -  Risk for  fall due to: Comment - - - - -  Follow up Falls evaluation completed - Falls prevention discussed - -     Assessment & Plan  1. COVID-19  - predniSONE (DELTASONE) 10 MG tablet; Take 1 tablet (10 mg total) by mouth daily with breakfast.  Dispense: 5 tablet; Refill: 0  2. Mild intermittent asthma with exacerbation  - predniSONE (DELTASONE) 10 MG tablet; Take 1 tablet (10 mg total) by mouth daily with breakfast.  Dispense: 5 tablet; Refill: 0  I discussed the assessment and treatment plan with the patient. The patient was provided an opportunity to ask questions and all were answered. The patient agreed with the plan and demonstrated an understanding of the instructions.  The patient was advised to call back or seek an in-person evaluation if the symptoms worsen or if the condition fails to improve as anticipated.  I provided 15  minutes of non-face-to-face time during this encounter.

## 2020-12-28 NOTE — Addendum Note (Signed)
Addended by: Steele Sizer F on: 12/28/2020 05:59 PM   Modules accepted: Orders

## 2020-12-28 NOTE — Telephone Encounter (Signed)
Message sent to [facility] to request virtual visit.

## 2021-01-10 ENCOUNTER — Ambulatory Visit: Payer: Medicare Other | Admitting: Family Medicine

## 2021-01-12 NOTE — Progress Notes (Signed)
Name: Bailey [name]   MRN: [id number]    DOB: Oct 30, 1946   Date:01/15/2021       Progress Note  Subjective  Chief Complaint  Medication Refill  HPI  DMII: A1C today is normal at 5.3 % .She has not been checking her glucose at home but denies polyphagia, polydipsia or polyuria.She has been physically active, but not as much since she had COVID and has sob with activity .On Ace for kidney protection, for  Dyslipidemia  she only believes in fish oil, she has  intolerance to statins .Discussed PKS9 and also Ezetimide in the past    COVID-19 history / asthma: she had COVID-19 in January and still has sob with activity,pulse ox is back to normal, she continues to have a cough, feels like chest is congested and cough is productive and white. She has been using neb therapy once a day, advised to go up to twice a day but she does not have time before going to work   HTN: taking medication and denies side effects of medication, bp today is towards low range of normal, we will decrease dose to 2.5 mg daily  Overweight : sheis physically active,she has lost 15 lbs since 01 /2020 - but has been stable for the past 4 months. Down from 101 12/2018  It was around  at 152 lbs  Today it is up to 165lbs , she states since COVID she has not been as active   Dyslipidemia: unable to tolerate statin therapy, she is taking fish oil only. She is aware of importance of medication, she refuses Zetia of PKS9 injection.Unchanged   History of carotid endarterectomy left side: recent visit with vascular surgeon showed carotid artery duplex which demonstrates 40 to 59% stenosis on the right with peak systolic velocity 96. On the left peak systolic velocity 96 and 40 to 59% stenosis as well at the site of the endarterectomy.  Hearing loss: seen by ENT, needs hearing aid but can't afford it at this time Unchanged   Inflammatory arthritis :she was seeing Rheumatologist and was taking Plaquenil but she  stopped taking it on her own. She states her aches and pain are stable, mostly on her hands and wrists. She had [place] of her wrist and it was very expensive .She is doing well with moringa capsules. No longer having generalized joint aches - no longer has PMR, shoulder girdle and hips are better     Patient Active Problem List   Diagnosis Date Noted  . Dyslipidemia associated with type 2 diabetes mellitus ([place]) 05/17/2020  . Recurrent pancreatitis 05/26/2019  . Left carotid artery stenosis 08/05/2018  . OSA (obstructive sleep apnea) 01/20/2017  . Carotid atherosclerosis, bilateral 01/20/2017  . Abnormal brain MRI 08/26/2016  . Inflammatory polyarthritis ([place]) 11/12/2015  . Erosive osteoarthritis of hand 11/12/2015  . History of vitamin D deficiency 11/06/2015  . Hearing loss 08/24/2015  . Asthma, mild intermittent 08/23/2015  . Arthritis, degenerative 08/23/2015  . Dyslipidemia 08/23/2015  . H/O: hypothyroidism 08/23/2015  . Obesity (BMI 30-39.9) 08/23/2015  . HZV (herpes zoster virus) post herpetic neuralgia 08/23/2015  . History of renal stone 04/01/2014  . Diabetes mellitus, controlled ([place]) 04/01/2014  . Essential hypertension, benign 04/01/2014  . Aortic stenosis 06/16/2012  . H/O prosthetic heart valve 06/15/2012    Past Surgical History:  Procedure Laterality Date  . AORTIC VALVE REPLACEMENT  06/2012   [facility]  . APPENDECTOMY  1958  . CHOLECYSTECTOMY  1978  . COLONOSCOPY  05-09-2014  .  COLONOSCOPY    . CYSTOSCOPY/RETROGRADE/URETEROSCOPY Right 07/04/2014   Procedure: CYSTOSCOPY/RETROGRADE/URETEROSCOPY;  Surgeon: Bernestine Amass, MD;  Location: [facility];  Service: Urology;  Laterality: Right;  . ENDARTERECTOMY Left 08/05/2018   Procedure: ENDARTERECTOMY CAROTID LEFT;  Surgeon: Waynetta Sandy, MD;  Location: [facility];  Service: Vascular;  Laterality: Left;  . ESOPHAGOGASTRODUODENOSCOPY    . EXTRACORPOREAL SHOCK WAVE LITHOTRIPSY Right 06/ 2015      [place]  . HOLMIUM LASER APPLICATION Right 12/09/7508   Procedure: HOLMIUM LASER APPLICATION;  Surgeon: Bernestine Amass, MD;  Location: [facility];  Service: Urology;  Laterality: Right;  . PATCH ANGIOPLASTY Left 08/05/2018   Procedure: PATCH ANGIOPLASTY USING XENOSURE BIOLOGIC PATCH 1cm x 6cm;  Surgeon: Waynetta Sandy, MD;  Location: [facility];  Service: Vascular;  Laterality: Left;  . POLYPECTOMY    . TONSILLECTOMY  1953  . UPPER GASTROINTESTINAL ENDOSCOPY      Family History  Problem Relation Age of Onset  . Heart disease Mother   . Heart disease Father   . Asthma Daughter   . Asthma Maternal Grandfather   . Heart disease Sister        smoker  . Heart attack Sister 87  . Kidney cancer Sister   . Heart disease Sister        smoker  . Heart disease Sister        smoker  . Stomach cancer Maternal Grandmother   . Colon cancer Neg Hx   . Esophageal cancer Neg Hx   . Rectal cancer Neg Hx   . Colon polyps Neg Hx     Social History   Tobacco Use  . Smoking status: Never Smoker  . Smokeless tobacco: Never Used  . Tobacco comment: smoking cessation materials not required  Substance Use Topics  . Alcohol use: No    Alcohol/week: 0.0 standard drinks     Current Outpatient Medications:  .  albuterol (PROAIR HFA) 108 (90 Base) MCG/ACT inhaler, Inhale 2 puffs into the lungs every 4 (four) hours as needed for shortness of breath., Disp: 1 Inhaler, Rfl: 2 .  Ascorbic Acid (VITAMIN C) 100 MG tablet, Take 100 mg by mouth daily., Disp: , Rfl:  .  aspirin EC 81 MG tablet, Take 81 mg by mouth daily., Disp: , Rfl:  .  B Complex CAPS, Take 1 tablet by mouth 2 (two) times daily., Disp: , Rfl:  .  budesonide (PULMICORT) 0.5 MG/2ML nebulizer solution, Take 2 mLs (0.5 mg total) by nebulization as needed., Disp: 120 mL, Rfl: 1 .  Cholecalciferol (VITAMIN D) 2000 UNITS tablet, Take 2,000 Units by mouth every other day. , Disp: , Rfl:  .  Coenzyme Q10 (COQ10) 150 MG CAPS,  Take 150 mg by mouth 2 (two) times daily., Disp: , Rfl:  .  diclofenac sodium (VOLTAREN) 1 % GEL, Apply 2 g topically 4 (four) times daily., Disp: 1 Tube, Rfl: 0 .  enalapril (VASOTEC) 5 MG tablet, TAKE 1 TABLET(5 MG) BY MOUTH DAILY, Disp: 30 tablet, Rfl: 0 .  Krill Oil 1000 MG CAPS, Take 1 capsule by mouth daily., Disp: , Rfl:  .  MAG THREONATE-NIACINAMIDE ER PO, Take 800 mg by mouth 2 (two) times daily., Disp: , Rfl:  .  metFORMIN (GLUCOPHAGE) 500 MG tablet, Take 1 tablet (500 mg total) by mouth 2 (two) times daily with a meal., Disp: 180 tablet, Rfl: 0 .  Moringa 500 MG CAPS, Take 1 capsule by mouth daily., Disp:  30 capsule, Rfl: 0 .  Probiotic Product (PROBIOTIC DAILY PO), Take 1 capsule by mouth daily., Disp: , Rfl:  .  Respiratory Therapy Supplies (NEBULIZER AIR TUBE/PLUGS) MISC, 1 each by Does not apply route daily., Disp: 1 each, Rfl: 2 .  benzonatate (TESSALON) 100 MG capsule, Take 1-2 capsules (100-200 mg total) by mouth 2 (two) times daily as needed. (Patient not taking: Reported on 01/15/2021), Disp: 40 capsule, Rfl: 0 .  predniSONE (DELTASONE) 10 MG tablet, Take 1 tablet (10 mg total) by mouth daily with breakfast. (Patient not taking: Reported on 01/15/2021), Disp: 5 tablet, Rfl: 0  Allergies  Allergen Reactions  . Dilaudid [Hydromorphone Hcl] Other (See Comments)    Severe hypotension - pt states that if she is given this medication it will kill her  . Naproxen Shortness Of Breath  . Nsaids Shortness Of Breath and Other (See Comments)    Causes asthma  . Percocet [Oxycodone-Acetaminophen] Shortness Of Breath    Can tolerate oxy IR  . Sulfa Antibiotics Hives, Rash and Shortness Of Breath  . Latex Hives  . Other Swelling    Eye drop preservative Specific agent unspecified  . Rosuvastatin Other (See Comments)    myalgia  . Morphine And Related     Pain with IM injection    I personally reviewed active problem list, medication list, allergies, family history, social history,  health maintenance, notes from last encounter with the patient/caregiver today.   ROS  Constitutional: Negative for fever, positive for weight change.  Respiratory: positive for cough and shortness of breath.   Cardiovascular: Negative for chest pain or palpitations.  Gastrointestinal: Negative for abdominal pain, no bowel changes.  Musculoskeletal: Negative for gait problem or joint swelling.  Skin: Negative for rash.  Neurological: Negative for dizziness or headache.  No other specific complaints in a complete review of systems (except as listed in HPI above).  Objective  Vitals:   01/15/21 1440  BP: 112/72  Pulse: 89  Resp: 18  Temp: 97.7 F (36.5 C)  TempSrc: Oral  SpO2: 98%  Weight: 165 lb 3.2 oz (74.9 kg)  Height: 5\' 2"  (1.575 m)    Body mass index is 30.22 kg/m.  Physical Exam  Constitutional: Patient appears well-developed and well-nourished. Obese  No distress.  HEENT: head atraumatic, normocephalic, pupils equal and reactive to light, neck supple Cardiovascular: Normal rate, regular rhythm and normal heart sounds.  3/6  holosystolic  murmur heard. Trace  BLE edema. Pulmonary/Chest: Effort normal and breath sounds normal. No respiratory distress. Abdominal: Soft.  There is no tenderness. Muscular skeletal: both hands unable to make a fist . No erythema or increase in warmth  Psychiatric: Patient has a normal mood and affect. behavior is normal. Judgment and thought content normal.  Recent Results (from the past 2160 hour(s))  Resp Panel by RT PCR (RSV, Flu A&B, Covid) - Nasopharyngeal Swab     Status: None   Collection Time: 10/24/20 10:37 AM   Specimen: Nasopharyngeal Swab  Result Value Ref Range   SARS Coronavirus 2 by RT PCR NEGATIVE NEGATIVE    Comment: (NOTE) SARS-CoV-2 target nucleic acids are NOT DETECTED.  The SARS-CoV-2 RNA is generally detectable in upper respiratoy specimens during the acute phase of infection. The lowest concentration of  SARS-CoV-2 viral copies this assay can detect is 131 copies/mL. A negative result does not preclude SARS-Cov-2 infection and should not be used as the sole basis for treatment or other patient management decisions. A negative result may  occur with  improper specimen collection/handling, submission of specimen other than nasopharyngeal swab, presence of viral mutation(s) within the areas targeted by this assay, and inadequate number of viral copies (<131 copies/mL). A negative result must be combined with clinical observations, patient history, and epidemiological information. The expected result is Negative.  Fact Sheet for Patients:  https://www.moore.com/https://www.fda.gov/media/142436/download  Fact Sheet for Healthcare Providers:  https://www.young.biz/https://www.fda.gov/media/142435/download  This test is no t yet approved or cleared by the [place] FDA and  has been authorized for detection and/or diagnosis of SARS-CoV-2 by FDA under an Emergency Use Authorization (EUA). This EUA will remain  in effect (meaning this test can be used) for the duration of the COVID-19 declaration under Section 564(b)(1) of the Act, 21 U.S.C. section 360bbb-3(b)(1), unless the authorization is terminated or revoked sooner.     Influenza A by PCR NEGATIVE NEGATIVE   Influenza B by PCR NEGATIVE NEGATIVE    Comment: (NOTE) The Xpert Xpress SARS-CoV-2/FLU/RSV assay is intended as an aid in  the diagnosis of influenza from Nasopharyngeal swab specimens and  should not be used as a sole basis for treatment. Nasal washings and  aspirates are unacceptable for Xpert Xpress SARS-CoV-2/FLU/RSV  testing.  Fact Sheet for Patients: https://www.moore.com/https://www.fda.gov/media/142436/download  Fact Sheet for Healthcare Providers: https://www.young.biz/https://www.fda.gov/media/142435/download  This test is not yet approved or cleared by the [place] FDA and  has been authorized for detection and/or diagnosis of SARS-CoV-2 by  FDA under an Emergency Use Authorization (EUA).  This EUA will remain  in effect (meaning this test can be used) for the duration of the  Covid-19 declaration under Section 564(b)(1) of the Act, 21  U.S.C. section 360bbb-3(b)(1), unless the authorization is  terminated or revoked.    Respiratory Syncytial Virus by PCR NEGATIVE NEGATIVE    Comment: (NOTE) Fact Sheet for Patients: https://www.moore.com/https://www.fda.gov/media/142436/download  Fact Sheet for Healthcare Providers: https://www.young.biz/https://www.fda.gov/media/142435/download  This test is not yet approved or cleared by the [place] FDA and  has been authorized for detection and/or diagnosis of SARS-CoV-2 by  FDA under an Emergency Use Authorization (EUA). This EUA will remain  in effect (meaning this test can be used) for the duration of the  COVID-19 declaration under Section 564(b)(1) of the Act, 21 U.S.C.  section 360bbb-3(b)(1), unless the authorization is terminated or  revoked. Performed at [facility], [address] [place], [place] [address]   POCT HgB A1C     Status: Normal   Collection Time: 01/15/21  2:53 PM  Result Value Ref Range   Hemoglobin A1C 5.3 4.0 - 5.6 %   HbA1c POC (<> result, manual entry)     HbA1c, POC (prediabetic range)     HbA1c, POC (controlled diabetic range)        PHQ2/9: [facility] screen [facility] 01/15/2021 12/28/2020 05/17/2020 01/25/2020 01/06/2020  Decreased Interest 0 0 0 0 0  Down, Depressed, Hopeless 0 0 0 0 0  PHQ - 2 Score 0 0 0 0 0  Altered sleeping - - 0 - 0  Tired, decreased energy - - 0 - 0  Change in appetite - - 0 - 0  Feeling bad or failure about yourself  - - 0 - 0  Trouble concentrating - - 0 - 0  Moving slowly or fidgety/restless - - 0 - 0  Suicidal thoughts - - 0 - 0  PHQ-9 Score - - 0 - 0  Difficult doing work/chores - - - - -  Some recent data might be hidden  phq 9 is negative   Fall Risk: Fall Risk  01/15/2021 12/28/2020 05/17/2020 01/25/2020 01/06/2020  Falls in the past year? 0 0 0 0 0  Comment - - - - -  Number falls in  past yr: 0 0 - 0 0  Injury with Fall? 0 0 - 0 0  Risk for fall due to : - - - No Fall Risks -  Risk for fall due to: Comment - - - - -  Follow up - Falls evaluation completed - Falls prevention discussed -      Functional Status Survey: Is the patient deaf or have difficulty hearing?: Yes (wears hearing aids) Does the patient have difficulty seeing, even when wearing glasses/contacts?: No Does the patient have difficulty concentrating, remembering, or making decisions?: No Does the patient have difficulty walking or climbing stairs?: No Does the patient have difficulty dressing or bathing?: No Does the patient have difficulty doing errands alone such as visiting a doctor's office or shopping?: No   Assessment & Plan  1. Dyslipidemia associated with type 2 diabetes mellitus (HCC)  - POCT HgB A1C - Lipid panel - Microalbumin / creatinine urine ratio - COMPLETE METABOLIC PANEL WITH GFR  2. Mild intermittent asthma without complication  - budesonide (PULMICORT) 0.5 MG/2ML nebulizer solution; Take 2 mLs (0.5 mg total) by nebulization as needed.  Dispense: 120 mL; Refill: 1 - albuterol (VENTOLIN HFA) 108 (90 Base) MCG/ACT inhaler; Inhale 2 puffs into the lungs every 6 (six) hours as needed for wheezing or shortness of breath.  Dispense: 18 g; Refill: 0 - albuterol (PROVENTIL) (2.5 MG/3ML) 0.083% nebulizer solution; Take 3 mLs (2.5 mg total) by nebulization every 6 (six) hours as needed for wheezing or shortness of breath.  Dispense: 150 mL; Refill: 1  3. Carotid atherosclerosis, bilateral   4. Hypertension, benign  - CBC with Differential/Platelet - COMPLETE METABOLIC PANEL WITH GFR - enalapril (VASOTEC) 2.5 MG tablet; Take 1 tablet (2.5 mg total) by mouth daily.  Dispense: 90 tablet; Refill: 1  5. OSA (obstructive sleep apnea)  - CBC with Differential/Platelet  6. Senile purpura (HCC)  Stable  7. Diabetes mellitus type 2 in obese ([place])   8. Long-term use of high-risk  medication  - Vitamin B12  9. Inflammatory polyarthritis ([place])   10. Post-COVID chronic cough   11. Myalgia due to statin   12. Bilateral hearing loss, unspecified hearing loss type  - AMB Referral to [place]

## 2021-01-15 ENCOUNTER — Other Ambulatory Visit: Payer: Self-pay

## 2021-01-15 ENCOUNTER — Encounter: Payer: Self-pay | Admitting: Family Medicine

## 2021-01-15 ENCOUNTER — Ambulatory Visit (INDEPENDENT_AMBULATORY_CARE_PROVIDER_SITE_OTHER): Payer: Medicare Other | Admitting: Family Medicine

## 2021-01-15 ENCOUNTER — Other Ambulatory Visit: Payer: Self-pay | Admitting: Family Medicine

## 2021-01-15 VITALS — BP 112/72 | HR 89 | Temp 97.7°F | Resp 18 | Ht 62.0 in | Wt 165.2 lb

## 2021-01-15 DIAGNOSIS — T466X5A Adverse effect of antihyperlipidemic and antiarteriosclerotic drugs, initial encounter: Secondary | ICD-10-CM

## 2021-01-15 DIAGNOSIS — I10 Essential (primary) hypertension: Secondary | ICD-10-CM

## 2021-01-15 DIAGNOSIS — M791 Myalgia, unspecified site: Secondary | ICD-10-CM | POA: Diagnosis not present

## 2021-01-15 DIAGNOSIS — U099 Post covid-19 condition, unspecified: Secondary | ICD-10-CM

## 2021-01-15 DIAGNOSIS — J4520 Mild intermittent asthma, uncomplicated: Secondary | ICD-10-CM

## 2021-01-15 DIAGNOSIS — H9193 Unspecified hearing loss, bilateral: Secondary | ICD-10-CM | POA: Diagnosis not present

## 2021-01-15 DIAGNOSIS — D692 Other nonthrombocytopenic purpura: Secondary | ICD-10-CM | POA: Diagnosis not present

## 2021-01-15 DIAGNOSIS — E785 Hyperlipidemia, unspecified: Secondary | ICD-10-CM | POA: Diagnosis not present

## 2021-01-15 DIAGNOSIS — E559 Vitamin D deficiency, unspecified: Secondary | ICD-10-CM | POA: Diagnosis not present

## 2021-01-15 DIAGNOSIS — I6523 Occlusion and stenosis of bilateral carotid arteries: Secondary | ICD-10-CM | POA: Diagnosis not present

## 2021-01-15 DIAGNOSIS — Z79899 Other long term (current) drug therapy: Secondary | ICD-10-CM | POA: Diagnosis not present

## 2021-01-15 DIAGNOSIS — R053 Chronic cough: Secondary | ICD-10-CM | POA: Diagnosis not present

## 2021-01-15 DIAGNOSIS — M064 Inflammatory polyarthropathy: Secondary | ICD-10-CM

## 2021-01-15 DIAGNOSIS — E669 Obesity, unspecified: Secondary | ICD-10-CM

## 2021-01-15 DIAGNOSIS — G4733 Obstructive sleep apnea (adult) (pediatric): Secondary | ICD-10-CM | POA: Diagnosis not present

## 2021-01-15 DIAGNOSIS — E1169 Type 2 diabetes mellitus with other specified complication: Secondary | ICD-10-CM | POA: Diagnosis not present

## 2021-01-15 LAB — POCT GLYCOSYLATED HEMOGLOBIN (HGB A1C): Hemoglobin A1C: 5.3 % (ref 4.0–5.6)

## 2021-01-15 MED ORDER — BUDESONIDE 0.5 MG/2ML IN SUSP: 0.5000 mg | RESPIRATORY_TRACT | 1 refills | Status: DC | PRN

## 2021-01-15 MED ORDER — ALBUTEROL SULFATE HFA 108 (90 BASE) MCG/ACT IN AERS: 2.0000 | INHALATION_SPRAY | Freq: Four times a day (QID) | RESPIRATORY_TRACT | 0 refills | Status: DC | PRN

## 2021-01-15 MED ORDER — ALBUTEROL SULFATE (2.5 MG/3ML) 0.083% IN NEBU: 2.5000 mg | INHALATION_SOLUTION | Freq: Four times a day (QID) | RESPIRATORY_TRACT | 1 refills | Status: DC | PRN

## 2021-01-15 MED ORDER — ENALAPRIL MALEATE 2.5 MG PO TABS: 2.5000 mg | ORAL_TABLET | Freq: Every day | ORAL | 1 refills | Status: DC

## 2021-01-15 NOTE — Telephone Encounter (Signed)
   Notes to clinic:  requesting a 90 day supply Review for change    Requested Prescriptions  Pending Prescriptions Disp Refills   albuterol (VENTOLIN HFA) 108 (90 Base) MCG/ACT inhaler [Pharmacy Med Name: ALBUTEROL HFA INH(200 PUFFS)18GM] 54 g     Sig: INHALE 2 PUFFS INTO THE LUNGS EVERY 6 HOURS AS NEEDED FOR WHEEZING OR SHORTNESS OF BREATH      Pulmonology:  Beta Agonists Failed - 01/15/2021  3:39 PM      Failed - One inhaler should last at least one month. If the patient is requesting refills earlier, contact the patient to check for uncontrolled symptoms.      Passed - Valid encounter within last 12 months    Recent Outpatient Visits           Today Dyslipidemia associated with type 2 diabetes mellitus [facility])   [facility] Steele Sizer, MD   2 weeks ago COVID-19   [facility] [place], Drue Stager, MD   8 months ago Dyslipidemia associated with type 2 diabetes mellitus [facility])   [facility] Steele Sizer, MD   1 year ago Right flank pain   [facility], Drue Stager, MD   1 year ago Dyslipidemia associated with type 2 diabetes mellitus [facility])   [facility] Steele Sizer, MD       Future Appointments             In 2 weeks  [facility], [place]   In 6 months Steele Sizer, MD [facility]

## 2021-01-16 LAB — COMPLETE METABOLIC PANEL WITH GFR
AG Ratio: 1.6 (calc) (ref 1.0–2.5)
ALT: 10 U/L (ref 6–29)
AST: 17 U/L (ref 10–35)
Albumin: 4.1 g/dL (ref 3.6–5.1)
Alkaline phosphatase (APISO): 40 U/L (ref 37–153)
BUN: 23 mg/dL (ref 7–25)
CO2: 29 mmol/L (ref 20–32)
Calcium: 9.7 mg/dL (ref 8.6–10.4)
Chloride: 102 mmol/L (ref 98–110)
Creat: 0.69 mg/dL (ref 0.60–0.93)
GFR, Est African American: 100 mL/min/{1.73_m2} (ref >=60)
GFR, Est Non African American: 86 mL/min/{1.73_m2} (ref >=60)
Globulin: 2.6 g/dL (calc) (ref 1.9–3.7)
Glucose, Bld: 85 mg/dL (ref 65–99)
Potassium: 4.1 mmol/L (ref 3.5–5.3)
Sodium: 140 mmol/L (ref 135–146)
Total Bilirubin: 0.6 mg/dL (ref 0.2–1.2)
Total Protein: 6.7 g/dL (ref 6.1–8.1)

## 2021-01-16 LAB — VITAMIN B12: Vitamin B-12: 440 pg/mL (ref 200–1100)

## 2021-01-16 LAB — LIPID PANEL
Cholesterol: 221 mg/dL — ABNORMAL HIGH (ref <200)
HDL: 84 mg/dL (ref >=50)
LDL Cholesterol (Calc): 119 mg/dL (calc) — ABNORMAL HIGH
Non-HDL Cholesterol (Calc): 137 mg/dL (calc) — ABNORMAL HIGH (ref <130)
Total CHOL/HDL Ratio: 2.6 (calc) (ref <5.0)
Triglycerides: 81 mg/dL (ref <150)

## 2021-01-16 LAB — MICROALBUMIN / CREATININE URINE RATIO
Creatinine, Urine: 95 mg/dL (ref 20–275)
Microalb Creat Ratio: 5 mcg/mg creat (ref <30)
Microalb, Ur: 0.5 mg/dL

## 2021-01-16 LAB — CBC WITH DIFFERENTIAL/PLATELET
Absolute Monocytes: 755 cells/uL (ref 200–950)
Basophils Absolute: 27 cells/uL (ref 0–200)
Basophils Relative: 0.4 %
Eosinophils Absolute: 61 cells/uL (ref 15–500)
Eosinophils Relative: 0.9 %
HCT: 37 % (ref 35.0–45.0)
Hemoglobin: 12.2 g/dL (ref 11.7–15.5)
Lymphs Abs: 1353 cells/uL (ref 850–3900)
MCH: 29.3 pg (ref 27.0–33.0)
MCHC: 33 g/dL (ref 32.0–36.0)
MCV: 88.7 fL (ref 80.0–100.0)
MPV: 10.9 fL (ref 7.5–12.5)
Monocytes Relative: 11.1 %
Neutro Abs: 4604 cells/uL (ref 1500–7800)
Neutrophils Relative %: 67.7 %
Platelets: 212 10*3/uL (ref 140–400)
RBC: 4.17 10*6/uL (ref 3.80–5.10)
RDW: 13.4 % (ref 11.0–15.0)
Total Lymphocyte: 19.9 %
WBC: 6.8 10*3/uL (ref 3.8–10.8)

## 2021-01-16 LAB — VITAMIN D 25 HYDROXY (VIT D DEFICIENCY, FRACTURES): Vit D, 25-Hydroxy: 74 ng/mL (ref 30–100)

## 2021-01-18 DIAGNOSIS — H6123 Impacted cerumen, bilateral: Secondary | ICD-10-CM | POA: Diagnosis not present

## 2021-01-22 ENCOUNTER — Telehealth: Payer: Self-pay

## 2021-01-22 NOTE — Telephone Encounter (Signed)
   Telephone encounter was:  Unsuccessful.  01/22/2021 Name: Bailey [name] MRN: [id number] DOB: 1946/09/13  Unsuccessful outbound call made today to assist with:  Hearing aids  Outreach Attempt:  1st Attempt  A HIPAA compliant voice message was left requesting a return call.  Instructed patient to call back at [phone].  Mariska Daffin, AAS Paralegal, [place] . Embedded Care Coordination [facility]  Care Management  [address], [place] [address] [name]  ?? [phone]   [email or web address]

## 2021-01-25 ENCOUNTER — Ambulatory Visit: Payer: Medicare Other

## 2021-01-29 ENCOUNTER — Other Ambulatory Visit: Payer: Self-pay

## 2021-01-29 ENCOUNTER — Ambulatory Visit (INDEPENDENT_AMBULATORY_CARE_PROVIDER_SITE_OTHER): Payer: Medicare Other | Admitting: Internal Medicine

## 2021-01-29 ENCOUNTER — Encounter: Payer: Self-pay | Admitting: Internal Medicine

## 2021-01-29 ENCOUNTER — Telehealth: Payer: Self-pay

## 2021-01-29 VITALS — BP 120/68 | HR 80 | Temp 97.3°F | Ht 62.0 in | Wt 165.0 lb

## 2021-01-29 DIAGNOSIS — U071 COVID-19: Secondary | ICD-10-CM | POA: Diagnosis not present

## 2021-01-29 DIAGNOSIS — J4520 Mild intermittent asthma, uncomplicated: Secondary | ICD-10-CM

## 2021-01-29 DIAGNOSIS — I6523 Occlusion and stenosis of bilateral carotid arteries: Secondary | ICD-10-CM | POA: Diagnosis not present

## 2021-01-29 MED ORDER — ALBUTEROL SULFATE HFA 108 (90 BASE) MCG/ACT IN AERS
2.0000 | INHALATION_SPRAY | Freq: Four times a day (QID) | RESPIRATORY_TRACT | 3 refills | Status: AC | PRN
Start: 2021-01-29 — End: ?

## 2021-01-29 NOTE — Progress Notes (Addendum)
[name]    [id number]    September 27, 1946  Primary Care Physician:Sowles, Drue Stager, MD  Referring Physician: Steele Sizer, MD [address] [place],  [place] [address] Reason for Consultation:  Date of Consultation: 01/29/2021  Chief complaint:   Chief Complaint  Patient presents with  . Referral    Covid mid January.  Ucc noted something on cxr on lung.  Exercise induced asthma.  Covid cough.  Using nebulizer (budesonide, not helping.  Sob with exertion.  Used to walk 3 miles/day.     HPI: Bailey [name] is a 75 y.o. woman with history of exercised induced asthma.  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 Previously taking albuterol nebulizer with exercise or with high pollen counts. She notes strong family history of asthma.   She was previously walking 3 miles a day. She had covid last month in mid January 2021 and has worsening dyspnea and decreased exercise tolerance. Now can only walk 0.25 miles.  Having trouble staying active with grandchildren.   She notes that albuterol nebulizer helps a lot. She has recently started budesonide nebulizer treatments which has improved her cough symptoms a lot. She still notes frequent throat clearing. Tickle in her chest is improved. Takes it once a day during the week and twice a day on the holidays due to time.   Overall she is feeling better but had an abnormal chest xray in  Nov after a work injury and wants some follow up on that as well.   She is also having leg cramps which are worse than normal and in her thighs.   Current Regimen: budesonide nebs, albuterol nebs Asthma Triggers: exercise, pollen with seasons changing. Exacerbations in the last year: none History of hospitalization or intubation: never Allergy Testing: never GERD: none ACT:  Asthma Control Test ACT Total Score  01/29/2021 19   Social history:  She is  originally from [place], [place] and lived in [place], [place].  Moved down to be with her grandchildren   Social History   Occupational History  . Occupation: Works for [employer]-retired    Employer: [employer]  Tobacco Use  . Smoking status: Never Smoker  . Smokeless tobacco: Never Used  . Tobacco comment: smoking cessation materials not required  Vaping Use  . Vaping Use: Never used  Substance and Sexual Activity  . Alcohol use: No    Alcohol/week: 0.0 standard drinks  . Drug use: No  . Sexual activity: Not Currently    Relevant family history:  Family History  Problem Relation Age of Onset  . Heart disease Mother   . Heart disease Father   . Asthma Daughter   . Asthma Maternal Grandfather   . Heart disease Sister        smoker  . Heart attack Sister 11  . Kidney cancer Sister   . Heart disease Sister        smoker  . Heart disease Sister        smoker  . Stomach cancer Maternal Grandmother   . Colon cancer Neg Hx   . Esophageal cancer Neg Hx   . Rectal cancer Neg Hx   . Colon polyps Neg Hx     Past Medical History:  Diagnosis Date  . Allergy   . Arthritis   . Asthma   . B12 deficiency anemia   . Blood transfusion  without reported diagnosis   . H/O aortic valve replacement 2013   at [facility]   . History of colon polyps   . History of kidney stones   . HTN (hypertension)   . Hyperlipidemia   . Left nephrolithiasis   . OSA (obstructive sleep apnea)    STUDY 12 YRS CPAP RX-- BUT NONTOLERANT  . Right ureteral stone   . S/P aortic valve replacement    2013  AT [facility]--- CARDIOLOGIST--  DR CRAWFORD AT [facility]  . Seasonal allergies   . Sleep apnea    wears cpap   . Statin intolerance   . Type 2 diabetes mellitus ([place])    type 2    Past Surgical History:  Procedure Laterality Date  . AORTIC VALVE REPLACEMENT  06/2012   [facility]  . APPENDECTOMY  1958  . CHOLECYSTECTOMY  1978  . COLONOSCOPY  05-09-2014  . COLONOSCOPY    .  CYSTOSCOPY/RETROGRADE/URETEROSCOPY Right 07/04/2014   Procedure: CYSTOSCOPY/RETROGRADE/URETEROSCOPY;  Surgeon: Bernestine Amass, MD;  [facility];  Service: Urology;  Laterality: Right;  . ENDARTERECTOMY Left 08/05/2018   Procedure: ENDARTERECTOMY CAROTID LEFT;  Surgeon: Waynetta Sandy, MD;  Location: [facility];  Service: Vascular;  Laterality: Left;  . ESOPHAGOGASTRODUODENOSCOPY    . EXTRACORPOREAL SHOCK WAVE LITHOTRIPSY Right 06/ 2015     [facility]  . HOLMIUM LASER APPLICATION Right 07/18/1750   Procedure: HOLMIUM LASER APPLICATION;  Surgeon: Bernestine Amass, MD;  Location[facility];  Service: Urology;  Laterality: Right;  . PATCH ANGIOPLASTY Left 08/05/2018   Procedure: PATCH ANGIOPLASTY USING XENOSURE BIOLOGIC PATCH 1cm x 6cm;  Surgeon: Waynetta Sandy, MD;  Location: [facility];  Service: Vascular;  Laterality: Left;  . POLYPECTOMY    . TONSILLECTOMY  1953  . UPPER GASTROINTESTINAL ENDOSCOPY       Physical Exam: Blood pressure 120/68, pulse 80, temperature (!) 97.3 F (36.3 C), temperature source Temporal, height 5\' 2"  (1.575 m), weight 165 lb (74.8 kg), SpO2 97 %. Gen:      No acute distress, frequent throat clearing ENT:  no nasal polyps, mucus membranes moist Lungs:    No increased respiratory effort, symmetric chest wall excursion, clear to auscultation bilaterally, no wheezes or crackles CV:         Regular rate and rhythm; systolic murmur,  No pedal edema Abd:      + bowel sounds; soft, non-tender; no distension MSK: no acute synovitis of DIP or PIP joints, no mechanics hands.  Skin:      Warm and dry; no rashes Neuro: normal speech, no focal facial asymmetry Psych: alert and oriented x3, normal mood and affect  Data Reviewed/Medical Decision Making:  Independent interpretation of tests: Imaging: Chest xray shows median sternotomy wires and no acute cardiopulmonary process in Nov 2021.   Echocardiogram March 2021 1. Left  ventricular ejection fraction, by estimation, is 60 to 65%. The  left ventricle has normal function. The left ventricle has no regional  wall motion abnormalities. There is mild left ventricular hypertrophy.  Left ventricular diastolic parameters  are consistent with Grade III diastolic dysfunction (restrictive).  Elevated left atrial pressure.  2. Right ventricular systolic function is normal. The right ventricular  size is normal.  3. Left atrial size was mildly dilated.  4. . Moderate mitral valve regurgitation.  5. AV prosthesis (60mm bioprosthesis July 2013) is difficult to see. Peak  and mean gradients through the valve are 49 and 28 mm Hg respectively.  LVOT /AV VTI ratio is 0.22, relatively unchanged from previous echo in  2020. Marland Kitchen No AI  6. The inferior vena cava is normal in size with greater than 50%  respiratory variability, suggesting right atrial pressure of 3 mmHg.   PFTs: None on file  Labs:  Lab Results  Component Value Date   WBC 6.8 01/15/2021   HGB 12.2 01/15/2021   HCT 37.0 01/15/2021   MCV 88.7 01/15/2021   PLT 212 01/15/2021   Lab Results  Component Value Date   NA 140 01/15/2021   K 4.1 01/15/2021   CL 102 01/15/2021   CO2 29 01/15/2021    Immunization status:  Immunization History  Administered Date(s) Administered  . PFIZER(Purple Top)SARS-COV-2 Vaccination 02/07/2020, 03/13/2020  . Pneumococcal Conjugate-13 01/06/2014  . Pneumococcal Polysaccharide-23 04/05/2010, 08/24/2015  . Tdap 12/12/2006  . Zoster 01/06/2014    . I reviewed prior external note(s) from cardiology . I reviewed the result(s) of the labs and imaging as noted above.  . I have ordered PFTs  Assessment:  Mild intermittent asthma Chronic cough with UACS Covid 19 infection  Plan/Recommendations: Needs asthma management. Will order PFTs but this needs to wait until she has been 90 days out from covid infection in Jan.  Continue budesonide nebulizer with albuterol  nebulizer. She already has albuterol inhaler refill.  Suspect her symptoms will continue to improve as she recovers from covid infection.  We discussed disease management and progression at length today for asthma.  Return to Care: Return in about 3 months (around 04/28/2021).  Lenice Llamas, MD Pulmonary and [place]  CC: Steele Sizer, MD

## 2021-01-29 NOTE — Telephone Encounter (Signed)
   Telephone encounter was:  Successful.  01/29/2021 Name: [name] MRN: [id number] DOB: [birth year]  Bailey [name] is a 75 y.o. year old female who is a primary care patient of Steele Sizer, MD . The community resource team was consulted for assistance with assistance with hearing aids  Care guide performed the following interventions: Patient provided with information about care guide support team and interviewed to confirm resource needs Investigation of community resources performed Discussed resources to assist with hearing aids.  Follow Up Plan:  Client will let me know if she received email sent with information about scheduling a free hearing evaluation at [facility] and contacting Yolonda Kida at [facility] center for the Deaf and Hard of Hearing. Patient will be given a Certification page from the [place], AAS Paralegal, [place] . Embedded Care Coordination [facility]  Care Management  [address], [place] [address] ??[email or web address]  ?? [phone]   [email or web address]

## 2021-01-29 NOTE — Telephone Encounter (Signed)
   Telephone encounter was:  Successful.  01/29/2021 Name: Bailey [name] MRN: [id number] DOB: [birth year]  Bailey [name] is a 75 y.o. year old female who is a primary care patient of Steele Sizer, MD . The community resource team was consulted for assistance with assistance with hearing aids  Care guide performed the following interventions: Patient was is doctor's office. Will call again today per patient request..  Follow Up Plan:  I will call patient later today.  Fujie Dickison, AAS Paralegal, [place] . Embedded Care Coordination [facility]  Care Management  [address], [place] [address] ??[email or web address]  ?? [phone]   [email or web address]

## 2021-01-29 NOTE — Patient Instructions (Addendum)
The patient should have follow up scheduled with myself in 3 months.   Prior to next visit patient should have:  Continue budesonide nebulizers and albuterol inhaler.   Take the albuterol rescue treatments every 4 to 6 hours as needed for wheezing or shortness of breath.You can also take it 15 minutes before exercise or exertional activity. Side effects include heart racing or pounding, jitters or anxiety. If you have a history of an irregular heart rhythm, it can make this worse. Can also give some patients a hard time sleeping.  Take the albuterol rescue inhaler every 4 to 6 hours as needed for wheezing or shortness of breath. You can also take it 15 minutes before exercise or exertional activity. Side effects include heart racing or pounding, jitters or anxiety. If you have a history of an irregular heart rhythm, it can make this worse. Can also give some patients a hard time sleeping.  To inhale the aerosol using an inhaler, follow these steps:  1. Remove the protective dust cap from the end of the mouthpiece. If the dust cap was not placed on the mouthpiece, check the mouthpiece for dirt or other objects. Be sure that the canister is fully and firmly inserted in the mouthpiece. 2. If you are using the inhaler for the first time or if you have not used the inhaler in more than 14 days, you will need to prime it. You may also need to prime the inhaler if it has been dropped. Ask your pharmacist or check the manufacturer's information if this happens. To prime the inhaler, shake it well and then press down on the canister 4 times to release 4 sprays into the air, away from your face. Be careful not to get albuterol in your eyes. 3. Shake the inhaler well. 4. Breathe out as completely as possible through your mouth. 4. Hold the canister with the mouthpiece on the bottom, facing you and the canister pointing upward. Place the open end of the mouthpiece into your mouth. Close your lips tightly  around the mouthpiece. 6. Breathe in slowly and deeply through the mouthpiece.At the same time, press down once on the container to spray the medication into your mouth. 7. Try to hold your breath for 10 seconds. remove the inhaler, and breathe out slowly. 8. If you were told to use 2 puffs, wait 1 minute and then repeat steps 3-7. 9. Replace the protective cap on the inhaler. 10. Clean your inhaler regularly. Follow the manufacturer's directions carefully and ask your doctor or pharmacist if you have any questions about cleaning your inhaler.  Check the back of the inhaler to keep track of the total number of doses left on the inhaler.

## 2021-01-30 ENCOUNTER — Ambulatory Visit (INDEPENDENT_AMBULATORY_CARE_PROVIDER_SITE_OTHER): Payer: Medicare Other

## 2021-01-30 DIAGNOSIS — Z1231 Encounter for screening mammogram for malignant neoplasm of breast: Secondary | ICD-10-CM

## 2021-01-30 DIAGNOSIS — Z0000 Encounter for general adult medical examination without abnormal findings: Secondary | ICD-10-CM | POA: Diagnosis not present

## 2021-01-30 MED ORDER — ALBUTEROL SULFATE (2.5 MG/3ML) 0.083% IN NEBU
2.5000 mg | INHALATION_SOLUTION | Freq: Four times a day (QID) | RESPIRATORY_TRACT | 3 refills | Status: DC | PRN
Start: 2021-01-30 — End: 2021-02-02

## 2021-01-30 MED ORDER — BUDESONIDE 0.5 MG/2ML IN SUSP: 0.5000 mg | Freq: Two times a day (BID) | RESPIRATORY_TRACT | 3 refills | Status: AC

## 2021-01-30 NOTE — Addendum Note (Signed)
Addended by: Vanessa Barbara on: 01/30/2021 04:26 PM   Modules accepted: Orders

## 2021-01-30 NOTE — Patient Instructions (Signed)
Bailey [name] , Thank you for taking time to come for your Medicare Wellness Visit. I appreciate your ongoing commitment to your health goals. Please review the following plan we discussed and let me know if I can assist you in the future.   Screening recommendations/referrals: Colonoscopy: done 01/01/19. Repeat in 2025 Mammogram: done 04/20/20. Please contact the [facility] to schedule your next mammogram.  Bone Density: done 04/20/20 Recommended yearly ophthalmology/optometry visit for glaucoma screening and checkup Recommended yearly dental visit for hygiene and checkup  Vaccinations: Influenza vaccine: declined Pneumococcal vaccine: done 08/24/15 Tdap vaccine: due Shingles vaccine: Shingrix discussed. Please contact your pharmacy for coverage information.  Covid-19: done 02/07/20 & 03/13/20  Advanced directives: Advance directive discussed with you today. Even though you declined this today please call our office should you change your mind and we can give you the proper paperwork for you to fill out.  Conditions/risks identified: Keep up the great work!  Next appointment: Follow up in one year for your annual wellness visit    Preventive Care 65 Years and Older, Female Preventive care refers to lifestyle choices and visits with your health care provider that can promote health and wellness. What does preventive care include?  A yearly physical exam. This is also called an annual well check.  Dental exams once or twice a year.  Routine eye exams. Ask your health care provider how often you should have your eyes checked.  Personal lifestyle choices, including:  Daily care of your teeth and gums.  Regular physical activity.  Eating a healthy diet.  Avoiding tobacco and drug use.  Limiting alcohol use.  Practicing safe sex.  Taking low-dose aspirin every day.  Taking vitamin and mineral supplements as recommended by your health care provider. What  happens during an annual well check? The services and screenings done by your health care provider during your annual well check will depend on your age, overall health, lifestyle risk factors, and family history of disease. Counseling  Your health care provider may ask you questions about your:  Alcohol use.  Tobacco use.  Drug use.  Emotional well-being.  Home and relationship well-being.  Sexual activity.  Eating habits.  History of falls.  Memory and ability to understand (cognition).  Work and work Statistician.  Reproductive health. Screening  You may have the following tests or measurements:  Height, weight, and BMI.  Blood pressure.  Lipid and cholesterol levels. These may be checked every 5 years, or more frequently if you are over 63 years old.  Skin check.  Lung cancer screening. You may have this screening every year starting at age 78 if you have a 30-pack-year history of smoking and currently smoke or have quit within the past 15 years.  Fecal occult blood test (FOBT) of the stool. You may have this test every year starting at age 40.  Flexible sigmoidoscopy or colonoscopy. You may have a sigmoidoscopy every 5 years or a colonoscopy every 10 years starting at age 6.  Hepatitis C blood test.  Hepatitis B blood test.  Sexually transmitted disease (STD) testing.  Diabetes screening. This is done by checking your blood sugar (glucose) after you have not eaten for a while (fasting). You may have this done every 1-3 years.  Bone density scan. This is done to screen for osteoporosis. You may have this done starting at age 44.  Mammogram. This may be done every 1-2 years. Talk to your health care provider about how often  you should have regular mammograms. Talk with your health care provider about your test results, treatment options, and if necessary, the need for more tests. Vaccines  Your health care provider may recommend certain vaccines, such  as:  Influenza vaccine. This is recommended every year.  Tetanus, diphtheria, and acellular pertussis (Tdap, Td) vaccine. You may need a Td booster every 10 years.  Zoster vaccine. You may need this after age 62.  Pneumococcal 13-valent conjugate (PCV13) vaccine. One dose is recommended after age 4.  Pneumococcal polysaccharide (PPSV23) vaccine. One dose is recommended after age 79. Talk to your health care provider about which screenings and vaccines you need and how often you need them. This information is not intended to replace advice given to you by your health care provider. Make sure you discuss any questions you have with your health care provider. Document Released: 12/22/2015 Document Revised: 08/14/2016 Document Reviewed: 09/26/2015 Elsevier Interactive Patient Education  2017 [place] Prevention in the Home Falls can cause injuries. They can happen to people of all ages. There are many things you can do to make your home safe and to help prevent falls. What can I do on the outside of my home?  Regularly fix the edges of walkways and driveways and fix any cracks.  Remove anything that might make you trip as you walk through a door, such as a raised step or threshold.  Trim any bushes or trees on the path to your home.  Use bright outdoor lighting.  Clear any walking paths of anything that might make someone trip, such as rocks or tools.  Regularly check to see if handrails are loose or broken. Make sure that both sides of any steps have handrails.  Any raised decks and porches should have guardrails on the edges.  Have any leaves, snow, or ice cleared regularly.  Use sand or salt on walking paths during winter.  Clean up any spills in your garage right away. This includes oil or grease spills. What can I do in the bathroom?  Use night lights.  Install grab bars by the toilet and in the tub and shower. Do not use towel bars as grab bars.  Use  non-skid mats or decals in the tub or shower.  If you need to sit down in the shower, use a plastic, non-slip stool.  Keep the floor dry. Clean up any water that spills on the floor as soon as it happens.  Remove soap buildup in the tub or shower regularly.  Attach bath mats securely with double-sided non-slip rug tape.  Do not have throw rugs and other things on the floor that can make you trip. What can I do in the bedroom?  Use night lights.  Make sure that you have a light by your bed that is easy to reach.  Do not use any sheets or blankets that are too big for your bed. They should not hang down onto the floor.  Have a firm chair that has side arms. You can use this for support while you get dressed.  Do not have throw rugs and other things on the floor that can make you trip. What can I do in the kitchen?  Clean up any spills right away.  Avoid walking on wet floors.  Keep items that you use a lot in easy-to-reach places.  If you need to reach something above you, use a strong step stool that has a grab bar.  Keep electrical cords  out of the way.  Do not use floor polish or wax that makes floors slippery. If you must use wax, use non-skid floor wax.  Do not have throw rugs and other things on the floor that can make you trip. What can I do with my stairs?  Do not leave any items on the stairs.  Make sure that there are handrails on both sides of the stairs and use them. Fix handrails that are broken or loose. Make sure that handrails are as long as the stairways.  Check any carpeting to make sure that it is firmly attached to the stairs. Fix any carpet that is loose or worn.  Avoid having throw rugs at the top or bottom of the stairs. If you do have throw rugs, attach them to the floor with carpet tape.  Make sure that you have a light switch at the top of the stairs and the bottom of the stairs. If you do not have them, ask someone to add them for you. What  else can I do to help prevent falls?  Wear shoes that:  Do not have high heels.  Have rubber bottoms.  Are comfortable and fit you well.  Are closed at the toe. Do not wear sandals.  If you use a stepladder:  Make sure that it is fully opened. Do not climb a closed stepladder.  Make sure that both sides of the stepladder are locked into place.  Ask someone to hold it for you, if possible.  Clearly mark and make sure that you can see:  Any grab bars or handrails.  First and last steps.  Where the edge of each step is.  Use tools that help you move around (mobility aids) if they are needed. These include:  Canes.  Walkers.  Scooters.  Crutches.  Turn on the lights when you go into a dark area. Replace any light bulbs as soon as they burn out.  Set up your furniture so you have a clear path. Avoid moving your furniture around.  If any of your floors are uneven, fix them.  If there are any pets around you, be aware of where they are.  Review your medicines with your doctor. Some medicines can make you feel dizzy. This can increase your chance of falling. Ask your doctor what other things that you can do to help prevent falls. This information is not intended to replace advice given to you by your health care provider. Make sure you discuss any questions you have with your health care provider. Document Released: 09/21/2009 Document Revised: 05/02/2016 Document Reviewed: 12/30/2014 Elsevier Interactive Patient Education  2017 [employer].

## 2021-01-30 NOTE — Progress Notes (Signed)
Subjective:   Bailey Bailey [name] is a 75 y.o. female who presents for Medicare Annual (Subsequent) preventive examination.  Virtual Visit via Telephone Note  I connected with  Bailey Bailey [name] on 01/30/21 at 10:00 AM EST by telephone and verified that I am speaking with the correct person using two identifiers.  Location: Patient: home Provider: [place] Persons participating in the virtual visit: [place]   I discussed the limitations, risks, security and privacy concerns of performing an evaluation and management Bailey [name] by telephone and the availability of in person appointments. The patient expressed understanding and agreed to proceed.  Interactive audio and video telecommunications were attempted between this nurse and patient, however failed, due to patient having technical difficulties OR patient did not have access to video capability.  We continued and completed visit with audio only.  Some vital signs may be absent or patient reported.   Bailey Marker, LPN    Review of Systems     Cardiac Risk Factors include: advanced age (>61men, >54 women);diabetes mellitus;dyslipidemia;hypertension     Objective:    Today's Vitals   01/30/21 1009  PainSc: 3    There is no height or weight on file to calculate BMI.  Advanced Directives 01/30/2021 01/25/2020 08/28/2018 08/12/2018 08/05/2018 08/04/2018 08/18/2017  Does Patient Have a Medical Advance Directive? No No No No No No No  Would patient like information on creating a medical advance directive? No - Patient declined No - Patient declined No - Patient declined - No - Patient declined No - Patient declined -  Pre-existing out of facility DNR order (yellow form or pink MOST form) - - - - - - -    Current Medications (verified) Outpatient Encounter Medications as of 01/30/2021  Medication Sig  . albuterol (PROVENTIL) (2.5 MG/3ML) 0.083% nebulizer solution Take 3 mLs (2.5 mg total) by nebulization every 6 (six)  hours as needed for wheezing or shortness of breath.  Marland Kitchen albuterol (VENTOLIN HFA) 108 (90 Base) MCG/ACT inhaler Inhale 2 puffs into the lungs every 6 (six) hours as needed for wheezing or shortness of breath.  . Ascorbic Acid (VITAMIN C) 100 MG tablet Take 100 mg by mouth daily.  Marland Kitchen aspirin EC 81 MG tablet Take 81 mg by mouth daily.  . B Complex CAPS Take 1 tablet by mouth 2 (two) times daily.  . budesonide (PULMICORT) 0.5 MG/2ML nebulizer solution Take 2 mLs (0.5 mg total) by nebulization as needed.  . Cholecalciferol (VITAMIN D) 2000 UNITS tablet Take 2,000 Units by mouth every other day.   . Coenzyme Q10 (COQ10) 150 MG CAPS Take 150 mg by mouth 2 (two) times daily.  . enalapril (VASOTEC) 2.5 MG tablet Take 1 tablet (2.5 mg total) by mouth daily.  Bailey Bailey [name] Oil 1000 MG CAPS Take 1 capsule by mouth daily.  Marland Kitchen MAG THREONATE-NIACINAMIDE ER PO Take 800 mg by mouth 2 (two) times daily.  . Melatonin 12 MG TABS Take by mouth.  . Menatetrenone (VITAMIN K2) 100 MCG TABS Take by mouth. 180 mcg  . Moringa 500 MG CAPS Take 1 capsule by mouth daily.  Marland Kitchen Respiratory Therapy Supplies (NEBULIZER AIR TUBE/PLUGS) MISC 1 each by Does not apply route daily.  . diclofenac sodium (VOLTAREN) 1 % GEL Apply 2 g topically 4 (four) times daily. (Patient not taking: No sig reported)  . Probiotic Product (PROBIOTIC DAILY PO) Take 1 capsule by mouth daily. (Patient not taking: No sig reported)   No facility-administered encounter medications on file as  of 01/30/2021.    Allergies (verified) Dilaudid [hydromorphone hcl], Naproxen, Nsaids, Percocet [oxycodone-acetaminophen], Sulfa antibiotics, Latex, Other, Rosuvastatin, and Morphine and related   History: Past Medical History:  Diagnosis Date  . Allergy   . Arthritis   . Asthma   . B12 deficiency anemia   . Blood transfusion without reported diagnosis   . H/O aortic valve replacement 2013   at [facility]   . History of colon polyps   . History of kidney stones   . HTN  (hypertension)   . Hyperlipidemia   . Left nephrolithiasis   . OSA (obstructive sleep apnea)    STUDY 12 YRS CPAP RX-- BUT NONTOLERANT  . Right ureteral stone   . S/P aortic valve replacement    2013  AT [facility]--- CARDIOLOGIST--  DR CRAWFORD [facility]  . Seasonal allergies   . Sleep apnea    wears cpap   . Statin intolerance   . Type 2 diabetes mellitus ([place])    type 2   Past Surgical History:  Procedure Laterality Date  . AORTIC VALVE REPLACEMENT  06/2012   [facility]  . APPENDECTOMY  1958  . CHOLECYSTECTOMY  1978  . COLONOSCOPY  05-09-2014  . COLONOSCOPY    . CYSTOSCOPY/RETROGRADE/URETEROSCOPY Right 07/04/2014   Procedure: CYSTOSCOPY/RETROGRADE/URETEROSCOPY;  Surgeon: Bernestine Amass, MD;  Location: [facility];  Bailey [name]: Urology;  Laterality: Right;  . ENDARTERECTOMY Left 08/05/2018   Procedure: ENDARTERECTOMY CAROTID LEFT;  Surgeon: Waynetta Sandy, MD;  Location: [place];  Bailey [name]: Vascular;  Laterality: Left;  . ESOPHAGOGASTRODUODENOSCOPY    . EXTRACORPOREAL SHOCK WAVE LITHOTRIPSY Right 06/ 2015     [place]  . HOLMIUM LASER APPLICATION Right 1/88/4166   Procedure: HOLMIUM LASER APPLICATION;  Surgeon: Bernestine Amass, MD;  Location: [facility];  Bailey [name]: Urology;  Laterality: Right;  . PATCH ANGIOPLASTY Left 08/05/2018   Procedure: PATCH ANGIOPLASTY USING XENOSURE BIOLOGIC PATCH 1cm x 6cm;  Surgeon: Waynetta Sandy, MD;  Location: [place];  Bailey [name]: Vascular;  Laterality: Left;  . POLYPECTOMY    . TONSILLECTOMY  1953  . UPPER GASTROINTESTINAL ENDOSCOPY     Family History  Problem Relation Age of Onset  . Heart disease Mother   . Heart disease Father   . Asthma Daughter   . Asthma Maternal Grandfather   . Heart disease Sister        smoker  . Heart attack Sister 31  . Kidney cancer Sister   . Heart disease Sister        smoker  . Heart disease Sister        smoker  . Stomach cancer Maternal Grandmother   . Colon cancer Neg Hx   .  Esophageal cancer Neg Hx   . Rectal cancer Neg Hx   . Colon polyps Neg Hx    Social History   Socioeconomic History  . Marital status: Single    Spouse name: Not on file  . Number of children: 2  . Years of education: some college  . Highest education level: 12th grade  Occupational History  . Occupation: Works for [employer]-retired    Employer: [employer]  Tobacco Use  . Smoking status: Never Smoker  . Smokeless tobacco: Never Used  . Tobacco comment: smoking cessation materials not required  Vaping Use  . Vaping Use: Never used  Substance and Sexual Activity  . Alcohol use: No    Alcohol/week: 0.0 standard drinks  . Drug use: No  .  Sexual activity: Not Currently  Other Topics Concern  . Not on file  Social History Narrative   Divorced, 2 children. No longer working   Son is out of jail since 03/20/2020    Daughter and grandkids live in [place] alone but keeps grandkids often   Social Determinants of Health   Financial Resource Strain: Low Risk   . Difficulty of Paying Living Expenses: Not hard at all  Food Insecurity: No Food Insecurity  . Worried About Charity fundraiser in the Last Year: Never true  . Ran Out of Food in the Last Year: Never true  Transportation Needs: No Transportation Needs  . Lack of Transportation (Medical): No  . Lack of Transportation (Non-Medical): No  Physical Activity: Sufficiently Active  . Days of Exercise per Week: 5 days  . Minutes of Exercise per Session: 50 min  Stress: No Stress Concern Present  . Feeling of Stress : Not at all  Social Connections: Moderately Integrated  . Frequency of Communication with Friends and Family: More than three times a week  . Frequency of Social Gatherings with Friends and Family: More than three times a week  . Attends Religious Services: More than 4 times per year  . Active Member of Clubs or Organizations: Yes  . Attends Archivist Meetings: More than 4  times per year  . Marital Status: Divorced    Tobacco Counseling Counseling given: Not Answered Comment: smoking cessation materials not required   Clinical Intake:  Pre-visit preparation completed: Yes  Pain : 0-10 Pain Score: 3  Pain Type: Chronic pain Pain Location: Genitalia (arthritis) Pain Descriptors / Indicators: Aching,Sore Pain Onset: More than a month ago Pain Frequency: Constant     Nutritional Risks: None Diabetes: Yes CBG done?: No Did pt. bring in CBG monitor from home?: No  How often do you need to have someone help you when you read instructions, pamphlets, or other written materials from your doctor or pharmacy?: 1 - Never    Interpreter Needed?: No  Information entered by :: Bailey Marker LPN   Activities of Daily Living In your present state of health, do you have any difficulty performing the following activities: 01/30/2021 01/15/2021  Hearing? Tempie Donning  Comment wears hearing aids wears hearing aids  Vision? N N  Difficulty concentrating or making decisions? N N  Walking or climbing stairs? N N  Dressing or bathing? N N  Doing errands, shopping? N N  Preparing Food and eating ? N -  Using the Toilet? N -  In the past six months, have you accidently leaked urine? N -  Do you have problems with loss of bowel control? N -  Managing your Medications? N -  Managing your Finances? N -  Housekeeping or managing your Housekeeping? N -  Some recent data might be hidden    Patient Care Team: Steele Sizer, MD as PCP - General (Family Medicine) Gavin Pound, MD as Consulting Physician (Rheumatology) Angelia Mould, MD as Consulting Physician (Vascular Surgery) Leta Baptist, MD as Consulting Physician (Otolaryngology) Katy Apo, MD as Consulting Physician (Ophthalmology) [name], MD as Consulting Physician (Cardiology)  Indicate any recent Medical Services you may have received from other than [facility] providers in the past year  (date may be approximate).     Assessment:   This is a routine wellness examination for [place].  Hearing/Vision screen  Hearing Screening   125Hz  250Hz  500Hz  1000Hz  2000Hz  3000Hz  4000Hz   6000Hz  8000Hz   Right ear:           Left ear:           Comments: Pt wears hearing aids  Vision Screening Comments: Vision screenings with Dr. Katy Apo   Dietary issues and exercise activities discussed: Current Exercise Habits: Home exercise routine, Type of exercise: walking, Time (Minutes): 50, Frequency (Times/Week): 5, Weekly Exercise (Minutes/Week): 250, Intensity: Mild, Exercise limited by: respiratory conditions(s)  Goals    . DIET - INCREASE WATER INTAKE     Recommend to drink at least 6-8 8oz glasses of water per day.      Depression Screen PHQ 2/9 Scores 01/30/2021 01/15/2021 12/28/2020 05/17/2020 01/25/2020 01/06/2020 11/17/2019  PHQ - 2 Score 0 0 0 0 0 0 0  PHQ- 9 Score - - - 0 - 0 0    Fall Risk Fall Risk  01/30/2021 01/15/2021 12/28/2020 05/17/2020 01/25/2020  Falls in the past year? 0 0 0 0 0  Comment - - - - -  Number falls in past yr: 0 0 0 - 0  Injury with Fall? 0 0 0 - 0  Risk for fall due to : No Fall Risks - - - No Fall Risks  Risk for fall due to: Comment - - - - -  Follow up Falls prevention discussed - Falls evaluation completed - Falls prevention discussed    FALL RISK PREVENTION PERTAINING TO THE HOME:   Any stairs in or around the home? Yes  If so, are there any without handrails? No  Home free of loose throw rugs in walkways, pet beds, electrical cords, etc? Yes  Adequate lighting in your home to reduce risk of falls? Yes   ASSISTIVE DEVICES UTILIZED TO PREVENT FALLS:  Life alert? No  Use of a cane, walker or w/c? No  Grab bars in the bathroom? No  Shower chair or bench in shower? No  Elevated toilet seat or a handicapped toilet? No   TIMED UP AND GO:  Was the test performed? No .   Cognitive Function: Normal cognitive status assessed by direct observation by  this Nurse Health Advisor. No abnormalities found.          Immunizations Immunization History  Administered Date(s) Administered  . PFIZER(Purple Top)SARS-COV-2 Vaccination 02/07/2020, 03/13/2020  . Pneumococcal Conjugate-13 01/06/2014  . Pneumococcal Polysaccharide-23 04/05/2010, 08/24/2015  . Tdap 12/12/2006  . Zoster 01/06/2014    TDAP status: Due, Education has been provided regarding the importance of this vaccine. Advised may receive this vaccine at local pharmacy or Health Dept. Aware to provide a copy of the vaccination record if obtained from local pharmacy or Health Dept. Verbalized acceptance and understanding.  Flu Vaccine status: Declined, Education has been provided regarding the importance of this vaccine but patient still declined. Advised may receive this vaccine at local pharmacy or Health Dept. Aware to provide a copy of the vaccination record if obtained from local pharmacy or Health Dept. Verbalized acceptance and understanding.  Pneumococcal vaccine status: Up to date  Covid-19 vaccine status: Completed vaccines  Qualifies for Shingles Vaccine? Yes   Zostavax completed Yes   Shingrix Completed?: No.    Education has been provided regarding the importance of this vaccine. Patient has been advised to call insurance company to determine out of pocket expense if they have not yet received this vaccine. Advised may also receive vaccine at local pharmacy or Health Dept. Verbalized acceptance and understanding.  Screening Tests Health Maintenance  Topic Date Due  .  OPHTHALMOLOGY EXAM  09/22/2020  . COVID-19 Vaccine (3 - Booster for [place] series) 01/31/2021 (Originally 09/12/2020)  . INFLUENZA VACCINE  03/08/2021 (Originally 07/09/2020)  . TETANUS/TDAP  12/28/2021 (Originally 12/12/2016)  . MAMMOGRAM  04/20/2021  . FOOT EXAM  05/17/2021  . HEMOGLOBIN A1C  07/15/2021  . COLONOSCOPY (Pts 45-44yrs Insurance coverage will need to be confirmed)  01/02/2024  . DEXA SCAN   Completed  . Hepatitis C Screening  Completed  . PNA vac Low Risk Adult  Completed    Health Maintenance  Health Maintenance Due  Topic Date Due  . OPHTHALMOLOGY EXAM  09/22/2020    Colorectal cancer screening: Type of screening: Colonoscopy. Completed 01/01/19. Repeat every 3 years  Mammogram status: Completed 04/20/20. Repeat every year  Bone Density status: Completed 04/20/20. Results reflect: Bone density results: OSTEOPENIA. Repeat every 2 years.  Lung Cancer Screening: (Low Dose CT Chest recommended if Age 27-80 years, 30 pack-year currently smoking OR have quit w/in 15years.) does not qualify.   Additional Screening:  Hepatitis C Screening: does qualify; Completed 09/24/12  Vision Screening: Recommended annual ophthalmology exams for early detection of glaucoma and other disorders of the eye. Is the patient up to date with their annual eye exam?  Yes  Who is the provider or what is the name of the office in which the patient attends annual eye exams? Dr. Prudencio Burly  Dental Screening: Recommended annual dental exams for proper oral hygiene  Community Resource Referral / Chronic Care Management: CRR required this visit?  No   CCM required this visit?  No      Plan:     I have personally reviewed and noted the following in the patient's chart:   . Medical and social history . Use of alcohol, tobacco or illicit drugs  . Current medications and supplements . Functional ability and status . Nutritional status . Physical activity . Advanced directives . List of other physicians . Hospitalizations, surgeries, and ER visits in previous 12 months . Vitals . Screenings to include cognitive, depression, and falls . Referrals and appointments  In addition, I have reviewed and discussed with patient certain preventive protocols, quality metrics, and best practice recommendations. A written personalized care plan for preventive services as well as general preventive health  recommendations were provided to patient.     Bailey Marker, LPN   2/40/9735   Nurse Notes: none

## 2021-02-02 ENCOUNTER — Telehealth: Payer: Self-pay | Admitting: Internal Medicine

## 2021-02-02 ENCOUNTER — Telehealth: Payer: Self-pay

## 2021-02-02 ENCOUNTER — Other Ambulatory Visit: Payer: Self-pay

## 2021-02-02 ENCOUNTER — Ambulatory Visit (INDEPENDENT_AMBULATORY_CARE_PROVIDER_SITE_OTHER): Payer: Medicare Other | Admitting: Vascular Surgery

## 2021-02-02 ENCOUNTER — Encounter: Payer: Self-pay | Admitting: Vascular Surgery

## 2021-02-02 ENCOUNTER — Ambulatory Visit (HOSPITAL_COMMUNITY)
Admission: RE | Admit: 2021-02-02 | Discharge: 2021-02-02 | Disposition: A | Discharge status: Home / Self Care | Payer: Medicare Other | Source: Ambulatory Visit | Attending: Vascular Surgery | Admitting: Vascular Surgery

## 2021-02-02 VITALS — BP 135/81 | HR 65 | Temp 97.6°F | Resp 20 | Ht 62.0 in | Wt 165.0 lb

## 2021-02-02 DIAGNOSIS — I6523 Occlusion and stenosis of bilateral carotid arteries: Secondary | ICD-10-CM

## 2021-02-02 DIAGNOSIS — Z9889 Other specified postprocedural states: Secondary | ICD-10-CM

## 2021-02-02 MED ORDER — ALBUTEROL SULFATE (2.5 MG/3ML) 0.083% IN NEBU: 2.5000 mg | INHALATION_SOLUTION | Freq: Four times a day (QID) | RESPIRATORY_TRACT | 3 refills | Status: AC

## 2021-02-02 NOTE — Telephone Encounter (Signed)
   Telephone encounter was:  Unsuccessful.  02/02/2021 Name: Bailey [name] MRN: [id number] DOB: 24-Feb-1946  Unsuccessful outbound call made today to assist with:  hearing aids  Outreach Attempt:  2nd Attempt  A HIPAA compliant voice message was left requesting a return call.  Instructed patient to call back at [phone].  Yisroel Mullendore, AAS Paralegal, [place] . Embedded Care Coordination [facility]  Care Management  [address], [place] [address] ??[email or web address]  ?? [phone]   [email or web address]

## 2021-02-02 NOTE — Progress Notes (Signed)
Patient ID: Bailey [name], female   DOB: 12-Sep-1946, 75 y.o.   MRN: [id number]  Reason for Consult: Follow-up   Referred by Steele Sizer, MD  Subjective:     HPI:  Bailey [name] is a 75 y.o. female history of left carotid endarterectomy for asymptomatic disease.  Overall she has been doing very well staying very active.  She has no history of TIA stroke or amaurosis.  She is on aspirin she does not take statin for intolerance reasons.  She is done very well from medical standpoint other than having Covid recently and this has decreased her exercise tolerance considerably.  She has no complaints related to today's visit.  Past Medical History:  Diagnosis Date  . Allergy   . Arthritis   . Asthma   . B12 deficiency anemia   . Blood transfusion without reported diagnosis   . H/O aortic valve replacement 2013   at [facility]   . History of colon polyps   . History of kidney stones   . HTN (hypertension)   . Hyperlipidemia   . Left nephrolithiasis   . OSA (obstructive sleep apnea)    STUDY 12 YRS CPAP RX-- BUT NONTOLERANT  . Right ureteral stone   . S/P aortic valve replacement    2013  AT [facility]--- CARDIOLOGIST--  DR CRAWFORD [facility]  . Seasonal allergies   . Sleep apnea    wears cpap   . Statin intolerance   . Type 2 diabetes mellitus (HCC)    type 2   Family History  Problem Relation Age of Onset  . Heart disease Mother   . Heart disease Father   . Asthma Daughter   . Asthma Maternal Grandfather   . Heart disease Sister        smoker  . Heart attack Sister 9  . Kidney cancer Sister   . Heart disease Sister        smoker  . Heart disease Sister        smoker  . Stomach cancer Maternal Grandmother   . Colon cancer Neg Hx   . Esophageal cancer Neg Hx   . Rectal cancer Neg Hx   . Colon polyps Neg Hx    Past Surgical History:  Procedure Laterality Date  . AORTIC VALVE REPLACEMENT  06/2012   [facility]  . APPENDECTOMY  1958  . CHOLECYSTECTOMY  1978  . COLONOSCOPY   05-09-2014  . COLONOSCOPY    . CYSTOSCOPY/RETROGRADE/URETEROSCOPY Right 07/04/2014   Procedure: CYSTOSCOPY/RETROGRADE/URETEROSCOPY;  Surgeon: Bernestine Amass, MD;  Location: [facility];  Service: Urology;  Laterality: Right;  . ENDARTERECTOMY Left 08/05/2018   Procedure: ENDARTERECTOMY CAROTID LEFT;  Surgeon: Waynetta Sandy, MD;  Location: [place];  Service: Vascular;  Laterality: Left;  . ESOPHAGOGASTRODUODENOSCOPY    . EXTRACORPOREAL SHOCK WAVE LITHOTRIPSY Right 06/ 2015     [place]  . HOLMIUM LASER APPLICATION Right 4/00/8676   Procedure: HOLMIUM LASER APPLICATION;  Surgeon: Bernestine Amass, MD;  Location: [facility];  Service: Urology;  Laterality: Right;  . PATCH ANGIOPLASTY Left 08/05/2018   Procedure: PATCH ANGIOPLASTY USING XENOSURE BIOLOGIC PATCH 1cm x 6cm;  Surgeon: Waynetta Sandy, MD;  Location: [place];  Service: Vascular;  Laterality: Left;  . POLYPECTOMY    . TONSILLECTOMY  1953  . UPPER GASTROINTESTINAL ENDOSCOPY      Short Social History:  Social History   Tobacco Use  . Smoking status: Never Smoker  . Smokeless  tobacco: Never Used  . Tobacco comment: smoking cessation materials not required  Substance Use Topics  . Alcohol use: No    Alcohol/week: 0.0 standard drinks    Allergies  Allergen Reactions  . Dilaudid [Hydromorphone Hcl] Other (See Comments)    Severe hypotension - pt states that if she is given this medication it will kill her  . Naproxen Shortness Of Breath  . Nsaids Shortness Of Breath and Other (See Comments)    Causes asthma  . Percocet [Oxycodone-Acetaminophen] Shortness Of Breath    Can tolerate oxy IR  . Sulfa Antibiotics Hives, Rash and Shortness Of Breath  . Latex Hives  . Other Swelling    Eye drop preservative Specific agent unspecified  . Rosuvastatin Other (See Comments)    myalgia  . Morphine And Related     Pain with IM injection    Current Outpatient Medications  Medication Sig  Dispense Refill  . albuterol (PROVENTIL) (2.5 MG/3ML) 0.083% nebulizer solution Take 3 mLs (2.5 mg total) by nebulization every 6 (six) hours as needed for wheezing or shortness of breath. 150 mL 1  . albuterol (PROVENTIL) (2.5 MG/3ML) 0.083% nebulizer solution Take 3 mLs (2.5 mg total) by nebulization every 6 (six) hours. And as needed 180 mL 3  . albuterol (VENTOLIN HFA) 108 (90 Base) MCG/ACT inhaler Inhale 2 puffs into the lungs every 6 (six) hours as needed for wheezing or shortness of breath. 18 g 3  . Ascorbic Acid (VITAMIN C) 100 MG tablet Take 100 mg by mouth daily.    Marland Kitchen aspirin EC 81 MG tablet Take 81 mg by mouth daily.    . B Complex CAPS Take 1 tablet by mouth 2 (two) times daily.    . budesonide (PULMICORT) 0.5 MG/2ML nebulizer solution Take 2 mLs (0.5 mg total) by nebulization as needed. 120 mL 1  . budesonide (PULMICORT) 0.5 MG/2ML nebulizer solution Take 2 mLs (0.5 mg total) by nebulization in the morning and at bedtime. 60 mL 3  . Cholecalciferol (VITAMIN D) 2000 UNITS tablet Take 2,000 Units by mouth every other day.     . Coenzyme Q10 (COQ10) 150 MG CAPS Take 150 mg by mouth 2 (two) times daily.    . enalapril (VASOTEC) 2.5 MG tablet Take 1 tablet (2.5 mg total) by mouth daily. 90 tablet 1  . Krill Oil 1000 MG CAPS Take 1 capsule by mouth daily.    Marland Kitchen MAG THREONATE-NIACINAMIDE ER PO Take 800 mg by mouth 2 (two) times daily.    . Melatonin 12 MG TABS Take by mouth.    . Menatetrenone (VITAMIN K2) 100 MCG TABS Take by mouth. 180 mcg    . Moringa 500 MG CAPS Take 1 capsule by mouth daily. 30 capsule 0  . Probiotic Product (PROBIOTIC DAILY PO) Take 1 capsule by mouth daily.    Marland Kitchen Respiratory Therapy Supplies (NEBULIZER AIR TUBE/PLUGS) MISC 1 each by Does not apply route daily. 1 each 2  . diclofenac sodium (VOLTAREN) 1 % GEL Apply 2 g topically 4 (four) times daily. (Patient not taking: No sig reported) 1 Tube 0   No current facility-administered medications for this visit.     Review of Systems  Constitutional: Positive for fatigue.  HENT: HENT negative.  Eyes: Eyes negative.  Respiratory: Positive for shortness of breath.  GI: Gastrointestinal negative.  Musculoskeletal: Musculoskeletal negative.  Skin: Skin negative.  Neurological: Neurological negative. Hematologic: Hematologic/lymphatic negative.  Psychiatric: Psychiatric negative.        Objective:  Objective   Vitals:   02/02/21 1113 02/02/21 1118  BP: 120/61 135/81  Pulse: 65   Resp: 20   Temp: 97.6 F (36.4 C)   SpO2: 98%   Weight: 165 lb (74.8 kg)   Height: 5\' 2"  (1.575 m)    Body mass index is 30.18 kg/m.  Physical Exam HENT:     Head: Normocephalic.     Nose:     Comments: Wearing a mask Eyes:     Pupils: Pupils are equal, round, and reactive to light.  Neck:     Vascular: No carotid bruit.  Pulmonary:     Effort: Pulmonary effort is normal.     Breath sounds: Normal breath sounds.  Abdominal:     General: Abdomen is flat.  Musculoskeletal:        General: Normal range of motion.     Right lower leg: No edema.     Left lower leg: No edema.  Skin:    General: Skin is warm and dry.     Capillary Refill: Capillary refill takes less than 2 seconds.  Neurological:     General: No focal deficit present.     Mental Status: She is alert.  Psychiatric:        Mood and Affect: Mood normal.        Behavior: Behavior normal.        Thought Content: Thought content normal.        Judgment: Judgment normal.     Data: Right Carotid Findings:  +----------+--------+--------+--------+-------------------------+--------+       PSV cm/sEDV cm/sStenosisPlaque Description    Comments  +----------+--------+--------+--------+-------------------------+--------+  CCA Prox 105   16                          +----------+--------+--------+--------+-------------------------+--------+  CCA Mid  78   15                           +----------+--------+--------+--------+-------------------------+--------+  CCA Distal80   18       heterogenous             +----------+--------+--------+--------+-------------------------+--------+  ICA Prox 74   22   1-39%  heterogenous and calcific      +----------+--------+--------+--------+-------------------------+--------+  ICA Mid  102   27   1-39%                     +----------+--------+--------+--------+-------------------------+--------+  ICA Distal90   25                          +----------+--------+--------+--------+-------------------------+--------+  ECA    132   16                          +----------+--------+--------+--------+-------------------------+--------+   +----------+--------+-------+----------------+-------------------+       PSV cm/sEDV cmsDescribe    Arm Pressure (mmHG)  +----------+--------+-------+----------------+-------------------+  [id number]   3   Multiphasic, WNL            +----------+--------+-------+----------------+-------------------+   +---------+--------+--+--------+--+---------+  VertebralPSV cm/s52EDV cm/s18Antegrade  +---------+--------+--+--------+--+---------+      Left Carotid Findings:  +----------+--------+--------+--------+------------------+--------+       PSV cm/sEDV cm/sStenosisPlaque DescriptionComments  +----------+--------+--------+--------+------------------+--------+  CCA Prox 74   22       smooth            +----------+--------+--------+--------+------------------+--------+  CCA Mid  53   19                      +----------+--------+--------+--------+------------------+--------+  CCA Distal67   19                       +----------+--------+--------+--------+------------------+--------+  ICA Prox 75   29   1-39%  smooth            +----------+--------+--------+--------+------------------+--------+  ICA Mid  98   40   40-59%                +----------+--------+--------+--------+------------------+--------+  ICA Distal85   29                      +----------+--------+--------+--------+------------------+--------+  ECA    79   15                      +----------+--------+--------+--------+------------------+--------+   +----------+--------+--------+---------+-------------------+       PSV cm/sEDV cm/sDescribe Arm Pressure (mmHG)  +----------+--------+--------+---------+-------------------+  Subclavian102   5    Turbulent            +----------+--------+--------+---------+-------------------+   +---------+--------+--+--------+---------------+  VertebralPSV cm/s22EDV cm/sBi- directional  +---------+--------+--+--------+---------------+        Summary:  Right Carotid: Velocities in the right ICA are consistent with a 1-39%  stenosis.   Left Carotid: Velocities in the left ICA are consistent with a 40-59%  stenosis,        lower end or range   Vertebrals: Right vertebral artery demonstrates antegrade flow. Left  vertebral        artery demonstrates bidirectional flow.  Subclavians: Left subclavian artery flow was disturbed. Normal flow  hemodynamics        were seen in the right subclavian artery.      Assessment/Plan:     75 year old female status post left carotid endarterectomy.  Velocities are stable of bilateral carotid arteries today.  She remains on aspirin is intolerant to statins.  She will follow-up in 1 year with repeat carotid duplex.  We did discuss the signs and symptoms of stroke and she demonstrates  good understanding.     Waynetta Sandy MD [facility]

## 2021-02-02 NOTE — Telephone Encounter (Signed)
Called and spoke with [facility] and she stated that they will need the OV notes and the rx for the albuterol neb faxed over to them.  The albuterol was done incorrectly and it needs to say every 6 hours and as needed.  This has been printed out and will get VS to sign today as ND is not in the office.

## 2021-02-05 ENCOUNTER — Telehealth: Payer: Self-pay

## 2021-02-05 NOTE — Telephone Encounter (Signed)
   Telephone encounter was:  Unsuccessful.  02/05/2021 Name: Andjela Wickes MRN: [id number] DOB: 1945-12-18  Unsuccessful outbound call made today to assist with:  hearing aids  Outreach Attempt:  3rd Attempt.  Referral closed unable to contact patient.  A HIPAA compliant voice message was left requesting a return call.  Instructed patient to call back at [phone].  Evon Dejarnett, AAS Paralegal, [place] . Embedded Care Coordination [facility]  Care Management  [address], [place] [address] ??[email or web address]  ?? [phone]   [email or web address]

## 2021-02-08 DIAGNOSIS — H2000 Unspecified acute and subacute iridocyclitis: Secondary | ICD-10-CM | POA: Diagnosis not present

## 2021-02-15 DIAGNOSIS — H2000 Unspecified acute and subacute iridocyclitis: Secondary | ICD-10-CM | POA: Diagnosis not present

## 2021-02-15 DIAGNOSIS — H35372 Puckering of macula, left eye: Secondary | ICD-10-CM | POA: Diagnosis not present

## 2021-02-15 DIAGNOSIS — H43813 Vitreous degeneration, bilateral: Secondary | ICD-10-CM | POA: Diagnosis not present

## 2021-02-20 ENCOUNTER — Telehealth: Payer: Self-pay | Admitting: Cardiovascular Disease

## 2021-02-20 NOTE — Telephone Encounter (Signed)
Encounter not needed

## 2021-04-16 DIAGNOSIS — H52203 Unspecified astigmatism, bilateral: Secondary | ICD-10-CM | POA: Diagnosis not present

## 2021-04-16 DIAGNOSIS — Z961 Presence of intraocular lens: Secondary | ICD-10-CM | POA: Diagnosis not present

## 2021-04-16 DIAGNOSIS — E119 Type 2 diabetes mellitus without complications: Secondary | ICD-10-CM | POA: Diagnosis not present

## 2021-04-16 LAB — HM DIABETES EYE EXAM

## 2021-05-10 DIAGNOSIS — H6121 Impacted cerumen, right ear: Secondary | ICD-10-CM | POA: Diagnosis not present

## 2021-05-10 DIAGNOSIS — H838X3 Other specified diseases of inner ear, bilateral: Secondary | ICD-10-CM | POA: Diagnosis not present

## 2021-05-10 DIAGNOSIS — H903 Sensorineural hearing loss, bilateral: Secondary | ICD-10-CM | POA: Diagnosis not present

## 2021-05-23 DIAGNOSIS — L570 Actinic keratosis: Secondary | ICD-10-CM | POA: Diagnosis not present

## 2021-05-23 DIAGNOSIS — D225 Melanocytic nevi of trunk: Secondary | ICD-10-CM | POA: Diagnosis not present

## 2021-05-23 DIAGNOSIS — L814 Other melanin hyperpigmentation: Secondary | ICD-10-CM | POA: Diagnosis not present

## 2021-05-23 DIAGNOSIS — L578 Other skin changes due to chronic exposure to nonionizing radiation: Secondary | ICD-10-CM | POA: Diagnosis not present

## 2021-05-23 DIAGNOSIS — L821 Other seborrheic keratosis: Secondary | ICD-10-CM | POA: Diagnosis not present

## 2021-06-08 DIAGNOSIS — D485 Neoplasm of uncertain behavior of skin: Secondary | ICD-10-CM | POA: Diagnosis not present

## 2021-06-08 DIAGNOSIS — C44329 Squamous cell carcinoma of skin of other parts of face: Secondary | ICD-10-CM | POA: Diagnosis not present

## 2021-07-06 DIAGNOSIS — N644 Mastodynia: Secondary | ICD-10-CM | POA: Diagnosis not present

## 2021-07-06 DIAGNOSIS — M542 Cervicalgia: Secondary | ICD-10-CM | POA: Diagnosis not present

## 2021-07-09 DIAGNOSIS — C44319 Basal cell carcinoma of skin of other parts of face: Secondary | ICD-10-CM | POA: Diagnosis not present

## 2021-07-09 DIAGNOSIS — C44329 Squamous cell carcinoma of skin of other parts of face: Secondary | ICD-10-CM | POA: Diagnosis not present

## 2021-07-09 DIAGNOSIS — D485 Neoplasm of uncertain behavior of skin: Secondary | ICD-10-CM | POA: Diagnosis not present

## 2021-07-11 ENCOUNTER — Ambulatory Visit: Payer: Medicare Other | Admitting: Cardiovascular Disease

## 2021-07-20 ENCOUNTER — Inpatient Hospital Stay
Admit: 2021-07-20 | Discharge: 2021-07-21 | Disposition: A | Discharge status: Home / Self Care | Payer: MEDICARE | Attending: Emergency Medicine

## 2021-07-20 ENCOUNTER — Emergency Department: Admit: 2021-07-20 | Payer: MEDICARE

## 2021-07-20 DIAGNOSIS — D72829 Elevated white blood cell count, unspecified: Secondary | ICD-10-CM | POA: Diagnosis not present

## 2021-07-20 DIAGNOSIS — K449 Diaphragmatic hernia without obstruction or gangrene: Secondary | ICD-10-CM | POA: Diagnosis not present

## 2021-07-20 DIAGNOSIS — R112 Nausea with vomiting, unspecified: Secondary | ICD-10-CM | POA: Diagnosis not present

## 2021-07-20 DIAGNOSIS — R109 Unspecified abdominal pain: Secondary | ICD-10-CM | POA: Diagnosis not present

## 2021-07-20 DIAGNOSIS — R0602 Shortness of breath: Secondary | ICD-10-CM | POA: Diagnosis not present

## 2021-07-20 DIAGNOSIS — R197 Diarrhea, unspecified: Secondary | ICD-10-CM | POA: Diagnosis not present

## 2021-07-20 DIAGNOSIS — R1033 Periumbilical pain: Secondary | ICD-10-CM | POA: Diagnosis not present

## 2021-07-20 MED ORDER — SODIUM CHLORIDE 0.9 % IV BOLUS
0.9 | Freq: Once | INTRAVENOUS | Status: AC
Start: 2021-07-20 — End: 2021-07-20
  Administered 2021-07-20: 1000 mL via INTRAVENOUS

## 2021-07-20 MED ORDER — ONDANSETRON HCL 4 MG/2ML IJ SOLN
4 MG/2ML | Freq: Once | INTRAMUSCULAR | Status: AC
Start: 2021-07-20 — End: 2021-07-20
  Administered 2021-07-20: 4 mg via INTRAVENOUS

## 2021-07-20 MED ORDER — SODIUM CHLORIDE 0.9 % IV BOLUS
0.9 | Freq: Once | INTRAVENOUS | Status: DC
Start: 2021-07-20 — End: 2021-07-21

## 2021-07-20 MED ORDER — FENTANYL CITRATE (PF) 100 MCG/2ML IJ SOLN
100 MCG/2ML | Freq: Once | INTRAMUSCULAR | Status: DC
Start: 2021-07-20 — End: 2021-07-21

## 2021-07-20 MED FILL — ONDANSETRON HCL 4 MG/2ML IJ SOLN: 4 MG/2ML | INTRAMUSCULAR | Qty: 2

## 2021-07-20 MED FILL — FENTANYL CITRATE (PF) 100 MCG/2ML IJ SOLN: 100 MCG/2ML | INTRAMUSCULAR | Qty: 2

## 2021-07-20 NOTE — ED Notes (Signed)
Urine sent.     Jenell Milliner, RN  07/20/21 2121

## 2021-07-20 NOTE — ED Notes (Signed)
Discharge instructions given and patient verbalized understanding and amb out of the er without difficulty with family     Jenell Milliner, RN  07/20/21 2235

## 2021-07-20 NOTE — ED Provider Notes (Signed)
75 year old female presenting emergency department for nausea, vomiting, diarrhea.  Occurred shortly after she ate some homemade beef jerky this afternoon.  Has slowly been resolving, but initially was profuse, sudden onset, moderate to severe in severity, worsened by nothing except the jerky, improved by time.  She had some associated shortness of breath with that as she was very anxious and hyperventilating.  Denies any chest pain, chest tightness.  She denies any fevers or chills.  Did have recent antibiotic usage, Augmentin within the last week prophylactically for a excision on her scalp.       Review of Systems   Constitutional:  Negative for chills and fever.   HENT:  Negative for ear pain, sinus pressure and sore throat.    Eyes:  Negative for pain and discharge.   Respiratory:  Negative for cough, shortness of breath and wheezing.    Cardiovascular:  Negative for chest pain.   Gastrointestinal:  Positive for abdominal pain (Periumbilical), diarrhea, nausea and vomiting.   Genitourinary:  Negative for dysuria and frequency.   Musculoskeletal:  Negative for arthralgias and back pain.   Skin:  Negative for rash and wound.   Neurological:  Negative for weakness and headaches.   Hematological:  Negative for adenopathy.   All other systems reviewed and are negative.     Physical Exam  Vitals and nursing note reviewed.   Constitutional:       Appearance: Normal appearance. She is not ill-appearing or toxic-appearing.   HENT:      Head: Normocephalic and atraumatic.      Right Ear: External ear normal.      Left Ear: External ear normal.      Nose: Nose normal.      Mouth/Throat:      Mouth: Mucous membranes are moist.   Eyes:      Extraocular Movements: Extraocular movements intact.      Pupils: Pupils are equal, round, and reactive to light.   Cardiovascular:      Rate and Rhythm: Normal rate and regular rhythm.      Pulses: Normal pulses.      Heart sounds: Normal heart sounds.   Pulmonary:      Effort:  Pulmonary effort is normal.      Breath sounds: Normal breath sounds.   Abdominal:      General: Abdomen is flat. Bowel sounds are normal.      Palpations: Abdomen is soft.      Tenderness: There is abdominal tenderness (Mild periumbilical abdominal pain.).   Musculoskeletal:         General: Normal range of motion.      Cervical back: Normal range of motion and neck supple.   Skin:     General: Skin is warm and dry.   Neurological:      General: No focal deficit present.      Mental Status: She is alert and oriented to person, place, and time.      Cranial Nerves: No cranial nerve deficit.      Sensory: No sensory deficit.      Motor: No weakness.        Procedures     MDM     Amount and/or Complexity of Data Reviewed  Clinical lab tests: reviewed  Tests in the radiology section of CPT??: reviewed  Tests in the medicine section of CPT??: reviewed         ED Course as of 07/20/21 2217   Fri Jul 20, 2021  1956 EKG:  This EKG is signed by emergency department physician.    Rate: 87  Rhythm: Sinus  Interpretation: no acute changes  Comparison: was normal      [JG]   2212 Patient feels improved, would like to be discharged. [JG]   1714 75 year old female presented emerged for nausea vomiting diarrhea, occurred after eating some beef jerky.  Was given fluids and Zofran in the emergency department with improvement in symptoms.  Laboratory work relatively unremarkable other than elevated leukocytosis, which may be related to a stress response from the patient's nausea vomiting diarrhea.  Lactic acid not essentially elevated.  CT scan showed enteritis consistent with patient's symptoms.  She felt improved, shared decision making performed, offered admission for the significant leukocytosis, she was not agreeable to this prefer to be discharged with strict return precautions, and follow-up with her primary care physician. [JG]      ED Course User Index  [JG] Lanice Shirts, MD      75 year old female presented emerged for  nausea vomiting diarrhea, occurred after eating some beef jerky.  Was given fluids and Zofran in the emergency department with improvement in symptoms.  Laboratory work relatively unremarkable other than elevated leukocytosis, which may be related to a stress response from the patient's nausea vomiting diarrhea.  Lactic acid not essentially elevated.  CT scan showed enteritis consistent with patient's symptoms.  She felt improved, shared decision making performed, offered admission for the significant leukocytosis, she was not agreeable to this prefer to be discharged with strict return precautions, and follow-up with her primary care physician.      ED Course as of 07/20/21 2217   Fri Jul 20, 2021   1956 EKG:  This EKG is signed by emergency department physician.    Rate: 87  Rhythm: Sinus  Interpretation: no acute changes  Comparison: was normal      [JG]   2212 Patient feels improved, would like to be discharged. [JG]   3037 75 year old female presented emerged for nausea vomiting diarrhea, occurred after eating some beef jerky.  Was given fluids and Zofran in the emergency department with improvement in symptoms.  Laboratory work relatively unremarkable other than elevated leukocytosis, which may be related to a stress response from the patient's nausea vomiting diarrhea.  Lactic acid not essentially elevated.  CT scan showed enteritis consistent with patient's symptoms.  She felt improved, shared decision making performed, offered admission for the significant leukocytosis, she was not agreeable to this prefer to be discharged with strict return precautions, and follow-up with her primary care physician. [JG]      ED Course User Index  [JG] Lanice Shirts, MD       --------------------------------------------- PAST HISTORY ---------------------------------------------  Past Medical History:  has no past medical history on file.    Past Surgical History:  has no past surgical history on file.    Social History:       Family History: family history is not on file.     The patient???s home medications have been reviewed.    Allergies: Dilaudid [hydromorphone] and Nsaids    -------------------------------------------------- RESULTS -------------------------------------------------  Labs:  Results for orders placed or performed during the hospital encounter of 07/20/21   CBC with Auto Differential   Result Value Ref Range    WBC 20.3 (H) 4.5 - 11.5 E9/L    RBC 4.59 3.50 - 5.50 E12/L    Hemoglobin 13.8 11.5 - 15.5 g/dL    Hematocrit 63.8 46.6 -  48.0 %    MCV 92.6 80.0 - 99.9 fL    MCH 30.1 26.0 - 35.0 pg    MCHC 32.5 32.0 - 34.5 %    RDW 14.6 11.5 - 15.0 fL    Platelets 189 130 - 450 E9/L    MPV 10.5 7.0 - 12.0 fL    Neutrophils % 94.8 (H) 43.0 - 80.0 %    Lymphocytes % 0.0 (LL) 20.0 - 42.0 %    Monocytes % 3.5 2.0 - 12.0 %    Eosinophils % 0.0 0.0 - 6.0 %    Basophils % 0.0 0.0 - 2.0 %    Neutrophils Absolute 19.29 (H) 1.80 - 7.30 E9/L    Lymphocytes Absolute 0.41 (L) 1.50 - 4.00 E9/L    Monocytes Absolute 0.81 0.10 - 0.95 E9/L    Eosinophils Absolute 0.00 (L) 0.05 - 0.50 E9/L    Basophils Absolute 0.00 0.00 - 0.20 E9/L    Atypical Lymphocytes Relative 1.7 0.0 - 4.0 %    nRBC 0.0 /100 WBC    Acanthocytes 1+     Ovalocytes 1+    Basic Metabolic Panel w/ Reflex to MG   Result Value Ref Range    Sodium 141 132 - 146 mmol/L    Potassium reflex Magnesium 4.1 3.5 - 5.0 mmol/L    Chloride 101 98 - 107 mmol/L    CO2 26 22 - 29 mmol/L    Anion Gap 14 7 - 16 mmol/L    Glucose 112 (H) 74 - 99 mg/dL    BUN 27 (H) 6 - 23 mg/dL    Creatinine 0.8 0.5 - 1.0 mg/dL    GFR Non-African American >60 >=60 mL/min/1.73    GFR African American >60     Calcium 9.8 8.6 - 10.2 mg/dL   Hepatic Function Panel   Result Value Ref Range    Total Protein 7.3 6.4 - 8.3 g/dL    Albumin 4.5 3.5 - 5.2 g/dL    Alkaline Phosphatase 46 35 - 104 U/L    ALT 10 0 - 32 U/L    AST 20 0 - 31 U/L    Total Bilirubin 0.5 0.0 - 1.2 mg/dL    Bilirubin, Direct <1.6<0.2 0.0 - 0.3 mg/dL     Bilirubin, Indirect see below 0.0 - 1.0 mg/dL   Troponin   Result Value Ref Range    Troponin, High Sensitivity 13 (H) 0 - 9 ng/L   Brain Natriuretic Peptide   Result Value Ref Range    Pro-BNP 663 (H) 0 - 450 pg/mL   Lactic Acid   Result Value Ref Range    Lactic Acid 1.8 0.5 - 2.2 mmol/L   Urinalysis with Microscopic   Result Value Ref Range    Color, UA Yellow Straw/Yellow    Clarity, UA Clear Clear    Glucose, Ur Negative Negative mg/dL    Bilirubin Urine Negative Negative    Ketones, Urine 15 (A) Negative mg/dL    Specific Gravity, UA >=1.030 1.005 - 1.030    Blood, Urine Negative Negative    pH, UA 5.5 5.0 - 9.0    Protein, UA Negative Negative mg/dL    Urobilinogen, Urine 0.2 <2.0 E.U./dL    Nitrite, Urine Negative Negative    Leukocyte Esterase, Urine Negative Negative    WBC, UA NONE 0 - 5 /HPF    RBC, UA NONE 0 - 2 /HPF    Epithelial Cells, UA FEW /HPF    Bacteria, UA  NONE SEEN None Seen /HPF   EKG 12 Lead   Result Value Ref Range    Ventricular Rate 87 BPM    Atrial Rate 87 BPM    P-R Interval 144 ms    QRS Duration 74 ms    Q-T Interval 386 ms    QTc Calculation (Bazett) 464 ms    P Axis 68 degrees    R Axis 20 degrees    T Axis 43 degrees       Radiology:  CT ABDOMEN PELVIS WO CONTRAST Additional Contrast? None   Final Result   Fluid-filled large and small bowel loops.  Correlate with diarrheal   syndrome/gastroenteritis clinically.      Small hiatal hernia.             ------------------------- NURSING NOTES AND VITALS REVIEWED ---------------------------  Date / Time Roomed:  07/20/2021  6:22 PM  ED Bed Assignment:  12/12    The nursing notes within the ED encounter and vital signs as below have been reviewed.   BP (!) 131/55    Pulse 87    Temp 97.3 ??F (36.3 ??C) (Oral)    Resp 14    Ht 5\' 2"  (1.575 m)    Wt 160 lb (72.6 kg)    SpO2 97%    BMI 29.26 kg/m??   Oxygen Saturation Interpretation: Normal      ------------------------------------------ PROGRESS NOTES  ------------------------------------------  10:17 PM EDT  I have spoken with the patient and discussed today???s results, in addition to providing specific details for the plan of care and counseling regarding the diagnosis and prognosis.  Their questions are answered at this time and they are agreeable with the plan. I discussed at length with them reasons for immediate return here for re evaluation. They will followup with their primary care physician by calling their office tomorrow.      --------------------------------- ADDITIONAL PROVIDER NOTES ---------------------------------  At this time the patient is without objective evidence of an acute process requiring hospitalization or inpatient management.  They have remained hemodynamically stable throughout their entire ED visit and are stable for discharge with outpatient follow-up.     The plan has been discussed in detail and they are aware of the specific conditions for emergent return, as well as the importance of follow-up.      New Prescriptions    ONDANSETRON (ZOFRAN-ODT) 4 MG DISINTEGRATING TABLET    Take 1 tablet by mouth 3 times daily as needed for Nausea or Vomiting       Diagnosis:  1. Nausea vomiting and diarrhea    2. Leukocytosis, unspecified type        Disposition:  Patient's disposition: Discharge to home  Patient's condition is stable.          , MD  Resident  07/20/21 [phone]

## 2021-07-21 DIAGNOSIS — Z0389 Encounter for observation for other suspected diseases and conditions ruled out: Secondary | ICD-10-CM | POA: Diagnosis not present

## 2021-07-21 LAB — URINALYSIS WITH MICROSCOPIC
Bacteria, UA: NONE SEEN /HPF
Bilirubin Urine: NEGATIVE
Blood, Urine: NEGATIVE
Glucose, Ur: NEGATIVE mg/dL
Ketones, Urine: 15 mg/dL — AB
Leukocyte Esterase, Urine: NEGATIVE
Nitrite, Urine: NEGATIVE
Protein, UA: NEGATIVE mg/dL
Specific Gravity, UA: >=1.03 (ref 1.005–1.030)
Urobilinogen, Urine: 0.2 E.U./dL (ref <2.0)
pH, UA: 5.5 (ref 5.0–9.0)

## 2021-07-21 LAB — CBC WITH AUTO DIFFERENTIAL
Atypical Lymphocytes Relative: 1.7 % (ref 0.0–4.0)
Basophils %: 0 % (ref 0.0–2.0)
Basophils Absolute: 0 E9/L (ref 0.00–0.20)
Eosinophils %: 0 % (ref 0.0–6.0)
Eosinophils Absolute: 0 E9/L — ABNORMAL LOW (ref 0.05–0.50)
Hematocrit: 42.5 % (ref 34.0–48.0)
Hemoglobin: 13.8 g/dL (ref 11.5–15.5)
Lymphocytes %: 0 % — CL (ref 20.0–42.0)
Lymphocytes Absolute: 0.41 E9/L — ABNORMAL LOW (ref 1.50–4.00)
MCH: 30.1 pg (ref 26.0–35.0)
MCHC: 32.5 % (ref 32.0–34.5)
MCV: 92.6 fL (ref 80.0–99.9)
MPV: 10.5 fL (ref 7.0–12.0)
Monocytes %: 3.5 % (ref 2.0–12.0)
Monocytes Absolute: 0.81 E9/L (ref 0.10–0.95)
Neutrophils %: 94.8 % — ABNORMAL HIGH (ref 43.0–80.0)
Neutrophils Absolute: 19.29 E9/L — ABNORMAL HIGH (ref 1.80–7.30)
Platelets: 189 E9/L (ref 130–450)
RBC: 4.59 E12/L (ref 3.50–5.50)
RDW: 14.6 fL (ref 11.5–15.0)
WBC: 20.3 E9/L — ABNORMAL HIGH (ref 4.5–11.5)
nRBC: 0 /100 WBC

## 2021-07-21 LAB — BASIC METABOLIC PANEL W/ REFLEX TO MG FOR LOW K
Anion Gap: 14 mmol/L (ref 7–16)
BUN: 27 mg/dL — ABNORMAL HIGH (ref 6–23)
CO2: 26 mmol/L (ref 22–29)
Calcium: 9.8 mg/dL (ref 8.6–10.2)
Chloride: 101 mmol/L (ref 98–107)
Creatinine: 0.8 mg/dL (ref 0.5–1.0)
GFR African American: >60
GFR Non-African American: >60 mL/min/{1.73_m2} (ref >=60)
Glucose: 112 mg/dL — ABNORMAL HIGH (ref 74–99)
Potassium reflex Magnesium: 4.1 mmol/L (ref 3.5–5.0)
Sodium: 141 mmol/L (ref 132–146)

## 2021-07-21 LAB — HEPATIC FUNCTION PANEL
ALT: 10 U/L (ref 0–32)
AST: 20 U/L (ref 0–31)
Albumin: 4.5 g/dL (ref 3.5–5.2)
Alkaline Phosphatase: 46 U/L (ref 35–104)
Bilirubin, Direct: <0.2 mg/dL (ref 0.0–0.3)
Total Bilirubin: 0.5 mg/dL (ref 0.0–1.2)
Total Protein: 7.3 g/dL (ref 6.4–8.3)

## 2021-07-21 LAB — EKG 12-LEAD
Atrial Rate: 87 {beats}/min
P Axis: 68 degrees
P-R Interval: 144 ms
Q-T Interval: 386 ms
QRS Duration: 74 ms
QTc Calculation (Bazett): 464 ms
R Axis: 20 degrees
T Axis: 43 degrees
Ventricular Rate: 87 {beats}/min

## 2021-07-21 LAB — LACTIC ACID: Lactic Acid: 1.8 mmol/L (ref 0.5–2.2)

## 2021-07-21 LAB — BRAIN NATRIURETIC PEPTIDE: Pro-BNP: 663 pg/mL — ABNORMAL HIGH (ref 0–450)

## 2021-07-21 LAB — TROPONIN: Troponin, High Sensitivity: 13 ng/L — ABNORMAL HIGH (ref 0–9)

## 2021-07-21 MED ORDER — ONDANSETRON 4 MG PO TBDP
4 MG | ORAL_TABLET | Freq: Three times a day (TID) | ORAL | 0 refills | Status: AC | PRN
Start: 2021-07-21 — End: ?

## 2021-07-23 DIAGNOSIS — Z4802 Encounter for removal of sutures: Secondary | ICD-10-CM | POA: Diagnosis not present

## 2021-07-31 ENCOUNTER — Other Ambulatory Visit: Payer: Self-pay | Admitting: Family Medicine

## 2021-07-31 DIAGNOSIS — I10 Essential (primary) hypertension: Secondary | ICD-10-CM

## 2021-08-02 NOTE — Progress Notes (Signed)
Name: Bailey [name]   MRN: [id number]    DOB: 09-14-1946   Date:08/07/2021       Progress Note  Subjective  Chief Complaint  Follow Up  HPI  DMII: A1C today is normal at 5.0 % .  She has not been checking her glucose at home but denies polyphagia, polydipsia or polyuria.  She has been physically active, but not as much since she had COVID and has sob with activity .   On Ace for kidney protection, for  Dyslipidemia  she only believes in fish oil, she has  intolerance to statins . Discussed PKS9 and also Ezetimide in the past  but she is not interested   HTN: taking medication and denies side effects of medication, bp is at goal, continue vasotec 2.5 mg daily    Overweight : she is physically active, she has lost 15 lbs since 01 /2020 - but has been stable for the past 4 months. Down from 167 12/2018  It was around  at 152 lbs  Today it is 162 lbs. Continue life style modificaiton    Dyslipidemia: unable to tolerate statin therapy , she is taking fish oil only. She is aware of importance of medication, she refuses Zetia of PKS9 injection. Unchanged    History of carotid endarterectomy left side: rlast visit with Dr. Donzetta Matters was 02/02/2021 and had repeat carotid doppler that showed velocities were stable , advised to follow up in one year    Hearing loss: unchanged    Inflammatory arthritis : she was seeing Rheumatologist and was taking Plaquenil but she stopped taking it on her own. She states her aches and pain are stable, mostly on her hands and wrists. She had [place] of her wrist and it was very expensive and decided not to go back to Rheumatologist.   Gatroenteritis : she was in [place] and developed acute onset of abdominal pain and diarrhea, went to [facility] ,she was dehydrated, given fluids and sent home, symptoms resolved , however over the past two weeks she has noticed episodes of loose stools or constipation, discussed referral to GI but she would like to hold off for now.    Patient Active  Problem List   Diagnosis Date Noted   Dyslipidemia associated with type 2 diabetes mellitus ([place]) 05/17/2020   Recurrent pancreatitis 05/26/2019   Left carotid artery stenosis 08/05/2018   OSA (obstructive sleep apnea) 01/20/2017   Carotid atherosclerosis, bilateral 01/20/2017   Abnormal brain MRI 08/26/2016   Inflammatory polyarthritis ([place]) 11/12/2015   Erosive osteoarthritis of hand 11/12/2015   History of vitamin D deficiency 11/06/2015   Hearing loss 08/24/2015   Asthma, mild intermittent 08/23/2015   Arthritis, degenerative 08/23/2015   Dyslipidemia 08/23/2015   H/O: hypothyroidism 08/23/2015   Obesity (BMI 30-39.9) 08/23/2015   HZV (herpes zoster virus) post herpetic neuralgia 08/23/2015   History of renal stone 04/01/2014   Diabetes mellitus, controlled ([place]) 04/01/2014   Essential hypertension, benign 04/01/2014   Aortic stenosis 06/16/2012   H/O prosthetic heart valve 06/15/2012    Past Surgical History:  Procedure Laterality Date   AORTIC VALVE REPLACEMENT  06/2012   [place]   COLONOSCOPY  05-09-2014   COLONOSCOPY     CYSTOSCOPY/RETROGRADE/URETEROSCOPY Right 07/04/2014   Procedure: CYSTOSCOPY/RETROGRADE/URETEROSCOPY;  Surgeon: Bernestine Amass, MD;  Location: [facility];  Service: Urology;  Laterality: Right;   ENDARTERECTOMY Left 08/05/2018   Procedure: ENDARTERECTOMY CAROTID LEFT;  Surgeon:  Waynetta Sandy, MD;  Location: [place];  Service: Vascular;  Laterality: Left;   ESOPHAGOGASTRODUODENOSCOPY     EXTRACORPOREAL SHOCK WAVE LITHOTRIPSY Right 06/ 2015     [place]   HOLMIUM LASER APPLICATION Right 05/30/6332   Procedure: HOLMIUM LASER APPLICATION;  Surgeon: Bernestine Amass, MD;  Location: [facility];  Service: Urology;  Laterality: Right;   PATCH ANGIOPLASTY Left 08/05/2018   Procedure: PATCH ANGIOPLASTY USING XENOSURE BIOLOGIC PATCH 1cm x 6cm;  Surgeon: Waynetta Sandy, MD;   Location: [facility];  Service: Vascular;  Laterality: Left;   POLYPECTOMY     TONSILLECTOMY  1953   UPPER GASTROINTESTINAL ENDOSCOPY      Family History  Problem Relation Age of Onset   Heart disease Mother    Heart disease Father    Asthma Daughter    Asthma Maternal Grandfather    Heart disease Sister        smoker   Heart attack Sister 60   Kidney cancer Sister    Heart disease Sister        smoker   Heart disease Sister        smoker   Stomach cancer Maternal Grandmother    Colon cancer Neg Hx    Esophageal cancer Neg Hx    Rectal cancer Neg Hx    Colon polyps Neg Hx     Social History   Tobacco Use   Smoking status: Never   Smokeless tobacco: Never   Tobacco comments:    smoking cessation materials not required  Substance Use Topics   Alcohol use: No    Alcohol/week: 0.0 standard drinks     Current Outpatient Medications:    albuterol (PROVENTIL) (2.5 MG/3ML) 0.083% nebulizer solution, Take 3 mLs (2.5 mg total) by nebulization every 6 (six) hours. And as needed, Disp: 180 mL, Rfl: 3   albuterol (VENTOLIN HFA) 108 (90 Base) MCG/ACT inhaler, Inhale 2 puffs into the lungs every 6 (six) hours as needed for wheezing or shortness of breath., Disp: 18 g, Rfl: 3   Ascorbic Acid (VITAMIN C) 100 MG tablet, Take 100 mg by mouth daily., Disp: , Rfl:    aspirin EC 81 MG tablet, Take 81 mg by mouth daily., Disp: , Rfl:    B Complex CAPS, Take 1 tablet by mouth 2 (two) times daily., Disp: , Rfl:    budesonide (PULMICORT) 0.5 MG/2ML nebulizer solution, Take 2 mLs (0.5 mg total) by nebulization in the morning and at bedtime., Disp: 60 mL, Rfl: 3   Cholecalciferol (VITAMIN D) 2000 UNITS tablet, Take 2,000 Units by mouth every other day. , Disp: , Rfl:    Coenzyme Q10 (COQ10) 150 MG CAPS, Take 150 mg by mouth 2 (two) times daily., Disp: , Rfl:    diclofenac sodium (VOLTAREN) 1 % GEL, Apply 2 g topically 4 (four) times daily., Disp: 1 Tube, Rfl: 0   Krill Oil 1000 MG CAPS, Take 1  capsule by mouth daily., Disp: , Rfl:    MAG THREONATE-NIACINAMIDE ER PO, Take 800 mg by mouth 2 (two) times daily., Disp: , Rfl:    Melatonin 12 MG TABS, Take by mouth., Disp: , Rfl:    Menatetrenone (VITAMIN K2) 100 MCG TABS, Take by mouth. 180 mcg, Disp: , Rfl:    Moringa 500 MG CAPS, Take 1 capsule by mouth daily., Disp: 30 capsule, Rfl: 0   Respiratory Therapy Supplies (NEBULIZER AIR TUBE/PLUGS) MISC, 1 each by Does not apply route daily., Disp: 1  each, Rfl: 2   enalapril (VASOTEC) 2.5 MG tablet, Take 1 tablet (2.5 mg total) by mouth daily., Disp: 90 tablet, Rfl: 1  Allergies  Allergen Reactions   Dilaudid [Hydromorphone Hcl] Other (See Comments)    Severe hypotension - pt states that if she is given this medication it will kill her   Naproxen Shortness Of Breath   Nsaids Shortness Of Breath and Other (See Comments)    Causes asthma   Percocet [Oxycodone-Acetaminophen] Shortness Of Breath    Can tolerate oxy IR   Sulfa Antibiotics Hives, Rash and Shortness Of Breath   Latex Hives   Other Swelling    Eye drop preservative Specific agent unspecified   Rosuvastatin Other (See Comments)    myalgia   Morphine And Related     Pain with IM injection    I personally reviewed active problem list, medication list, allergies, family history, social history, health maintenance with the patient/caregiver today.   ROS  Constitutional: Negative for fever or weight change.  Respiratory: Negative for cough and shortness of breath.   Cardiovascular: Negative for chest pain or palpitations.  Gastrointestinal: Negative for abdominal pain, positive for intermittent  bowel changes - she thinks since gastroenteritis, she will monitor   Musculoskeletal: Negative for gait problem or joint swelling.  Skin: Negative for rash.  Neurological: Negative for dizziness or headache.  No other specific complaints in a complete review of systems (except as listed in HPI above).    Objective  Vitals:    08/06/21 1435  BP: 120/64  Pulse: 84  Resp: 16  Temp: 97.9 F (36.6 C)  SpO2: 98%  Weight: 162 lb (73.5 kg)  Height: 5' 2" (1.575 m)    Body mass index is 29.63 kg/m.  Physical Exam  Constitutional: Patient appears well-developed and well-nourished. Overweight.  No distress.  HEENT: head atraumatic, normocephalic, pupils equal and reactive to light, neck supple Cardiovascular: Normal rate, regular rhythm, 3/6 SEM, opening click Trace  BLE edema. Pulmonary/Chest: Effort normal and breath sounds normal. No respiratory distress. Abdominal: Soft.  There is no tenderness. Psychiatric: Patient has a normal mood and affect. behavior is normal. Judgment and thought content normal.   Recent Results (from the past 2160 hour(s))  POCT HgB A1C     Status: None   Collection Time: 08/06/21  2:40 PM  Result Value Ref Range   Hemoglobin A1C 5.0 4.0 - 5.6 %   HbA1c POC (<> result, manual entry)     HbA1c, POC (prediabetic range)     HbA1c, POC (controlled diabetic range)    CBC with Differential/Platelet     Status: Abnormal   Collection Time: 08/06/21  3:42 PM  Result Value Ref Range   WBC 5.7 3.8 - 10.8 Thousand/uL   RBC 4.66 3.80 - 5.10 Million/uL   Hemoglobin 13.5 11.7 - 15.5 g/dL   HCT 42.5 35.0 - 45.0 %   MCV 91.2 80.0 - 100.0 fL   MCH 29.0 27.0 - 33.0 pg   MCHC 31.8 (L) 32.0 - 36.0 g/dL   RDW 13.1 11.0 - 15.0 %   Platelets 207 140 - 400 Thousand/uL   MPV 11.0 7.5 - 12.5 fL   Neutro Abs 3,032 1,500 - 7,800 cells/uL   Lymphs Abs 1,841 850 - 3,900 cells/uL   Absolute Monocytes 684 200 - 950 cells/uL   Eosinophils Absolute 103 15 - 500 cells/uL   Basophils Absolute 40 0 - 200 cells/uL   Neutrophils Relative % 53.2 %  Total Lymphocyte 32.3 %   Monocytes Relative 12.0 %   Eosinophils Relative 1.8 %   Basophils Relative 0.7 %  COMPLETE METABOLIC PANEL WITH GFR     Status: None   Collection Time: 08/06/21  3:42 PM  Result Value Ref Range   Glucose, Bld 92 65 - 99 mg/dL     Comment: .            Fasting reference interval .    BUN 14 7 - 25 mg/dL   Creat 0.60 0.60 - 1.00 mg/dL   eGFR 94 > OR = 60 mL/min/1.71m    Comment: The eGFR is based on the CKD-EPI 2021 equation. To calculate  the new eGFR from a previous Creatinine or Cystatin C result, go to https://www.kidney.org/professionals/ kdoqi/[email or web address]    BUN/Creatinine Ratio NOT APPLICABLE 6 - 22 (calc)   Sodium 139 135 - 146 mmol/L   Potassium 4.6 3.5 - 5.3 mmol/L   Chloride 99 98 - 110 mmol/L   CO2 32 20 - 32 mmol/L   Calcium 10.0 8.6 - 10.4 mg/dL   Total Protein 6.8 6.1 - 8.1 g/dL   Albumin 4.4 3.6 - 5.1 g/dL   Globulin 2.4 1.9 - 3.7 g/dL (calc)   AG Ratio 1.8 1.0 - 2.5 (calc)   Total Bilirubin 0.8 0.2 - 1.2 mg/dL   Alkaline phosphatase (APISO) 39 37 - 153 U/L   AST 18 10 - 35 U/L   ALT 10 6 - 29 U/L    Diabetic Foot Exam: Diabetic Foot Exam - Simple   Simple Foot Form Diabetic Foot exam was performed with the following findings: Yes 08/06/2021  3:26 PM  Visual Inspection No deformities, no ulcerations, no other skin breakdown bilaterally: Yes Sensation Testing Intact to touch and monofilament testing bilaterally: Yes Pulse Check Posterior Tibialis and Dorsalis pulse intact bilaterally: Yes Comments      PHQ2/9: [facility] 08/06/2021 01/30/2021 01/15/2021 12/28/2020 05/17/2020  Decreased Interest 0 0 0 0 0  Down, Depressed, Hopeless 0 0 0 0 0  PHQ - 2 Score 0 0 0 0 0  Altered sleeping - - - - 0  Tired, decreased energy - - - - 0  Change in appetite - - - - 0  Feeling bad or failure about yourself  - - - - 0  Trouble concentrating - - - - 0  Moving slowly or fidgety/restless - - - - 0  Suicidal thoughts - - - - 0  PHQ-9 Score - - - - 0  Difficult doing work/chores - - - - -  Some recent data might be hidden    phq 9 is negative   Fall Risk: Fall Risk  08/06/2021 01/30/2021 01/15/2021 12/28/2020 05/17/2020  Falls in the past year? 0 0 0 0 0  Comment - - - - -   Number falls in past yr: 0 0 0 0 -  Injury with Fall? 0 0 0 0 -  Risk for fall due to : No Fall Risks No Fall Risks - - -  Risk for fall due to: Comment - - - - -  Follow up Falls prevention discussed Falls prevention discussed - Falls evaluation completed -      Functional Status Survey: Is the patient deaf or have difficulty hearing?: Yes Does the patient have difficulty seeing, even when wearing glasses/contacts?: No Does the patient have difficulty concentrating, remembering, or making decisions?: No Does the patient have difficulty walking or climbing stairs?: No Does  the patient have difficulty dressing or bathing?: No Does the patient have difficulty doing errands alone such as visiting a doctor's office or shopping?: No    Assessment & Plan  1. Dyslipidemia associated with type 2 diabetes mellitus (HCC)  - POCT HgB A1C - [facility] Diabetes Foot Exam - COMPLETE METABOLIC PANEL WITH GFR  2. Leukocytosis, unspecified type  - CBC with Differential/Platelet  3. Senile purpura (HCC)  Stable   4. OSA (obstructive sleep apnea)   5. Hypertension, benign  - enalapril (VASOTEC) 2.5 MG tablet; Take 1 tablet (2.5 mg total) by mouth daily.  Dispense: 90 tablet; Refill: 1  6. Diabetes mellitus type 2 in obese ([place])   7. Inflammatory polyarthritis ([place])   8. Carotid atherosclerosis, bilateral   9. Mild intermittent asthma without complication   10. Vitamin D deficiency   11. Needs flu shot  She will hold off for now   12. Need for shingles vaccine  - Zoster Vaccine Adjuvanted [facility] injection; Inject 0.5 mLs into the muscle once for 1 dose.  Dispense: 0.5 mL; Refill: 1

## 2021-08-06 ENCOUNTER — Ambulatory Visit (INDEPENDENT_AMBULATORY_CARE_PROVIDER_SITE_OTHER): Payer: Medicare Other | Admitting: Family Medicine

## 2021-08-06 ENCOUNTER — Encounter: Payer: Self-pay | Admitting: Family Medicine

## 2021-08-06 ENCOUNTER — Other Ambulatory Visit: Payer: Self-pay

## 2021-08-06 VITALS — BP 120/64 | HR 84 | Temp 97.9°F | Resp 16 | Ht 62.0 in | Wt 162.0 lb

## 2021-08-06 DIAGNOSIS — D72829 Elevated white blood cell count, unspecified: Secondary | ICD-10-CM

## 2021-08-06 DIAGNOSIS — E559 Vitamin D deficiency, unspecified: Secondary | ICD-10-CM | POA: Diagnosis not present

## 2021-08-06 DIAGNOSIS — I6523 Occlusion and stenosis of bilateral carotid arteries: Secondary | ICD-10-CM | POA: Diagnosis not present

## 2021-08-06 DIAGNOSIS — D692 Other nonthrombocytopenic purpura: Secondary | ICD-10-CM

## 2021-08-06 DIAGNOSIS — E785 Hyperlipidemia, unspecified: Secondary | ICD-10-CM | POA: Diagnosis not present

## 2021-08-06 DIAGNOSIS — I10 Essential (primary) hypertension: Secondary | ICD-10-CM | POA: Diagnosis not present

## 2021-08-06 DIAGNOSIS — Z23 Encounter for immunization: Secondary | ICD-10-CM

## 2021-08-06 DIAGNOSIS — M064 Inflammatory polyarthropathy: Secondary | ICD-10-CM | POA: Diagnosis not present

## 2021-08-06 DIAGNOSIS — J4520 Mild intermittent asthma, uncomplicated: Secondary | ICD-10-CM

## 2021-08-06 DIAGNOSIS — G4733 Obstructive sleep apnea (adult) (pediatric): Secondary | ICD-10-CM | POA: Diagnosis not present

## 2021-08-06 DIAGNOSIS — E669 Obesity, unspecified: Secondary | ICD-10-CM | POA: Diagnosis not present

## 2021-08-06 DIAGNOSIS — E1169 Type 2 diabetes mellitus with other specified complication: Secondary | ICD-10-CM

## 2021-08-06 LAB — POCT GLYCOSYLATED HEMOGLOBIN (HGB A1C): Hemoglobin A1C: 5 % (ref 4.0–5.6)

## 2021-08-06 MED ORDER — SHINGRIX 50 MCG/0.5ML IM SUSR: 0.5000 mL | Freq: Once | INTRAMUSCULAR | 1 refills | Status: AC

## 2021-08-06 MED ORDER — ENALAPRIL MALEATE 2.5 MG PO TABS: 2.5000 mg | ORAL_TABLET | Freq: Every day | ORAL | 1 refills | Status: DC

## 2021-08-06 MED ORDER — SHINGRIX 50 MCG/0.5ML IM SUSR: 0.5000 mL | Freq: Once | INTRAMUSCULAR | 1 refills | Status: DC

## 2021-08-07 LAB — COMPLETE METABOLIC PANEL WITH GFR
AG Ratio: 1.8 (calc) (ref 1.0–2.5)
ALT: 10 U/L (ref 6–29)
AST: 18 U/L (ref 10–35)
Albumin: 4.4 g/dL (ref 3.6–5.1)
Alkaline phosphatase (APISO): 39 U/L (ref 37–153)
BUN: 14 mg/dL (ref 7–25)
CO2: 32 mmol/L (ref 20–32)
Calcium: 10 mg/dL (ref 8.6–10.4)
Chloride: 99 mmol/L (ref 98–110)
Creat: 0.6 mg/dL (ref 0.60–1.00)
Globulin: 2.4 g/dL (calc) (ref 1.9–3.7)
Glucose, Bld: 92 mg/dL (ref 65–99)
Potassium: 4.6 mmol/L (ref 3.5–5.3)
Sodium: 139 mmol/L (ref 135–146)
Total Bilirubin: 0.8 mg/dL (ref 0.2–1.2)
Total Protein: 6.8 g/dL (ref 6.1–8.1)
eGFR: 94 mL/min/{1.73_m2} (ref >=60)

## 2021-08-07 LAB — CBC WITH DIFFERENTIAL/PLATELET
Absolute Monocytes: 684 {cells}/uL (ref 200–950)
Basophils Absolute: 40 {cells}/uL (ref 0–200)
Basophils Relative: 0.7 %
Eosinophils Absolute: 103 {cells}/uL (ref 15–500)
Eosinophils Relative: 1.8 %
HCT: 42.5 % (ref 35.0–45.0)
Hemoglobin: 13.5 g/dL (ref 11.7–15.5)
Lymphs Abs: 1841 {cells}/uL (ref 850–3900)
MCH: 29 pg (ref 27.0–33.0)
MCHC: 31.8 g/dL — ABNORMAL LOW (ref 32.0–36.0)
MCV: 91.2 fL (ref 80.0–100.0)
MPV: 11 fL (ref 7.5–12.5)
Monocytes Relative: 12 %
Neutro Abs: 3032 {cells}/uL (ref 1500–7800)
Neutrophils Relative %: 53.2 %
Platelets: 207 Thousand/uL (ref 140–400)
RBC: 4.66 Million/uL (ref 3.80–5.10)
RDW: 13.1 % (ref 11.0–15.0)
Total Lymphocyte: 32.3 %
WBC: 5.7 Thousand/uL (ref 3.8–10.8)

## 2021-08-27 DIAGNOSIS — C44319 Basal cell carcinoma of skin of other parts of face: Secondary | ICD-10-CM | POA: Diagnosis not present

## 2021-09-26 DIAGNOSIS — H43812 Vitreous degeneration, left eye: Secondary | ICD-10-CM | POA: Diagnosis not present

## 2021-11-21 DIAGNOSIS — Z23 Encounter for immunization: Secondary | ICD-10-CM | POA: Diagnosis not present

## 2021-11-23 ENCOUNTER — Ambulatory Visit: Payer: Medicare Other | Admitting: Cardiovascular Disease

## 2021-12-27 NOTE — Progress Notes (Signed)
Chief Complaint  Patient presents with   Follow-up    Aortic valve disease   History of Present Illness: 76 yo female with history of HTN, HLD, DM, sleep apnea, aortic stenosis s/p AVR and carotid artery stenosis who is here today for cardiac follow up. She underwent AVR at [facility] in July  2013 with placement of a 21 mm bioprosthetic valve.  She had been followed at [facility] by Dr. Sharlet Salina until 2018 when she established in our office. Cardiac cath in 2013 with no evidence of CAD. Echo August 2019 with LVEF=55-60%, mild to moderate AS with mean gradient 22 mmHg. Her carotid artery disease is followed in the [facility]. She underwent left carotid endarterectomy in August 2019 per Dr. Donzetta Matters. Echo August 2020 with LVEF=60-65%,mild mitral stenosis and moderate stenosis of the bioprosthetic AVR with mean gradient 34 mmHg. She was scheduled to be seen in September 2020 but missed that appointment. When I saw her in February 2021 she c/o palpitations. Echo March 2021 with LVEF=60-65%, moderate MR, moderate AS with mean gradient of 28 mmHg through the bioprosthetic AVR. Cardiac monitor March 2021 with PACs, PVCs and short runs of SVT.   She is here today for follow up. The patient denies any chest pain, palpitations, lower extremity edema, orthopnea, PND, dizziness, near syncope or syncope. She has noticed some dyspnea.   Primary Care Physician: Steele Sizer, MD  Past Medical History:  Diagnosis Date   Allergy    Arthritis    Asthma    B12 deficiency anemia    Blood transfusion without reported diagnosis    H/O aortic valve replacement 2013   at [place]    History of colon polyps    History of kidney stones    HTN (hypertension)    Hyperlipidemia    Left nephrolithiasis    OSA (obstructive sleep apnea)    STUDY 12 YRS CPAP RX-- BUT NONTOLERANT   Right ureteral stone    S/P aortic valve replacement    2013  AT [facility]--- CARDIOLOGIST--  DR CRAWFORD [facility]   Seasonal allergies    Sleep apnea     wears cpap    Statin intolerance    Type 2 diabetes mellitus ([place])    type 2    Past Surgical History:  Procedure Laterality Date   AORTIC VALVE REPLACEMENT  06/2012   [place]   COLONOSCOPY  05-09-2014   COLONOSCOPY     CYSTOSCOPY/RETROGRADE/URETEROSCOPY Right 07/04/2014   Procedure: CYSTOSCOPY/RETROGRADE/URETEROSCOPY;  Surgeon: Bernestine Amass, MD;  Location: [facility];  Service: Urology;  Laterality: Right;   ENDARTERECTOMY Left 08/05/2018   Procedure: ENDARTERECTOMY CAROTID LEFT;  Surgeon: Waynetta Sandy, MD;  Location: [place];  Service: Vascular;  Laterality: Left;   ESOPHAGOGASTRODUODENOSCOPY     EXTRACORPOREAL SHOCK WAVE LITHOTRIPSY Right 06/ 2015     [place]   HOLMIUM LASER APPLICATION Right 02/29/5572   Procedure: HOLMIUM LASER APPLICATION;  Surgeon: Bernestine Amass, MD;  Location[facility];  Service: Urology;  Laterality: Right;   PATCH ANGIOPLASTY Left 08/05/2018   Procedure: PATCH ANGIOPLASTY USING XENOSURE BIOLOGIC PATCH 1cm x 6cm;  Surgeon: Waynetta Sandy, MD;  Location: [place];  Service: Vascular;  Laterality: Left;   POLYPECTOMY     TONSILLECTOMY  1953   UPPER GASTROINTESTINAL ENDOSCOPY      Current Outpatient Medications  Medication Sig Dispense Refill   albuterol (PROVENTIL) (2.5 MG/3ML) 0.083%  nebulizer solution Take 3 mLs (2.5 mg total) by nebulization every 6 (six) hours. And as needed 180 mL 3   albuterol (VENTOLIN HFA) 108 (90 Base) MCG/ACT inhaler Inhale 2 puffs into the lungs every 6 (six) hours as needed for wheezing or shortness of breath. 18 g 3   Ascorbic Acid (VITAMIN C) 100 MG tablet Take 100 mg by mouth daily.     aspirin EC 81 MG tablet Take 81 mg by mouth daily.     B Complex CAPS Take 1 tablet by mouth 2 (two) times daily.     budesonide (PULMICORT) 0.5 MG/2ML nebulizer solution Take 2 mLs (0.5 mg total) by nebulization in the morning and at bedtime. 60 mL 3    Cholecalciferol (VITAMIN D) 2000 UNITS tablet Take 2,000 Units by mouth every other day.      Coenzyme Q10 (COQ10) 150 MG CAPS Take 150 mg by mouth 2 (two) times daily.     diclofenac sodium (VOLTAREN) 1 % GEL Apply 2 g topically 4 (four) times daily. 1 Tube 0   enalapril (VASOTEC) 2.5 MG tablet Take 1 tablet (2.5 mg total) by mouth daily. 90 tablet 1   Krill Oil 1000 MG CAPS Take 1 capsule by mouth daily.     MAG THREONATE-NIACINAMIDE ER PO Take 800 mg by mouth 2 (two) times daily.     Melatonin 12 MG TABS Take by mouth.     Menatetrenone (VITAMIN K2) 100 MCG TABS Take by mouth. 180 mcg     Moringa 500 MG CAPS Take 1 capsule by mouth daily. 30 capsule 0   Respiratory Therapy Supplies (NEBULIZER AIR TUBE/PLUGS) MISC 1 each by Does not apply route daily. 1 each 2   No current facility-administered medications for this visit.    Allergies  Allergen Reactions   Dilaudid [Hydromorphone Hcl] Other (See Comments)    Severe hypotension - pt states that if she is given this medication it will kill her   Naproxen Shortness Of Breath   Nsaids Shortness Of Breath and Other (See Comments)    Causes asthma   Percocet [Oxycodone-Acetaminophen] Shortness Of Breath    Can tolerate oxy IR   Sulfa Antibiotics Hives, Rash and Shortness Of Breath   Latex Hives   Other Swelling    Eye drop preservative Specific agent unspecified   Rosuvastatin Other (See Comments)    myalgia   Morphine And Related     Pain with IM injection    Social History   Socioeconomic History   Marital status: Single    Spouse name: Not on file   Number of children: 2   Years of education: some college   Highest education level: 12th grade  Occupational History   Occupation: Works for [employer]-retired    Employer: [employer]  Tobacco Use   Smoking status: Never   Smokeless tobacco: Never   Tobacco comments:    smoking cessation materials not required  Vaping Use   Vaping Use: Never used   Substance and Sexual Activity   Alcohol use: No    Alcohol/week: 0.0 standard drinks   Drug use: No   Sexual activity: Not Currently  Other Topics Concern   Not on file  Social History Narrative   Divorced, 2 children. No longer working   Son is out of jail since 03/20/2020    Daughter and grandkids live in [place] alone but keeps grandkids often   Social Determinants of Health  Financial Resource Strain: Low Risk    Difficulty of Paying Living Expenses: Not hard at all  Food Insecurity: No Food Insecurity   Worried About Charity fundraiser in the Last Year: Never true   Ran Out of Food in the Last Year: Never true  Transportation Needs: No Transportation Needs   Lack of Transportation (Medical): No   Lack of Transportation (Non-Medical): No  Physical Activity: Sufficiently Active   Days of Exercise per Week: 5 days   Minutes of Exercise per Session: 50 min  Stress: No Stress Concern Present   Feeling of Stress : Not at all  Social Connections: Moderately Integrated   Frequency of Communication with Friends and Family: More than three times a week   Frequency of Social Gatherings with Friends and Family: More than three times a week   Attends Religious Services: More than 4 times per year   Active Member of [employer] or Organizations: Yes   Attends Music therapist: More than 4 times per year   Marital Status: Divorced  Human resources officer Violence: Not At Risk   Fear of Current or Ex-Partner: No   Emotionally Abused: No   Physically Abused: No   Sexually Abused: No    Family History  Problem Relation Age of Onset   Heart disease Mother    Heart disease Father    Asthma Daughter    Asthma Maternal Grandfather    Heart disease Sister        smoker   Heart attack Sister 74   Kidney cancer Sister    Heart disease Sister        smoker   Heart disease Sister        smoker   Stomach cancer Maternal Grandmother    Colon cancer Neg Hx     Esophageal cancer Neg Hx    Rectal cancer Neg Hx    Colon polyps Neg Hx     Review of Systems:  As stated in the HPI and otherwise negative.   BP 116/70    Pulse 82    Ht 5\' 2"  (1.575 m)    Wt 173 lb 12.8 oz (78.8 kg)    SpO2 97%    BMI 31.79 kg/m   Physical Examination:  General: Well developed, well nourished, NAD  HEENT: OP clear, mucus membranes moist  SKIN: warm, dry. No rashes. Neuro: No focal deficits  Musculoskeletal: Muscle strength 5/5 all ext  Psychiatric: Mood and affect normal  Neck: No JVD, no carotid bruits, no thyromegaly, no lymphadenopathy.  Lungs:Clear bilaterally, no wheezes, rhonci, crackles Cardiovascular: Regular rate and rhythm. Systolic murmur.  Abdomen:Soft. Bowel sounds present. Non-tender.  Extremities: No lower extremity edema. Pulses are 2 + in the bilateral DP/PT.  Echo March 2021:   1. Left ventricular ejection fraction, by estimation, is 60 to 65%. The  left ventricle has normal function. The left ventricle has no regional  wall motion abnormalities. There is mild left ventricular hypertrophy.  Left ventricular diastolic parameters  are consistent with Grade III diastolic dysfunction (restrictive).  Elevated left atrial pressure.   2. Right ventricular systolic function is normal. The right ventricular  size is normal.   3. Left atrial size was mildly dilated.   4. . Moderate mitral valve regurgitation.   5. AV prosthesis (64mm bioprosthesis July 2013) is difficult to see. Peak  and mean gradients through the valve are 49 and 28 mm Hg respectively.  LVOT /AV VTI ratio is 0.22, relatively unchanged  from previous echo in  2020. Marland Kitchen No AI   6. The inferior vena cava is normal in size with greater than 50%  respiratory variability, suggesting right atrial pressure of 3 mmHg.   EKG:  EKG is ordered today. The ekg ordered today demonstrates sinus  Recent Labs: 08/06/2021: ALT 10; BUN 14; Creat 0.60; Hemoglobin 13.5; Platelets 207; Potassium 4.6;  Sodium 139   Lipid Panel    Component Value Date/Time   CHOL 221 (H) 01/15/2021 0000   CHOL 230 (H) 01/21/2020 1025   TRIG 81 01/15/2021 0000   HDL 84 01/15/2021 0000   HDL 78 01/21/2020 1025   CHOLHDL 2.6 01/15/2021 0000   VLDL 24 08/21/2016 1004   LDLCALC 119 (H) 01/15/2021 0000   LDLDIRECT 162.7 06/18/2007 1534     Wt Readings from Last 3 Encounters:  12/28/21 173 lb 12.8 oz (78.8 kg)  08/06/21 162 lb (73.5 kg)  02/02/21 165 lb (74.8 kg)     Other studies Reviewed: Additional studies/ records that were reviewed today include: . Review of the above records demonstrates:   Assessment and Plan:   1. Aortic valve stenosis s/p AVR: She is s/p aortic valve replacement with placement of a 21 mm bioprosthetic AVR at [facility] in 2013. Moderate AS by echo March 2021. Will continue ASA. She should continue to use antibiotic prophylaxis prior to indicated procedures. Repeat echo now.    2. HTN: BP is well controlled. No changes  3. Carotid artery stenosis: s/p left carotid endarterectomy August 2019. Followed in [facility].   4. Hyperlipidemia: She is intolerant of statins. Consider Praluent or Repatha given carotid artery disease but she refuses again.   5. PACs/PVCs/SVT: No recent palpitations.   Current medicines are reviewed at length with the patient today.  The patient does not have concerns regarding medicines.  The following changes have been made:  no change  Labs/ tests ordered today include:   Orders Placed This Encounter  Procedures   EKG 12-Lead   ECHOCARDIOGRAM COMPLETE    Disposition:   FU with me in 12 months  Signed, Lauree Chandler, MD 12/28/2021 1:51 PM    [facility] [address], [place], [place]  [address] Phone: [phone]; Fax: [phone]

## 2021-12-28 ENCOUNTER — Ambulatory Visit (INDEPENDENT_AMBULATORY_CARE_PROVIDER_SITE_OTHER): Payer: Medicare Other | Admitting: Cardiovascular Disease

## 2021-12-28 ENCOUNTER — Encounter: Payer: Self-pay | Admitting: Cardiovascular Disease

## 2021-12-28 ENCOUNTER — Other Ambulatory Visit: Payer: Self-pay

## 2021-12-28 VITALS — BP 116/70 | HR 82 | Ht 62.0 in | Wt 173.8 lb

## 2021-12-28 DIAGNOSIS — I6523 Occlusion and stenosis of bilateral carotid arteries: Secondary | ICD-10-CM

## 2021-12-28 DIAGNOSIS — I471 Supraventricular tachycardia: Secondary | ICD-10-CM

## 2021-12-28 DIAGNOSIS — E785 Hyperlipidemia, unspecified: Secondary | ICD-10-CM

## 2021-12-28 DIAGNOSIS — I350 Nonrheumatic aortic (valve) stenosis: Secondary | ICD-10-CM | POA: Diagnosis not present

## 2021-12-28 DIAGNOSIS — I10 Essential (primary) hypertension: Secondary | ICD-10-CM

## 2021-12-28 NOTE — Patient Instructions (Addendum)
Medication Instructions:  No changes *If you need a refill on your cardiac medications before your next appointment, please call your pharmacy*   Lab Work: none   Testing/Procedures: Your physician has requested that you have an echocardiogram. Echocardiography is a painless test that uses sound waves to create images of your heart. It provides your doctor with information about the size and shape of your heart and how well your hearts chambers and valves are working. This procedure takes approximately one hour. There are no restrictions for this procedure.   Follow-Up: At [facility], you and your health needs are our priority.  As part of our continuing mission to provide you with exceptional heart care, we have created designated Provider Care Teams.  These Care Teams include your primary Cardiologist (physician) and Advanced Practice Providers (APPs -  Physician Assistants and Nurse Practitioners) who all work together to provide you with the care you need, when you need it.   Your next appointment:   12 month(s)  The format for your next appointment:   In Person  Provider:   Lauree Chandler, MD  Other Instructions

## 2022-01-07 ENCOUNTER — Ambulatory Visit: Payer: Medicare Other | Admitting: Cardiovascular Disease

## 2022-01-08 ENCOUNTER — Ambulatory Visit (HOSPITAL_COMMUNITY): Payer: Medicare Other | Attending: Cardiology

## 2022-01-08 ENCOUNTER — Other Ambulatory Visit: Payer: Self-pay

## 2022-01-08 DIAGNOSIS — I471 Supraventricular tachycardia, unspecified: Secondary | ICD-10-CM

## 2022-01-08 DIAGNOSIS — I10 Essential (primary) hypertension: Secondary | ICD-10-CM | POA: Diagnosis not present

## 2022-01-08 DIAGNOSIS — I6523 Occlusion and stenosis of bilateral carotid arteries: Secondary | ICD-10-CM | POA: Diagnosis not present

## 2022-01-08 DIAGNOSIS — E785 Hyperlipidemia, unspecified: Secondary | ICD-10-CM

## 2022-01-08 DIAGNOSIS — I350 Nonrheumatic aortic (valve) stenosis: Secondary | ICD-10-CM

## 2022-01-09 LAB — ECHOCARDIOGRAM COMPLETE
AV Mean grad: 29.8 mmHg
AV Peak grad: 52.2 mmHg
Ao pk vel: 3.61 m/s
Area-P 1/2: 3.19 cm2
MV M vel: 5.6 m/s
MV Peak grad: 125.6 mmHg
S' Lateral: 2.8 cm

## 2022-01-31 ENCOUNTER — Ambulatory Visit: Payer: Medicare Other

## 2022-01-31 DIAGNOSIS — H6123 Impacted cerumen, bilateral: Secondary | ICD-10-CM | POA: Diagnosis not present

## 2022-01-31 DIAGNOSIS — H903 Sensorineural hearing loss, bilateral: Secondary | ICD-10-CM | POA: Diagnosis not present

## 2022-01-31 DIAGNOSIS — H838X3 Other specified diseases of inner ear, bilateral: Secondary | ICD-10-CM | POA: Diagnosis not present

## 2022-02-04 NOTE — Progress Notes (Signed)
Name: Bailey [name]   MRN: [id number]    DOB: 1946-04-08   Date:02/05/2022       Progress Note  Subjective  Chief Complaint  Follow Up  HPI  DMII: A1C today is normal at 5.0 %. She has not been checking her glucose at home but denies polyphagia, polydipsia or polyuria.  She has been physically active She is on  ACE but since no microalbuminuria we will stop medication She has dyslipidemia but refuses medication.  She only believes in fish oil, she has  intolerance to statins . Discussed PKS9 and also Ezetimide in the past  but she is not interested   HTN: taking medication and denies side effects of medication, bp is towards low end of normal, we will stop ACE   Mild intermittent asthma: she has intermittent wheezing and sob , today a mild dry cough. She is not on maintenance medication   Overweight : she is physically active, weight is stable since last visit around 170 lbs    Dyslipidemia: unable to tolerate statin therapy , she is taking fish oil only. She is aware of importance of medication, but she continues to  refuse Zetia of PKS9 injection.    History of carotid endarterectomy left side: rlast visit with Dr. Donzetta Matters was 02/02/2021 and had repeat carotid doppler that showed velocities were stable , advised to follow up in one year    Hearing loss: unchanged    Inflammatory arthritis : she was seeing Rheumatologist and was taking Plaquenil but she stopped taking it on her own. She states her aches and pain are stable, mostly on her hands and wrists. She had [place] of her wrist and it was very expensive and decided not to go back to Rheumatologist. Unchanged    Left upper quadrant pain: intermittently , no nausea or vomiting, she has a history of pancreatitis and asked for labs  Patient Active Problem List   Diagnosis Date Noted   Senile purpura ([place]) 02/05/2022   Dyslipidemia associated with type 2 diabetes mellitus ([place]) 05/17/2020   Recurrent pancreatitis 05/26/2019   Left  carotid artery stenosis 08/05/2018   OSA (obstructive sleep apnea) 01/20/2017   Carotid atherosclerosis, bilateral 01/20/2017   Inflammatory polyarthritis ([place]) 11/12/2015   Erosive osteoarthritis of hand 11/12/2015   History of vitamin D deficiency 11/06/2015   Hearing loss 08/24/2015   Asthma, mild intermittent 08/23/2015   Arthritis, degenerative 08/23/2015   Dyslipidemia 08/23/2015   H/O: hypothyroidism 08/23/2015   Obesity (BMI 30-39.9) 08/23/2015   HZV (herpes zoster virus) post herpetic neuralgia 08/23/2015   History of renal stone 04/01/2014   Diabetes mellitus, controlled ([place]) 04/01/2014   Essential hypertension, benign 04/01/2014   Aortic stenosis 06/16/2012   H/O prosthetic heart valve 06/15/2012    Past Surgical History:  Procedure Laterality Date   AORTIC VALVE REPLACEMENT  06/2012   [place]   CARDIAC VALVE REPLACEMENT  2013   CHOLECYSTECTOMY  1978   COLONOSCOPY  05/09/2014   COLONOSCOPY     CYSTOSCOPY/RETROGRADE/URETEROSCOPY Right 07/04/2014   Procedure: CYSTOSCOPY/RETROGRADE/URETEROSCOPY;  Surgeon: Bernestine Amass, MD;  Location: [facility];  Service: Urology;  Laterality: Right;   ENDARTERECTOMY Left 08/05/2018   Procedure: ENDARTERECTOMY CAROTID LEFT;  Surgeon: Waynetta Sandy, MD;  Location: [place];  Service: Vascular;  Laterality: Left;   ESOPHAGOGASTRODUODENOSCOPY     EXTRACORPOREAL SHOCK WAVE LITHOTRIPSY Right 06/ 2015        EYE SURGERY  cataracts removed   HOLMIUM LASER APPLICATION Right 93/26/7124   Procedure: HOLMIUM LASER APPLICATION;  Surgeon: Bernestine Amass, MD;  Location: [facility];  Service: Urology;  Laterality: Right;   PATCH ANGIOPLASTY Left 08/05/2018   Procedure: PATCH ANGIOPLASTY USING XENOSURE BIOLOGIC PATCH 1cm x 6cm;  Surgeon: Waynetta Sandy, MD;  [facility];  Service: Vascular;  Laterality: Left;   POLYPECTOMY     TONSILLECTOMY  1953   UPPER  GASTROINTESTINAL ENDOSCOPY      Family History  Problem Relation Age of Onset   Heart disease Mother    Heart disease Father    Asthma Daughter    Asthma Maternal Grandfather    Heart disease Sister        smoker   Heart attack Sister 23   Kidney cancer Sister    Cancer Sister    Heart disease Sister        smoker   Heart disease Sister        smoker   Stomach cancer Maternal Grandmother    Arthritis Maternal Grandmother    Cancer Maternal Grandmother    Cancer Maternal Aunt    Colon cancer Neg Hx    Esophageal cancer Neg Hx    Rectal cancer Neg Hx    Colon polyps Neg Hx     Social History   Tobacco Use   Smoking status: Never   Smokeless tobacco: Never   Tobacco comments:    smoking cessation materials not required  Substance Use Topics   Alcohol use: No     Current Outpatient Medications:    albuterol (PROVENTIL) (2.5 MG/3ML) 0.083% nebulizer solution, Take 3 mLs (2.5 mg total) by nebulization every 6 (six) hours. And as needed, Disp: 180 mL, Rfl: 3   albuterol (VENTOLIN HFA) 108 (90 Base) MCG/ACT inhaler, Inhale 2 puffs into the lungs every 6 (six) hours as needed for wheezing or shortness of breath., Disp: 18 g, Rfl: 3   Ascorbic Acid (VITAMIN C) 100 MG tablet, Take 100 mg by mouth daily., Disp: , Rfl:    aspirin EC 81 MG tablet, Take 81 mg by mouth daily., Disp: , Rfl:    B Complex CAPS, Take 1 tablet by mouth 2 (two) times daily., Disp: , Rfl:    budesonide (PULMICORT) 0.5 MG/2ML nebulizer solution, Take 2 mLs (0.5 mg total) by nebulization in the morning and at bedtime., Disp: 60 mL, Rfl: 3   Cholecalciferol (VITAMIN D) 2000 UNITS tablet, Take 2,000 Units by mouth daily., Disp: , Rfl:    Coenzyme Q10 (COQ10) 150 MG CAPS, Take 150 mg by mouth 2 (two) times daily., Disp: , Rfl:    diclofenac sodium (VOLTAREN) 1 % GEL, Apply 2 g topically 4 (four) times daily., Disp: 1 Tube, Rfl: 0   enalapril (VASOTEC) 2.5 MG tablet, Take 1 tablet (2.5 mg total) by mouth daily.,  Disp: 90 tablet, Rfl: 1   Krill Oil 1000 MG CAPS, Take 1 capsule by mouth daily., Disp: , Rfl:    MAG THREONATE-NIACINAMIDE ER PO, Take 800 mg by mouth 2 (two) times daily., Disp: , Rfl:    Melatonin 12 MG TABS, Take by mouth., Disp: , Rfl:    Menatetrenone (VITAMIN K2) 100 MCG TABS, Take by mouth. 180 mcg, Disp: , Rfl:    Moringa 500 MG CAPS, Take 1 capsule by mouth daily., Disp: 30 capsule, Rfl: 0   Respiratory Therapy Supplies (NEBULIZER AIR TUBE/PLUGS) MISC, 1 each by Does not apply route  daily., Disp: 1 each, Rfl: 2  Allergies  Allergen Reactions   Dilaudid [Hydromorphone Hcl] Other (See Comments)    Severe hypotension - pt states that if she is given this medication it will kill her   Naproxen Shortness Of Breath   Nsaids Shortness Of Breath and Other (See Comments)    Causes asthma   Percocet [Oxycodone-Acetaminophen] Shortness Of Breath    Can tolerate oxy IR   Sulfa Antibiotics Hives, Rash and Shortness Of Breath   Latex Hives   Other Swelling    Eye drop preservative Specific agent unspecified   Rosuvastatin Other (See Comments)    myalgia   Morphine And Related     Pain with IM injection    I personally reviewed active problem list, medication list, allergies, family history, social history, health maintenance with the patient/caregiver today.   ROS  Constitutional: Negative for fever or weight change.  Respiratory: Negative for cough and shortness of breath.   Cardiovascular: Negative for chest pain or palpitations.  Gastrointestinal: Negative for abdominal pain, no bowel changes.  Musculoskeletal: Negative for gait problem or joint swelling.  Skin: Negative for rash.  Neurological: Negative for dizziness or headache.  No other specific complaints in a complete review of systems (except as listed in HPI above).   Objective  Vitals:   02/05/22 1037  BP: 104/62  Pulse: 82  Resp: 16  Temp: 97.9 F (36.6 C)  TempSrc: Oral  SpO2: 97%  Weight: 173 lb 11.2 oz  (78.8 kg)  Height: 5\' 2"  (1.575 m)    Body mass index is 31.77 kg/m.  Physical Exam  Constitutional: Patient appears well-developed and well-nourished. Obese  No distress.  HEENT: head atraumatic, normocephalic, pupils equal and reactive to light, neck supple, throat within normal limits Cardiovascular: Normal rate, regular rhythm and normal heart sounds.  2/6  murmur heard. No BLE edema. Pulmonary/Chest: Effort normal and breath sounds normal. No respiratory distress. Abdominal: Soft.  There is no tenderness. Psychiatric: Patient has a normal mood and affect. behavior is normal. Judgment and thought content normal.   Recent Results (from the past 2160 hour(s))  ECHOCARDIOGRAM COMPLETE     Status: None   Collection Time: 01/08/22  2:11 PM  Result Value Ref Range   Area-P 1/2 3.19 cm2   S' Lateral 2.80 cm   Ao pk vel 3.61 m/s   AV Mean grad 29.8 mmHg   AV Peak grad 52.2 mmHg   MV M vel 5.60 m/s   MV Peak grad 125.6 mmHg    PHQ2/9: Depression screen [facility] 2/9 02/05/2022 02/05/2022 08/06/2021 01/30/2021 01/15/2021  Decreased Interest 0 0 0 0 0  Down, Depressed, Hopeless 0 0 0 0 0  PHQ - 2 Score 0 0 0 0 0  Altered sleeping 0 0 - - -  Tired, decreased energy 0 0 - - -  Change in appetite 0 0 - - -  Feeling bad or failure about yourself  0 0 - - -  Trouble concentrating 0 0 - - -  Moving slowly or fidgety/restless 0 0 - - -  Suicidal thoughts 0 0 - - -  PHQ-9 Score 0 0 - - -  Difficult doing work/chores Not difficult at all Not difficult at all - - -  Some recent data might be hidden    phq 9 is negative   Fall Risk: Fall Risk  02/05/2022 02/05/2022 08/06/2021 01/30/2021 01/15/2021  Falls in the past year? 0 0 0 0 0  Comment - - - - -  Number falls in past yr: 0 0 0 0 0  Injury with Fall? 0 0 0 0 0  Risk for fall due to : No Fall Risks No Fall Risks No Fall Risks No Fall Risks -  Risk for fall due to: Comment - - - - -  Follow up Falls prevention discussed Falls prevention  discussed Falls prevention discussed Falls prevention discussed -     Assessment & Plan  1. Dyslipidemia associated with type 2 diabetes mellitus (HCC)  - HgB A1c - Lipid Profile - Urine Microalbumin w/creat. ratio - COMPLETE METABOLIC PANEL WITH GFR  2. Senile purpura (HCC)  Stable and reassurance given   3. Inflammatory polyarthritis ([place])  Stopped going to Rheumatologist   4. Vitamin D deficiency   5. Carotid atherosclerosis, bilateral   6. Hypertension, benign   7. Mild intermittent asthma without complication   8. Bilateral hearing loss, unspecified hearing loss type   9. History of aortic valve replacement   10. OSA (obstructive sleep apnea)   11. History of pancreatitis  - Lipase  12. Abdominal pain, left upper quadrant  - Lipase

## 2022-02-05 ENCOUNTER — Ambulatory Visit (INDEPENDENT_AMBULATORY_CARE_PROVIDER_SITE_OTHER): Payer: Medicare Other

## 2022-02-05 ENCOUNTER — Ambulatory Visit (INDEPENDENT_AMBULATORY_CARE_PROVIDER_SITE_OTHER): Payer: Medicare Other | Admitting: Family Medicine

## 2022-02-05 ENCOUNTER — Encounter: Payer: Self-pay | Admitting: Family Medicine

## 2022-02-05 ENCOUNTER — Other Ambulatory Visit: Payer: Self-pay

## 2022-02-05 VITALS — BP 104/62 | HR 82 | Temp 97.9°F | Resp 16 | Ht 62.0 in | Wt 173.7 lb

## 2022-02-05 DIAGNOSIS — H9193 Unspecified hearing loss, bilateral: Secondary | ICD-10-CM

## 2022-02-05 DIAGNOSIS — E785 Hyperlipidemia, unspecified: Secondary | ICD-10-CM

## 2022-02-05 DIAGNOSIS — J4520 Mild intermittent asthma, uncomplicated: Secondary | ICD-10-CM | POA: Diagnosis not present

## 2022-02-05 DIAGNOSIS — M064 Inflammatory polyarthropathy: Secondary | ICD-10-CM | POA: Diagnosis not present

## 2022-02-05 DIAGNOSIS — I10 Essential (primary) hypertension: Secondary | ICD-10-CM | POA: Diagnosis not present

## 2022-02-05 DIAGNOSIS — E559 Vitamin D deficiency, unspecified: Secondary | ICD-10-CM | POA: Diagnosis not present

## 2022-02-05 DIAGNOSIS — D692 Other nonthrombocytopenic purpura: Secondary | ICD-10-CM | POA: Diagnosis not present

## 2022-02-05 DIAGNOSIS — G4733 Obstructive sleep apnea (adult) (pediatric): Secondary | ICD-10-CM | POA: Diagnosis not present

## 2022-02-05 DIAGNOSIS — Z0000 Encounter for general adult medical examination without abnormal findings: Secondary | ICD-10-CM

## 2022-02-05 DIAGNOSIS — Z8719 Personal history of other diseases of the digestive system: Secondary | ICD-10-CM

## 2022-02-05 DIAGNOSIS — Z952 Presence of prosthetic heart valve: Secondary | ICD-10-CM

## 2022-02-05 DIAGNOSIS — I6523 Occlusion and stenosis of bilateral carotid arteries: Secondary | ICD-10-CM | POA: Diagnosis not present

## 2022-02-05 DIAGNOSIS — E1169 Type 2 diabetes mellitus with other specified complication: Secondary | ICD-10-CM | POA: Diagnosis not present

## 2022-02-05 DIAGNOSIS — Z1231 Encounter for screening mammogram for malignant neoplasm of breast: Secondary | ICD-10-CM | POA: Diagnosis not present

## 2022-02-05 DIAGNOSIS — R1012 Left upper quadrant pain: Secondary | ICD-10-CM

## 2022-02-05 DIAGNOSIS — Z23 Encounter for immunization: Secondary | ICD-10-CM

## 2022-02-05 NOTE — Progress Notes (Signed)
Subjective:   Bailey [name] is a 76 y.o. female who presents for Medicare Annual (Subsequent) preventive examination.  Virtual Visit via Telephone Note  I connected with  Bailey [name] on 02/05/22 at 10:00 AM EST by telephone and verified that I am speaking with the correct person using two identifiers.  Location: Patient: home Provider: [place] Persons participating in the virtual visit: [place]   I discussed the limitations, risks, security and privacy concerns of performing an evaluation and management service by telephone and the availability of in person appointments. The patient expressed understanding and agreed to proceed.  Interactive audio and video telecommunications were attempted between this nurse and patient, however failed, due to patient having technical difficulties OR patient did not have access to video capability.  We continued and completed visit with audio only.  Some vital signs may be absent or patient reported.   [name], LPN   Review of Systems           Objective:    There were no vitals filed for this visit. There is no height or weight on file to calculate BMI.  Advanced Directives 02/05/2022 01/30/2021 01/25/2020 08/28/2018 08/12/2018 08/05/2018 08/04/2018  Does Patient Have a Medical Advance Directive? No No No No No No No  Would patient like information on creating a medical advance directive? No - Patient declined No - Patient declined No - Patient declined No - Patient declined - No - Patient declined No - Patient declined  Pre-existing out of facility DNR order (yellow form or pink MOST form) - - - - - - -    Current Medications (verified) Outpatient Encounter Medications as of 02/05/2022  Medication Sig   albuterol (PROVENTIL) (2.5 MG/3ML) 0.083% nebulizer solution Take 3 mLs (2.5 mg total) by nebulization every 6 (six) hours. And as needed   albuterol (VENTOLIN HFA) 108 (90 Base) MCG/ACT inhaler Inhale 2 puffs  into the lungs every 6 (six) hours as needed for wheezing or shortness of breath.   Ascorbic Acid (VITAMIN C) 100 MG tablet Take 100 mg by mouth daily.   aspirin EC 81 MG tablet Take 81 mg by mouth daily.   B Complex CAPS Take 1 tablet by mouth 2 (two) times daily.   budesonide (PULMICORT) 0.5 MG/2ML nebulizer solution Take 2 mLs (0.5 mg total) by nebulization in the morning and at bedtime.   Cholecalciferol (VITAMIN D) 2000 UNITS tablet Take 2,000 Units by mouth daily.   Coenzyme Q10 (COQ10) 150 MG CAPS Take 150 mg by mouth 2 (two) times daily.   diclofenac sodium (VOLTAREN) 1 % GEL Apply 2 g topically 4 (four) times daily.   enalapril (VASOTEC) 2.5 MG tablet Take 1 tablet (2.5 mg total) by mouth daily.   Krill Oil 1000 MG CAPS Take 1 capsule by mouth daily.   MAG THREONATE-NIACINAMIDE ER PO Take 800 mg by mouth 2 (two) times daily.   Melatonin 12 MG TABS Take by mouth.   Menatetrenone (VITAMIN K2) 100 MCG TABS Take by mouth. 180 mcg   Moringa 500 MG CAPS Take 1 capsule by mouth daily.   Respiratory Therapy Supplies (NEBULIZER AIR TUBE/PLUGS) MISC 1 each by Does not apply route daily.   No facility-administered encounter medications on file as of 02/05/2022.    Allergies (verified) Dilaudid [hydromorphone hcl], Naproxen, Nsaids, Percocet [oxycodone-acetaminophen], Sulfa antibiotics, Latex, Other, Rosuvastatin, and Morphine and related   History: Past Medical History:  Diagnosis Date   Allergy    Arthritis  Asthma    B12 deficiency anemia    Blood transfusion without reported diagnosis    H/O aortic valve replacement 2013   at [place] murmur    History of colon polyps    History of kidney stones    HTN (hypertension)    Hyperlipidemia    Left nephrolithiasis    OSA (obstructive sleep apnea)    STUDY 12 YRS CPAP RX-- BUT NONTOLERANT   Right ureteral stone    S/P aortic valve replacement    2013  AT [facility]--- CARDIOLOGIST--  DR CRAWFORD [facility]   Seasonal allergies     Sleep apnea    wears cpap    Statin intolerance    Type 2 diabetes mellitus ([place])    type 2   Past Surgical History:  Procedure Laterality Date   AORTIC VALVE REPLACEMENT  06/2012   [place] VALVE REPLACEMENT  2013   CHOLECYSTECTOMY  1978   COLONOSCOPY  05/09/2014   COLONOSCOPY     CYSTOSCOPY/RETROGRADE/URETEROSCOPY Right 07/04/2014   Procedure: CYSTOSCOPY/RETROGRADE/URETEROSCOPY;  Surgeon: Bernestine Amass, MD;  Location: [facility];  Service: Urology;  Laterality: Right;   ENDARTERECTOMY Left 08/05/2018   Procedure: ENDARTERECTOMY CAROTID LEFT;  Surgeon: Waynetta Sandy, MD;  Location: [place];  Service: Vascular;  Laterality: Left;   ESOPHAGOGASTRODUODENOSCOPY     EXTRACORPOREAL SHOCK WAVE LITHOTRIPSY Right 06/ 2015     [place]   EYE SURGERY     cataracts removed   HOLMIUM LASER APPLICATION Right 99/35/7017   Procedure: HOLMIUM LASER APPLICATION;  Surgeon: Bernestine Amass, MD;  Location: [facility];  Service: Urology;  Laterality: Right;   PATCH ANGIOPLASTY Left 08/05/2018   Procedure: PATCH ANGIOPLASTY USING XENOSURE BIOLOGIC PATCH 1cm x 6cm;  Surgeon: Waynetta Sandy, MD;  Location: [facility];  Service: Vascular;  Laterality: Left;   POLYPECTOMY     TONSILLECTOMY  1953   UPPER GASTROINTESTINAL ENDOSCOPY     Family History  Problem Relation Age of Onset   Heart disease Mother    Heart disease Father    Asthma Daughter    Asthma Maternal Grandfather    Heart disease Sister        smoker   Heart attack Sister 29   Kidney cancer Sister    Cancer Sister    Heart disease Sister        smoker   Heart disease Sister        smoker   Stomach cancer Maternal Grandmother    Arthritis Maternal Grandmother    Cancer Maternal Grandmother    Cancer Maternal Aunt    Colon cancer Neg Hx    Esophageal cancer Neg Hx    Rectal cancer Neg Hx    Colon polyps Neg Hx    Social History   Socioeconomic History    Marital status: Single    Spouse name: Not on file   Number of children: 2   Years of education: some college   Highest education level: 12th grade  Occupational History   Occupation: Works for [employer]-retired    Employer: [employer]  Tobacco Use   Smoking status: Never   Smokeless tobacco: Never   Tobacco comments:    smoking cessation materials not required  Vaping Use   Vaping Use: Never used  Substance and Sexual Activity   Alcohol use: No   Drug use: No   Sexual activity:  Not Currently  Other Topics Concern   Not on file  Social History Narrative   Divorced, 2 children. No longer working   Son is out of jail since 03/20/2020    Daughter and grandkids live in [place] alone but keeps grandkids often   Social Determinants of Radio broadcast assistant Strain: Low Risk    Difficulty of Paying Living Expenses: Not hard at all  Food Insecurity: No Food Insecurity   Worried About Charity fundraiser in the Last Year: Never true   Arboriculturist in the Last Year: Never true  Transportation Needs: No Transportation Needs   Lack of Transportation (Medical): No   Lack of Transportation (Non-Medical): No  Physical Activity: Sufficiently Active   Days of Exercise per Week: 5 days   Minutes of Exercise per Session: 50 min  Stress: No Stress Concern Present   Feeling of Stress : Not at all  Social Connections: Moderately Integrated   Frequency of Communication with Friends and Family: More than three times a week   Frequency of Social Gatherings with Friends and Family: More than three times a week   Attends Religious Services: More than 4 times per year   Active Member of [employer] or Organizations: Yes   Attends Music therapist: More than 4 times per year   Marital Status: Divorced    Tobacco Counseling Counseling given: Not Answered Tobacco comments: smoking cessation materials not required   Clinical Intake:  Pre-visit  preparation completed: Yes  Pain : No/denies pain     Nutritional Risks: None Diabetes: Yes CBG done?: No Did pt. bring in CBG monitor from home?: No  How often do you need to have someone help you when you read instructions, pamphlets, or other written materials from your doctor or pharmacy?: 1 - Never  Nutrition Risk Assessment:  Has the patient had any N/V/D within the last 2 months?  No  Does the patient have any non-healing wounds?  No  Has the patient had any unintentional weight loss or weight gain?  No   Diabetes:  Is the patient diabetic?  Yes  If diabetic, was a CBG obtained today?  No  Did the patient bring in their glucometer from home?  No  How often do you monitor your CBG's? Pt does not actively check blood sugar; well controlled.   Financial Strains and Diabetes Management:  Are you having any financial strains with the device, your supplies or your medication? No .  Does the patient want to be seen by Chronic Care Management for management of their diabetes?  No  Would the patient like to be referred to a Nutritionist or for Diabetic Management?  No   Diabetic Exams:  Diabetic Eye Exam: Completed 04/16/21.   Diabetic Foot Exam: Completed 08/06/21.     Interpreter Needed?: No  Information entered by :: [name] LPN   Activities of Daily Living In your present state of health, do you have any difficulty performing the following activities: 08/06/2021  Hearing? Y  Vision? N  Difficulty concentrating or making decisions? N  Walking or climbing stairs? N  Dressing or bathing? N  Doing errands, shopping? N  Some recent data might be hidden    Patient Care Team: Steele Sizer, MD as PCP - General (Family Medicine) Leta Baptist, MD as Consulting Physician (Otolaryngology) Katy Apo, MD as Consulting Physician (Ophthalmology) [name], MD as Consulting Physician (Cardiology)  Indicate  any recent Medical Services you may have  received from other than [facility] providers in the past year (date may be approximate).     Assessment:   This is a routine wellness examination for [place].  Hearing/Vision screen Hearing Screening - Comments:: Pt wears hearing aids Vision Screening - Comments:: Vision screenings with Dr. Katy Apo   Dietary issues and exercise activities discussed:     Goals Addressed             This Visit's Progress    DIET - INCREASE WATER INTAKE   On track    Recommend to drink at least 6-8 8oz glasses of water per day.       Depression Screen PHQ 2/9 Scores 02/05/2022 08/06/2021 01/30/2021 01/15/2021 12/28/2020 05/17/2020 01/25/2020  PHQ - 2 Score 0 0 0 0 0 0 0  PHQ- 9 Score 0 - - - - 0 -    Fall Risk Fall Risk  02/05/2022 08/06/2021 01/30/2021 01/15/2021 12/28/2020  Falls in the past year? 0 0 0 0 0  Comment - - - - -  Number falls in past yr: 0 0 0 0 0  Injury with Fall? 0 0 0 0 0  Risk for fall due to : No Fall Risks No Fall Risks No Fall Risks - -  Risk for fall due to: Comment - - - - -  Follow up Falls prevention discussed Falls prevention discussed Falls prevention discussed - Falls evaluation completed    FALL RISK PREVENTION PERTAINING TO THE HOME:  Any stairs in or around the home? Yes  If so, are there any without handrails? No  Home free of loose throw rugs in walkways, pet beds, electrical cords, etc? Yes  Adequate lighting in your home to reduce risk of falls? Yes   ASSISTIVE DEVICES UTILIZED TO PREVENT FALLS:  Life alert? No  Use of a cane, walker or w/c? No  Grab bars in the bathroom? No  Shower chair or bench in shower? No  Elevated toilet seat or a handicapped toilet? No   TIMED UP AND GO:  Was the test performed? No . Telephonic visit  Cognitive Function: Normal cognitive status assessed by direct observation by this Nurse Health Advisor. No abnormalities found.          Immunizations Immunization History  Administered Date(s) Administered   Fluad  Quad(high Dose 65+) 11/21/2021   PFIZER(Purple Top)SARS-COV-2 Vaccination 02/07/2020, 03/13/2020   Pneumococcal Conjugate-13 01/06/2014   Pneumococcal Polysaccharide-23 04/05/2010, 08/24/2015   Tdap 12/12/2006   Zoster, Live 01/06/2014    TDAP status: Due, Education has been provided regarding the importance of this vaccine. Advised may receive this vaccine at local pharmacy or Health Dept. Aware to provide a copy of the vaccination record if obtained from local pharmacy or Health Dept. Verbalized acceptance and understanding.  Flu Vaccine status: Up to date  Pneumococcal vaccine status: Up to date  Covid-19 vaccine status: Completed vaccines  Qualifies for Shingles Vaccine? Yes   Zostavax completed Yes   Shingrix Completed?: No.    Education has been provided regarding the importance of this vaccine. Patient has been advised to call insurance company to determine out of pocket expense if they have not yet received this vaccine. Advised may also receive vaccine at local pharmacy or Health Dept. Verbalized acceptance and understanding.  Screening Tests Health Maintenance  Topic Date Due   Zoster Vaccines- Shingrix (1 of 2) Never done   TETANUS/TDAP  12/12/2016   MAMMOGRAM  04/20/2021  HEMOGLOBIN A1C  02/05/2022   COVID-19 Vaccine (3 - Pfizer risk series) 02/21/2022 (Originally 04/10/2020)   OPHTHALMOLOGY EXAM  04/16/2022   FOOT EXAM  08/06/2022   COLONOSCOPY (Pts 45-77yrs Insurance coverage will need to be confirmed)  01/02/2024   Pneumonia Vaccine 47+ Years old  Completed   INFLUENZA VACCINE  Completed   DEXA SCAN  Completed   Hepatitis C Screening  Completed   HPV VACCINES  Aged Out    Health Maintenance  Health Maintenance Due  Topic Date Due   Zoster Vaccines- Shingrix (1 of 2) Never done   TETANUS/TDAP  12/12/2016   MAMMOGRAM  04/20/2021   HEMOGLOBIN A1C  02/05/2022    Colorectal cancer screening: Type of screening: Colonoscopy. Completed 01/01/19. Repeat every 5  years  Mammogram status: Completed 04/20/20. Repeat every year. Ordered today.   Bone Density status: Completed 04/20/20. Results reflect: Bone density results: OSTEOPENIA. Repeat every 2 years.  Lung Cancer Screening: (Low Dose CT Chest recommended if Age 63-80 years, 30 pack-year currently smoking OR have quit w/in 15years.) does not qualify.   Additional Screening:  Hepatitis C Screening: does qualify; Completed 09/24/12  Vision Screening: Recommended annual ophthalmology exams for early detection of glaucoma and other disorders of the eye. Is the patient up to date with their annual eye exam?  Yes  Who is the provider or what is the name of the office in which the patient attends annual eye exams? Dr. Prudencio Burly.   Dental Screening: Recommended annual dental exams for proper oral hygiene  Community Resource Referral / Chronic Care Management: CRR required this visit?  No   CCM required this visit?  No      Plan:     I have personally reviewed and noted the following in the patients chart:   Medical and social history Use of alcohol, tobacco or illicit drugs  Current medications and supplements including opioid prescriptions.  Functional ability and status Nutritional status Physical activity Advanced directives List of other physicians Hospitalizations, surgeries, and ER visits in previous 12 months Vitals Screenings to include cognitive, depression, and falls Referrals and appointments  In addition, I have reviewed and discussed with patient certain preventive protocols, quality metrics, and best practice recommendations. A written personalized care plan for preventive services as well as general preventive health recommendations were provided to patient.     [name], LPN   [id number]/[id number]   Nurse Notes: pt to see Dr. Ancil Boozer today at 10:40. Pt concerned about A1c due to loss of her best friend in December and being a caretaker prior to that. Pt still grieving but doing  okay. Pt also requests vit d level to be checked with routine lab work

## 2022-02-05 NOTE — Patient Instructions (Signed)
Bailey [name] , Thank you for taking time to come for your Medicare Wellness Visit. I appreciate your ongoing commitment to your health goals. Please review the following plan we discussed and let me know if I can assist you in the future.   Screening recommendations/referrals: Colonoscopy: done 01/01/19. Repeat 12/2023 Mammogram: done 04/20/20. Please call [facility] to schedule your mammogram Bone Density: done 04/20/20 Recommended yearly ophthalmology/optometry visit for glaucoma screening and checkup Recommended yearly dental visit for hygiene and checkup  Vaccinations: Influenza vaccine: done 11/21/21 Pneumococcal vaccine: done 08/24/15 Tdap vaccine: due Shingles vaccine: Shingrix discussed. Please contact your pharmacy for coverage information.  Covid-19: done 02/07/20 & 03/13/20  Advanced directives: Advance directive discussed with you today. Even though you declined this today please call our office should you change your mind and we can give you the proper paperwork for you to fill out.   Conditions/risks identified: Keep up the great work!  Next appointment: Follow up in one year for your annual wellness visit    Preventive Care 65 Years and Older, Female Preventive care refers to lifestyle choices and visits with your health care provider that can promote health and wellness. What does preventive care include? A yearly physical exam. This is also called an annual well check. Dental exams once or twice a year. Routine eye exams. Ask your health care provider how often you should have your eyes checked. Personal lifestyle choices, including: Daily care of your teeth and gums. Regular physical activity. Eating a healthy diet. Avoiding tobacco and drug use. Limiting alcohol use. Practicing safe sex. Taking low-dose aspirin every day. Taking vitamin and mineral supplements as recommended by your health care provider. What happens during an annual well  check? The services and screenings done by your health care provider during your annual well check will depend on your age, overall health, lifestyle risk factors, and family history of disease. Counseling  Your health care provider may ask you questions about your: Alcohol use. Tobacco use. Drug use. Emotional well-being. Home and relationship well-being. Sexual activity. Eating habits. History of falls. Memory and ability to understand (cognition). Work and work Statistician. Reproductive health. Screening  You may have the following tests or measurements: Height, weight, and BMI. Blood pressure. Lipid and cholesterol levels. These may be checked every 5 years, or more frequently if you are over 17 years old. Skin check. Lung cancer screening. You may have this screening every year starting at age 13 if you have a 30-pack-year history of smoking and currently smoke or have quit within the past 15 years. Fecal occult blood test (FOBT) of the stool. You may have this test every year starting at age 29. Flexible sigmoidoscopy or colonoscopy. You may have a sigmoidoscopy every 5 years or a colonoscopy every 10 years starting at age 83. Hepatitis C blood test. Hepatitis B blood test. Sexually transmitted disease (STD) testing. Diabetes screening. This is done by checking your blood sugar (glucose) after you have not eaten for a while (fasting). You may have this done every 1-3 years. Bone density scan. This is done to screen for osteoporosis. You may have this done starting at age 30. Mammogram. This may be done every 1-2 years. Talk to your health care provider about how often you should have regular mammograms. Talk with your health care provider about your test results, treatment options, and if necessary, the need for more tests. Vaccines  Your health care provider may recommend certain vaccines, such as:  Influenza vaccine. This is recommended every year. Tetanus, diphtheria, and  acellular pertussis (Tdap, Td) vaccine. You may need a Td booster every 10 years. Zoster vaccine. You may need this after age 46. Pneumococcal 13-valent conjugate (PCV13) vaccine. One dose is recommended after age 53. Pneumococcal polysaccharide (PPSV23) vaccine. One dose is recommended after age 16. Talk to your health care provider about which screenings and vaccines you need and how often you need them. This information is not intended to replace advice given to you by your health care provider. Make sure you discuss any questions you have with your health care provider. Document Released: 12/22/2015 Document Revised: 08/14/2016 Document Reviewed: 09/26/2015 Elsevier Interactive Patient Education  2017 [place] Prevention in the Home Falls can cause injuries. They can happen to people of all ages. There are many things you can do to make your home safe and to help prevent falls. What can I do on the outside of my home? Regularly fix the edges of walkways and driveways and fix any cracks. Remove anything that might make you trip as you walk through a door, such as a raised step or threshold. Trim any bushes or trees on the path to your home. Use bright outdoor lighting. Clear any walking paths of anything that might make someone trip, such as rocks or tools. Regularly check to see if handrails are loose or broken. Make sure that both sides of any steps have handrails. Any raised decks and porches should have guardrails on the edges. Have any leaves, snow, or ice cleared regularly. Use sand or salt on walking paths during winter. Clean up any spills in your garage right away. This includes oil or grease spills. What can I do in the bathroom? Use night lights. Install grab bars by the toilet and in the tub and shower. Do not use towel bars as grab bars. Use non-skid mats or decals in the tub or shower. If you need to sit down in the shower, use a plastic, non-slip stool. Keep  the floor dry. Clean up any water that spills on the floor as soon as it happens. Remove soap buildup in the tub or shower regularly. Attach bath mats securely with double-sided non-slip rug tape. Do not have throw rugs and other things on the floor that can make you trip. What can I do in the bedroom? Use night lights. Make sure that you have a light by your bed that is easy to reach. Do not use any sheets or blankets that are too big for your bed. They should not hang down onto the floor. Have a firm chair that has side arms. You can use this for support while you get dressed. Do not have throw rugs and other things on the floor that can make you trip. What can I do in the kitchen? Clean up any spills right away. Avoid walking on wet floors. Keep items that you use a lot in easy-to-reach places. If you need to reach something above you, use a strong step stool that has a grab bar. Keep electrical cords out of the way. Do not use floor polish or wax that makes floors slippery. If you must use wax, use non-skid floor wax. Do not have throw rugs and other things on the floor that can make you trip. What can I do with my stairs? Do not leave any items on the stairs. Make sure that there are handrails on both sides of the stairs and use them.  Fix handrails that are broken or loose. Make sure that handrails are as long as the stairways. Check any carpeting to make sure that it is firmly attached to the stairs. Fix any carpet that is loose or worn. Avoid having throw rugs at the top or bottom of the stairs. If you do have throw rugs, attach them to the floor with carpet tape. Make sure that you have a light switch at the top of the stairs and the bottom of the stairs. If you do not have them, ask someone to add them for you. What else can I do to help prevent falls? Wear shoes that: Do not have high heels. Have rubber bottoms. Are comfortable and fit you well. Are closed at the toe. Do not  wear sandals. If you use a stepladder: Make sure that it is fully opened. Do not climb a closed stepladder. Make sure that both sides of the stepladder are locked into place. Ask someone to hold it for you, if possible. Clearly mark and make sure that you can see: Any grab bars or handrails. First and last steps. Where the edge of each step is. Use tools that help you move around (mobility aids) if they are needed. These include: Canes. Walkers. Scooters. Crutches. Turn on the lights when you go into a dark area. Replace any light bulbs as soon as they burn out. Set up your furniture so you have a clear path. Avoid moving your furniture around. If any of your floors are uneven, fix them. If there are any pets around you, be aware of where they are. Review your medicines with your doctor. Some medicines can make you feel dizzy. This can increase your chance of falling. Ask your doctor what other things that you can do to help prevent falls. This information is not intended to replace advice given to you by your health care provider. Make sure you discuss any questions you have with your health care provider. Document Released: 09/21/2009 Document Revised: 05/02/2016 Document Reviewed: 12/30/2014 Elsevier Interactive Patient Education  2017 [employer].

## 2022-02-06 LAB — COMPLETE METABOLIC PANEL WITH GFR
AG Ratio: 1.6 (calc) (ref 1.0–2.5)
ALT: 10 U/L (ref 6–29)
AST: 17 U/L (ref 10–35)
Albumin: 4.4 g/dL (ref 3.6–5.1)
Alkaline phosphatase (APISO): 44 U/L (ref 37–153)
BUN: 16 mg/dL (ref 7–25)
CO2: 29 mmol/L (ref 20–32)
Calcium: 9.9 mg/dL (ref 8.6–10.4)
Chloride: 101 mmol/L (ref 98–110)
Creat: 0.79 mg/dL (ref 0.60–1.00)
Globulin: 2.7 g/dL (calc) (ref 1.9–3.7)
Glucose, Bld: 102 mg/dL — ABNORMAL HIGH (ref 65–99)
Potassium: 4.3 mmol/L (ref 3.5–5.3)
Sodium: 138 mmol/L (ref 135–146)
Total Bilirubin: 0.6 mg/dL (ref 0.2–1.2)
Total Protein: 7.1 g/dL (ref 6.1–8.1)
eGFR: 78 mL/min/{1.73_m2} (ref >=60)

## 2022-02-06 LAB — LIPASE: Lipase: 20 U/L (ref 7–60)

## 2022-02-06 LAB — HEMOGLOBIN A1C
Hgb A1c MFr Bld: 5.3 % of total Hgb (ref <5.7)
Mean Plasma Glucose: 105 mg/dL
eAG (mmol/L): 5.8 mmol/L

## 2022-02-06 LAB — LIPID PANEL
Cholesterol: 219 mg/dL — ABNORMAL HIGH (ref <200)
HDL: 71 mg/dL (ref >=50)
LDL Cholesterol (Calc): 125 mg/dL (calc) — ABNORMAL HIGH
Non-HDL Cholesterol (Calc): 148 mg/dL (calc) — ABNORMAL HIGH (ref <130)
Total CHOL/HDL Ratio: 3.1 (calc) (ref <5.0)
Triglycerides: 122 mg/dL (ref <150)

## 2022-02-06 LAB — VITAMIN D 25 HYDROXY (VIT D DEFICIENCY, FRACTURES): Vit D, 25-Hydroxy: 73 ng/mL (ref 30–100)

## 2022-02-06 LAB — MICROALBUMIN / CREATININE URINE RATIO
Creatinine, Urine: 122 mg/dL (ref 20–275)
Microalb Creat Ratio: 3 mcg/mg creat (ref <30)
Microalb, Ur: 0.4 mg/dL

## 2022-03-05 ENCOUNTER — Ambulatory Visit (INDEPENDENT_AMBULATORY_CARE_PROVIDER_SITE_OTHER): Payer: Medicare Other

## 2022-03-05 ENCOUNTER — Encounter (HOSPITAL_COMMUNITY): Payer: Self-pay | Admitting: Emergency Medicine

## 2022-03-05 ENCOUNTER — Ambulatory Visit (HOSPITAL_COMMUNITY)
Admission: EM | Admit: 2022-03-05 | Discharge: 2022-03-05 | Disposition: A | Discharge status: Home / Self Care | Payer: Medicare Other | Attending: Family Medicine | Admitting: Family Medicine

## 2022-03-05 DIAGNOSIS — M79674 Pain in right toe(s): Secondary | ICD-10-CM | POA: Diagnosis not present

## 2022-03-05 DIAGNOSIS — M109 Gout, unspecified: Secondary | ICD-10-CM | POA: Diagnosis not present

## 2022-03-05 DIAGNOSIS — M7731 Calcaneal spur, right foot: Secondary | ICD-10-CM | POA: Diagnosis not present

## 2022-03-05 DIAGNOSIS — M19071 Primary osteoarthritis, right ankle and foot: Secondary | ICD-10-CM | POA: Diagnosis not present

## 2022-03-05 DIAGNOSIS — M7989 Other specified soft tissue disorders: Secondary | ICD-10-CM | POA: Diagnosis not present

## 2022-03-05 MED ORDER — PREDNISONE 50 MG PO TABS: 50.0000 mg | ORAL_TABLET | Freq: Every day | ORAL | 0 refills | Status: AC

## 2022-03-05 MED ORDER — COLCHICINE 0.6 MG PO TABS: ORAL_TABLET | ORAL | 0 refills | Status: AC

## 2022-03-05 MED ORDER — OXYCODONE HCL 5 MG PO TABS: 5.0000 mg | ORAL_TABLET | ORAL | 0 refills | Status: AC | PRN

## 2022-03-05 NOTE — ED Provider Notes (Signed)
?[facility] ? ? ? ?CSN: [id number] ?Arrival date & time: 03/05/22  1304 ? ? ?  ? ?History   ?Chief Complaint ?Chief Complaint  ?Patient presents with  ? Foot Pain  ? ? ?HPI ?Bailey [name] is a 76 y.o. female.  ? ?Patient is here for right foot pain.  ?Started over the weekend, middle of the night.  ?Primarily at the right big toe joint;  ?Today pain and redness across the top of the foot.  ?History of gout.  She does not have mediation at home for this.  ?This has not been this bad before. Last attack was 10 years ago. ?She has allergy to nsaids (asthma).   ? ?Past Medical History:  ?Diagnosis Date  ? Allergy   ? Arthritis   ? Asthma   ? B12 deficiency anemia   ? Blood transfusion without reported diagnosis   ? H/O aortic valve replacement 2013  ? at [facility]   ? Heart murmur   ? History of colon polyps   ? History of kidney stones   ? HTN (hypertension)   ? Hyperlipidemia   ? Left nephrolithiasis   ? OSA (obstructive sleep apnea)   ? STUDY 12 YRS CPAP RX-- BUT NONTOLERANT  ? Right ureteral stone   ? S/P aortic valve replacement   ? 2013  AT [facility]--- CARDIOLOGIST--  DR CRAWFORD AT [facility]  ? Seasonal allergies   ? Sleep apnea   ? wears cpap   ? Statin intolerance   ? Type 2 diabetes mellitus ([place])   ? type 2  ? ? ?Patient Active Problem List  ? Diagnosis Date Noted  ? Senile purpura ([place]) 02/05/2022  ? Dyslipidemia associated with type 2 diabetes mellitus ([place]) 05/17/2020  ? Recurrent pancreatitis 05/26/2019  ? Left carotid artery stenosis 08/05/2018  ? OSA (obstructive sleep apnea) 01/20/2017  ? Carotid atherosclerosis, bilateral 01/20/2017  ? 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 History of vitamin D deficiency 11/06/2015  ? Hearing loss 08/24/2015  ? Asthma, mild intermittent 08/23/2015  ? Arthritis, degenerative 08/23/2015  ? Dyslipidemia 08/23/2015  ? H/O: hypothyroidism 08/23/2015  ? Obesity (BMI 30-39.9) 08/23/2015  ? HZV (herpes zoster virus) post  herpetic neuralgia 08/23/2015  ? History of renal stone 04/01/2014  ? Diabetes mellitus, controlled ([place]) 04/01/2014  ? Essential hypertension, benign 04/01/2014  ? Aortic stenosis 06/16/2012  ? H/O prosthetic heart valve 06/15/2012  ? ? ?Past Surgical History:  ?Procedure Laterality Date  ? AORTIC VALVE REPLACEMENT  06/2012   [facility]  ? APPENDECTOMY  1958  ? CARDIAC VALVE REPLACEMENT  2013  ? CHOLECYSTECTOMY  1978  ? COLONOSCOPY  05/09/2014  ? COLONOSCOPY    ? CYSTOSCOPY/RETROGRADE/URETEROSCOPY Right 07/04/2014  ? Procedure: CYSTOSCOPY/RETROGRADE/URETEROSCOPY;  Surgeon: Bernestine Amass, MD;  Location: [facility];  Service: Urology;  Laterality: Right;  ? ENDARTERECTOMY Left 08/05/2018  ? Procedure: ENDARTERECTOMY CAROTID LEFT;  Surgeon: Waynetta Sandy, MD;  Location: [facility];  Service: Vascular;  Laterality: Left;  ? ESOPHAGOGASTRODUODENOSCOPY    ? EXTRACORPOREAL SHOCK WAVE LITHOTRIPSY Right 06/ 2015     [place]  ? EYE SURGERY    ? cataracts removed  ? HOLMIUM LASER APPLICATION Right 90/30/0923  ? Procedure: HOLMIUM LASER APPLICATION;  Surgeon: Bernestine Amass, MD;  Location: [facility];  Service: Urology;  Laterality: Right;  ? PATCH ANGIOPLASTY Left 08/05/2018  ? Procedure: PATCH ANGIOPLASTY USING XENOSURE BIOLOGIC PATCH 1cm x 6cm;  Surgeon: Waynetta Sandy, MD;  Location: [facility];  Service: Vascular;  Laterality: Left;  ? POLYPECTOMY    ? TONSILLECTOMY  1953  ? UPPER GASTROINTESTINAL ENDOSCOPY    ? ? ?OB History   ?No obstetric history on file. ?  ? ? ? ?Home Medications   ? ?Prior to Admission medications   ?Medication Sig Start Date End Date Taking? Authorizing Provider  ?albuterol (PROVENTIL) (2.5 MG/3ML) 0.083% nebulizer solution Take 3 mLs (2.5 mg total) by nebulization every 6 (six) hours. And as needed 02/02/21   Chesley Mires, MD  ?albuterol (VENTOLIN HFA) 108 (90 Base) MCG/ACT inhaler Inhale 2 puffs into the lungs every 6 (six) hours as needed for wheezing or  shortness of breath. 01/29/21   Spero Geralds, MD  ?Ascorbic Acid (VITAMIN C) 100 MG tablet Take 100 mg by mouth daily.    [provider]  ?aspirin EC 81 MG tablet Take 81 mg by mouth daily.    [provider]  ?B Complex CAPS Take 1 tablet by mouth 2 (two) times daily.    [provider]  ?budesonide (PULMICORT) 0.5 MG/2ML nebulizer solution Take 2 mLs (0.5 mg total) by nebulization in the morning and at bedtime. 01/30/21   Spero Geralds, MD  ?Cholecalciferol (VITAMIN D) 2000 UNITS tablet Take 2,000 Units by mouth daily.    [provider]  ?Coenzyme Q10 (COQ10) 150 MG CAPS Take 150 mg by mouth 2 (two) times daily.    [provider]  ?diclofenac sodium (VOLTAREN) 1 % GEL Apply 2 g topically 4 (four) times daily. 07/10/18   Ok Edwards, PA-C  ?Krill Oil 1000 MG CAPS Take 1 capsule by mouth daily.    [provider]  ?MAG THREONATE-NIACINAMIDE ER PO Take 800 mg by mouth 2 (two) times daily.    [provider]  ?Melatonin 12 MG TABS Take by mouth.    [provider]  ?Menatetrenone (VITAMIN K2) 100 MCG TABS Take by mouth. 180 mcg    [provider]  ?Moringa 500 MG CAPS Take 1 capsule by mouth daily. 05/17/20   Steele Sizer, MD  ?Respiratory Therapy Supplies (NEBULIZER AIR TUBE/PLUGS) MISC 1 each by Does not apply route daily. 01/06/20   Steele Sizer, MD  ? ? ?Family History ?Family History  ?Problem Relation Age of Onset  ? Heart disease Mother   ? Heart disease Father   ? Asthma Daughter   ? Asthma Maternal Grandfather   ? Heart disease Sister   ?     smoker  ? Heart attack Sister 63  ? Kidney cancer Sister   ? Cancer Sister   ? Heart disease Sister   ?     smoker  ? Heart disease Sister   ?     smoker  ? Stomach cancer Maternal Grandmother   ? Arthritis Maternal Grandmother   ? Cancer Maternal Grandmother   ? Cancer Maternal Aunt   ? Colon cancer Neg Hx   ? Esophageal cancer Neg Hx   ? Rectal cancer Neg Hx   ? Colon polyps Neg Hx    ? ? ?Social History ?Social History  ? ?Tobacco Use  ? Smoking status: Never  ? Smokeless tobacco: Never  ? Tobacco comments:  ?  smoking cessation materials not required  ?Vaping Use  ? Vaping Use: Never used  ?Substance Use Topics  ? Alcohol use: No  ? Drug use: No  ? ? ? ?Allergies   ?Dilaudid [  hydromorphone hcl], Naproxen, Nsaids, Percocet [oxycodone-acetaminophen], Sulfa antibiotics, Latex, Other, Rosuvastatin, and Morphine and related ? ? ?Review of Systems ?Review of Systems  ?Constitutional: Negative.   ?HENT: Negative.    ?Respiratory: Negative.    ?Cardiovascular: Negative.   ?Gastrointestinal: Negative.   ?Musculoskeletal:  Positive for joint swelling.  ? ? ?Physical Exam ?Triage Vital Signs ?ED Triage Vitals  ?Enc Vitals Group  ?   BP 03/05/22 1324 120/73  ?   Pulse Rate 03/05/22 1324 73  ?   Resp 03/05/22 1324 18  ?   Temp 03/05/22 1324 98.1 ?F (36.7 ?C)  ?   Temp Source 03/05/22 1324 Oral  ?   SpO2 03/05/22 1324 96 %  ?   Weight --   ?   Height --   ?   Head Circumference --   ?   Peak Flow --   ?   Pain Score 03/05/22 1322 10  ?   Pain Loc --   ?   Pain Edu? --   ?   Excl. in [place]? --   ? ?No data found. ? ?Updated Vital Signs ?BP 120/73   Pulse 73   Temp 98.1 ?F (36.7 ?C) (Oral)   Resp 18   SpO2 96%  ? ?Visual Acuity ?Right Eye Distance:   ?Left Eye Distance:   ?Bilateral Distance:   ? ?Right Eye Near:   ?Left Eye Near:    ?Bilateral Near:    ? ?Physical Exam ?Constitutional:   ?   Appearance: Normal appearance.  ?Musculoskeletal:  ?   Comments: The right foot is red and swollen, primarily over the MTP joint and across the top of the foot.  Very tender to light touch at the MTP joint;  unable to walk/bear weight  ?Neurological:  ?   Mental Status: She is alert.  ? ? ? ?UC Treatments / Results  ?Labs ?(all labs ordered are listed, but only abnormal results are displayed) ?Labs Reviewed - No data to display ? ?EKG ? ? ?Radiology ?DG Toe Great Right ? ?Result Date: 03/05/2022 ?CLINICAL DATA:  Pain  EXAM: RIGHT GREAT TOE COMPARISON:  None. FINDINGS: No fracture or dislocation is seen. Small bony spurs seen in first metatarsophalangeal joint. There is soft tissue swelling along the medial aspect of first met

## 2022-03-05 NOTE — ED Triage Notes (Signed)
Pt is present today with right foot pain. Pt states that she noticed it Saturday.  ?

## 2022-03-05 NOTE — Discharge Instructions (Addendum)
You were seen today for gout.  ?I have sent out a medication called colchicine.  Take 2 and once and then 1 tab 1 hr later.  ?I have also sent out prednisone '50mg'$  daily x 5 days, and oxycodone '5mg'$  every 4hrs x 4 pills.  Please take when you are home and not driving.  ?Please follow up with your primary care provider if you have further issues.  ?

## 2022-03-15 ENCOUNTER — Ambulatory Visit: Payer: Self-pay

## 2022-03-15 NOTE — Telephone Encounter (Signed)
?  Chief Complaint: gout flare up ?Symptoms: R foot pain and red-purple discoloring, hobbling to walk ?Frequency: 10 days ?Pertinent Negatives: NA ?Disposition: '[]'$ ED /'[]'$ Urgent Care (no appt availability in office) / '[x]'$ Appointment(In office/virtual)/ '[]'$  [facility]/ '[]'$ Home Care/ '[]'$ Refused Recommended Disposition /'[]'$ [facility]/ '[]'$  Follow-up with PCP ?Additional Notes: Pt states that she went to UC on 03/05/22, took the rx that was given to her and the R foot hasn't gotten worse or better in 3 days but did want to get it looked at again. Offered VUC visit for today but pt refused. She preferred to wait until Monday 03/18/22.  ? ?Summary: Gout  ? Pt  has gout flair up / it went from being red to deep purple and a little painful / pt wanted to know if she should give it more time / located on right foot / pt was seen in UC over  a week ago for this /please advise   ?  ? ?Reason for Disposition ? [1] Redness of the skin AND [2] no fever ? ?Answer Assessment - Initial Assessment Questions ?1. ONSET: "When did the pain start?"  ?    10 days ago  ?2. LOCATION: "Where is the pain located?"  ?    R foot ?3. PAIN: "How bad is the pain?"    (Scale 1-10; or mild, moderate, severe) ? - MILD (1-3): doesn't interfere with normal activities.  ? - MODERATE (4-7): interferes with normal activities (e.g., work or school) or awakens from sleep, limping.  ? - SEVERE (8-10): excruciating pain, unable to do any normal activities, unable to walk.  ?    *No Answer* ?5. CAUSE: "What do you think is causing the foot pain?" ?    gout ?6. OTHER SYMPTOMS: "Do you have any other symptoms?" (e.g., leg pain, rash, fever, numbness) ?    Unable to walk on R foot, and deep purple ? ?Protocols used: Foot Pain-A-AH ? ?

## 2022-03-18 ENCOUNTER — Ambulatory Visit: Payer: PRIVATE HEALTH INSURANCE | Admitting: Family Medicine

## 2022-04-16 ENCOUNTER — Ambulatory Visit: Payer: Medicare Other

## 2022-05-01 DIAGNOSIS — H6123 Impacted cerumen, bilateral: Secondary | ICD-10-CM | POA: Diagnosis not present

## 2022-05-13 ENCOUNTER — Ambulatory Visit
Admission: RE | Admit: 2022-05-13 | Discharge: 2022-05-13 | Disposition: A | Discharge status: Home / Self Care | Payer: Medicare Other | Source: Ambulatory Visit | Attending: Family Medicine | Admitting: Family Medicine

## 2022-05-13 DIAGNOSIS — Z1231 Encounter for screening mammogram for malignant neoplasm of breast: Secondary | ICD-10-CM | POA: Diagnosis not present

## 2022-05-15 ENCOUNTER — Telehealth: Payer: Self-pay | Admitting: Internal Medicine

## 2022-05-15 ENCOUNTER — Ambulatory Visit (INDEPENDENT_AMBULATORY_CARE_PROVIDER_SITE_OTHER): Payer: Medicare Other | Admitting: Internal Medicine

## 2022-05-15 ENCOUNTER — Encounter: Payer: Self-pay | Admitting: Internal Medicine

## 2022-05-15 VITALS — BP 108/62 | HR 78 | Temp 97.8°F | Ht 62.0 in | Wt 174.4 lb

## 2022-05-15 DIAGNOSIS — J4520 Mild intermittent asthma, uncomplicated: Secondary | ICD-10-CM | POA: Diagnosis not present

## 2022-05-15 NOTE — Progress Notes (Signed)
Formatting of this note is different from the original.  Images from the original note were not included.        Tracey Dawson    [id number]    12/14/1945    Primary Care Physician:Sowles, Danna Hefty, MD  Date of Appointment: 05/15/2022  Established Patient Visit    Chief complaint:    Chief Complaint   Patient presents with    Follow-up     Sob-same,pacing self more now     HPI:  Tracey Dawson is a 76 y.o. woman with history of PVD s/p CEA a well as AVR for AS s/p sternotomy.   Had worsening respiratory symptoms after covid infection.     Interval Updates:    A few months ago had a trip to [place] with friends. She did well compared to her peers. More recently she has experienced th death of close friend and stopped exercising after that. She feels she has been deconditioned. She has sold her home here and is thinking of moving back up [place] and is currently renting an apartment that has a lot of allergens (previous dogs etc.) she is moving to [place] to be closer to her son who just had a baby. Takes budesonide 3-4 times/aweek. Doesn't like albuterol because it makes her lungs burn.     Current Regimen:albuterol nebulizer prn, budesonide nebs  Asthma Triggers: exercise, allergies  ACT:   Asthma Control Test ACT Total Score   05/15/2022   9:00 AM 19   01/29/2021   9:44 AM 19     I have reviewed the patient's family social and past medical history and updated as appropriate.     Past Medical History:   Diagnosis Date    Allergy     Arthritis     Asthma     B12 deficiency anemia     Blood transfusion without reported diagnosis     H/O aortic valve replacement 2013    at [facility]     Heart murmur     History of colon polyps     History of kidney stones     HTN (hypertension)     Hyperlipidemia     Left nephrolithiasis     OSA (obstructive sleep apnea)     STUDY 12 YRS CPAP RX-- BUT NONTOLERANT    Right ureteral stone     S/P aortic valve replacement     2013  AT [facility]--- CARDIOLOGIST--  DR C[facility]WFORD [facility]    Seasonal  allergies     Sleep apnea     wears cpap     Statin intolerance     Type 2 diabetes mellitus (HCC)     type 2     Past Surgical History:   Procedure Laterality Date    AORTIC VALVE REPLACEMENT  06/2012   [facility]    APPENDECTOMY  1958    CARDIAC VALVE REPLACEMENT  2013    CHOLECYSTECTOMY  1978    COLONOSCOPY  05/09/2014    COLONOSCOPY      CYSTOSCOPY/RETROGRADE/URETEROSCOPY Right 07/04/2014    Procedure: CYSTOSCOPY/RETROGRADE/URETEROSCOPY;  Surgeon: Valetta Fuller, MD;  Location: [facility];  Service: Urology;  Laterality: Right;    ENDARTERECTOMY Left 08/05/2018    Procedure: ENDARTERECTOMY CAROTID LEFT;  Surgeon: Maeola Harman, MD;  Location: [facility] OR;  Service: Vascular;  Laterality: Left;    ESOPHAGOGASTRODUODENOSCOPY      EXTRACORPOREAL SHOCK WAVE LITHOTRIPSY Right 06/ 2015     [place]  EYE SURGERY      cataracts removed    HOLMIUM LASER APPLICATION Right 07/04/2014    Procedure: HOLMIUM LASER APPLICATION;  Surgeon: Valetta Fuller, MD;  Location: [facility];  Service: Urology;  Laterality: Right;    PATCH ANGIOPLASTY Left 08/05/2018    Procedure: PATCH ANGIOPLASTY USING XENOSURE BIOLOGIC PATCH 1cm x 6cm;  Surgeon: Maeola Harman, MD;  Location: [facility] OR;  Service: Vascular;  Laterality: Left;    POLYPECTOMY      TONSILLECTOMY  1953    UPPER GASTROINTESTINAL ENDOSCOPY       Family History   Problem Relation Age of Onset    Heart disease Mother     Heart disease Father     Asthma Daughter     Asthma Maternal Grandfather     Heart disease Sister         smoker    Heart attack Sister 17    Kidney cancer Sister     Cancer Sister     Heart disease Sister         smoker    Heart disease Sister         smoker    Stomach cancer Maternal Grandmother     Arthritis Maternal Grandmother     Cancer Maternal Grandmother     Cancer Maternal Aunt     Colon cancer Neg Hx     Esophageal cancer Neg Hx     Rectal cancer Neg Hx     Colon polyps Neg Hx      Social History      Occupational History    Occupation: Works for [employer]-retired     Employer: [employer]   Tobacco Use    Smoking status: Never    Smokeless tobacco: Never    Tobacco comments:     smoking cessation materials not required   Vaping Use    Vaping Use: Never used   Substance and Sexual Activity    Alcohol use: No    Drug use: No    Sexual activity: Not Currently     Physical Exam:  Blood pressure 108/62, pulse 78, temperature 97.8 F (36.6 C), temperature source Temporal, height  (1.575 m), weight 174 lb 6.4 oz (79.1 kg), SpO2 97 %.    Gen:      No acute distress  ENT:  no nasal polyps, mucus membranes moist  Lungs:    No increased respiratory effort, symmetric chest wall excursion, clear to auscultation bilaterally, no wheezes or crackles  CV:         Regular rate and rhythm; no murmurs, rubs, or gallops.  No pedal edema    Data Reviewed:  Imaging:  I have personally reviewed the     PFTs:       View : No data to display.           Labs:  Lab Results   Component Value Date    WBC 5.7 08/06/2021    HGB 13.5 08/06/2021    HCT 42.5 08/06/2021    MCV 91.2 08/06/2021    PLT 207 08/06/2021     Lab Results   Component Value Date    NA 138 02/05/2022    K 4.3 02/05/2022    CL 101 02/05/2022    CO2 29 02/05/2022     Immunization status:  Immunization History   Administered Date(s) Administered    Fluad Quad(high Dose 65+) 11/21/2021    PFIZER(Purple Top)SARS-COV-2  Vaccination 02/07/2020, 03/13/2020    Pneumococcal Conjugate-13 01/06/2014    Pneumococcal Polysaccharide-23 04/05/2010, 08/24/2015    Tdap 12/12/2006    Zoster, Live 01/06/2014     External Records Personally Reviewed: PCP, urgent care    Assessment:   Mild intermittent asthma    Plan/Recommendations:  Continue prn ICS for mild symptoms  Exercise as tolerated  Gave her recommendations for new pulmonary doctor in [place]   I am available to her and offered refills until she is able to establish.     Return to Care:  Return if symptoms  worsen or fail to improve.    Durel SaltsNikita Desai, MD  Pulmonary and Critical Care Medicine  [facility]  Office:[phone]      Electronically signed by Charlott Holleresai, Nikita S, MD at 05/15/2022  9:35 AM EDT

## 2022-05-15 NOTE — Progress Notes (Signed)
Bailey [name]    [id number]    04-24-1946  Primary Care Physician:Sowles, Drue Stager, MD Date of Appointment: 05/15/2022 Established Patient Visit  Chief complaint:   Chief Complaint  Patient presents with   Follow-up    Sob-same,pacing self more now     HPI: Bailey [name] is a 76 y.o. woman with history of PVD s/p CEA a well as AVR for AS s/p sternotomy.  Had worsening respiratory symptoms after covid infection.   Interval Updates:  A few months ago had a trip to [place] with friends. She did well compared to her peers. More recently she has experienced th death of close friend and stopped exercising after that. She feels she has been deconditioned. She has sold her home here and is thinking of moving back up [place] and is currently renting an apartment that has a lot of allergens (previous dogs etc.) she is moving to [place] to be closer to her son who just had a baby. Takes budesonide 3-4 times/aweek. Doesn't like albuterol because it makes her lungs burn.   Current Regimen:albuterol nebulizer prn, budesonide nebs Asthma Triggers: exercise, allergies ACT:  Asthma Control Test ACT Total Score  05/15/2022  9:00 AM 19  01/29/2021  9:44 AM 19    I have reviewed the patient's family social and past medical history and updated as appropriate.   Past Medical History:  Diagnosis Date   Allergy    Arthritis    Asthma    B12 deficiency anemia    Blood transfusion without reported diagnosis    H/O aortic valve replacement 2013   at [place] murmur    History of colon polyps    History of kidney stones    HTN (hypertension)    Hyperlipidemia    Left nephrolithiasis    OSA (obstructive sleep apnea)    STUDY 12 YRS CPAP RX-- BUT NONTOLERANT   Right ureteral stone    S/P aortic valve replacement    2013  AT [facility]--- CARDIOLOGIST--  [facility]   Seasonal allergies    Sleep apnea    wears cpap    Statin intolerance    Type 2 diabetes  mellitus ([place])    type 2    Past Surgical History:  Procedure Laterality Date   AORTIC VALVE REPLACEMENT  06/2012   [place] VALVE REPLACEMENT  2013   CHOLECYSTECTOMY  1978   COLONOSCOPY  05/09/2014   COLONOSCOPY     CYSTOSCOPY/RETROGRADE/URETEROSCOPY Right 07/04/2014   Procedure: CYSTOSCOPY/RETROGRADE/URETEROSCOPY;  Surgeon: Bernestine Amass, MD;  Location: [facility];  Service: Urology;  Laterality: Right;   ENDARTERECTOMY Left 08/05/2018   Procedure: ENDARTERECTOMY CAROTID LEFT;  Surgeon: Waynetta Sandy, MD;  Location: [place];  Service: Vascular;  Laterality: Left;   ESOPHAGOGASTRODUODENOSCOPY     EXTRACORPOREAL SHOCK WAVE LITHOTRIPSY Right 06/ 2015     [place]   EYE SURGERY     cataracts removed   HOLMIUM LASER APPLICATION Right 41/63/8453   Procedure: HOLMIUM LASER APPLICATION;  Surgeon: Bernestine Amass, MD;  Location: [facility];  Service: Urology;  Laterality: Right;   PATCH ANGIOPLASTY Left 08/05/2018   Procedure: PATCH ANGIOPLASTY USING XENOSURE BIOLOGIC PATCH 1cm x 6cm;  Surgeon: Waynetta Sandy, MD;  Location: [place];  Service: Vascular;  Laterality: Left;   [place]  UPPER GASTROINTESTINAL ENDOSCOPY      Family History  Problem Relation Age of Onset   Heart disease Mother    Heart disease Father    Asthma Daughter    Asthma Maternal Grandfather    Heart disease Sister        smoker   Heart attack Sister 41   Kidney cancer Sister    Cancer Sister    Heart disease Sister        smoker   Heart disease Sister        smoker   Stomach cancer Maternal Grandmother    Arthritis Maternal Grandmother    Cancer Maternal Grandmother    Cancer Maternal Aunt    Colon cancer Neg Hx    Esophageal cancer Neg Hx    Rectal cancer Neg Hx    Colon polyps Neg Hx     Social History   Occupational History   Occupation: Works for [employer]-retired     Employer: [employer]  Tobacco Use   Smoking status: Never   Smokeless tobacco: Never   Tobacco comments:    smoking cessation materials not required  Vaping Use   Vaping Use: Never used  Substance and Sexual Activity   Alcohol use: No   Drug use: No   Sexual activity: Not Currently     Physical Exam: Blood pressure 108/62, pulse 78, temperature 97.8 F (36.6 C), temperature source Temporal, height '5\' 2"'$  (1.575 m), weight 174 lb 6.4 oz (79.1 kg), SpO2 97 %.  Gen:      No acute distress ENT:  no nasal polyps, mucus membranes moist Lungs:    No increased respiratory effort, symmetric chest wall excursion, clear to auscultation bilaterally, no wheezes or crackles CV:         Regular rate and rhythm; no murmurs, rubs, or gallops.  No pedal edema   Data Reviewed: Imaging: I have personally reviewed the   PFTs:      View : No data to display.           Labs: Lab Results  Component Value Date   WBC 5.7 08/06/2021   HGB 13.5 08/06/2021   HCT 42.5 08/06/2021   MCV 91.2 08/06/2021   PLT 207 08/06/2021   Lab Results  Component Value Date   NA 138 02/05/2022   K 4.3 02/05/2022   CL 101 02/05/2022   CO2 29 02/05/2022    Immunization status: Immunization History  Administered Date(s) Administered   Fluad Quad(high Dose 65+) 11/21/2021   PFIZER(Purple Top)SARS-COV-2 Vaccination 02/07/2020, 03/13/2020   Pneumococcal Conjugate-13 01/06/2014   Pneumococcal Polysaccharide-23 04/05/2010, 08/24/2015   Tdap 12/12/2006   Zoster, Live 01/06/2014    External Records Personally Reviewed: PCP, urgent care  Assessment:  Mild intermittent asthma  Plan/Recommendations: Continue prn ICS for mild symptoms Exercise as tolerated Gave her recommendations for new pulmonary doctor in [place]  I am available to her and offered refills until she is able to establish.      Return to Care: Return if symptoms worsen or fail to improve.   Lenice Llamas, MD Pulmonary  and [place]

## 2022-05-15 NOTE — Patient Instructions (Signed)
Good luck with your move to [place]!   You can try calling [facility] in [place] and see if they are taking new patients. I can recommend Dr. Marikay Alar.   Please let me know if you need anything else before you're able to establish with a new doctor.

## 2022-05-16 NOTE — Telephone Encounter (Signed)
Printed off office notes from the only two times patient has been seen. Called and told patient I was faxing information to the number provided. Patient verbalized understanding. Nothing further needed

## 2022-05-23 NOTE — Progress Notes (Signed)
Called and spoke with patient. Patient would like to see Dr. Timothy Lasso. But is unable to do first week of Aug. Patient will call back in July for Aug/Sept schedule. Letter sent to patient to call and schedule a new patient consultation with the first available physician.    Referred by: [facility] in [place]    Diagnosis: ASTHMA    Records scanned into media

## 2022-07-02 DIAGNOSIS — L814 Other melanin hyperpigmentation: Secondary | ICD-10-CM | POA: Diagnosis not present

## 2022-07-02 DIAGNOSIS — L821 Other seborrheic keratosis: Secondary | ICD-10-CM | POA: Diagnosis not present

## 2022-07-02 DIAGNOSIS — D485 Neoplasm of uncertain behavior of skin: Secondary | ICD-10-CM | POA: Diagnosis not present

## 2022-07-02 DIAGNOSIS — Z85828 Personal history of other malignant neoplasm of skin: Secondary | ICD-10-CM | POA: Diagnosis not present

## 2022-07-02 DIAGNOSIS — L820 Inflamed seborrheic keratosis: Secondary | ICD-10-CM | POA: Diagnosis not present

## 2022-07-02 DIAGNOSIS — L728 Other follicular cysts of the skin and subcutaneous tissue: Secondary | ICD-10-CM | POA: Diagnosis not present

## 2022-07-02 DIAGNOSIS — R208 Other disturbances of skin sensation: Secondary | ICD-10-CM | POA: Diagnosis not present

## 2022-07-02 DIAGNOSIS — L538 Other specified erythematous conditions: Secondary | ICD-10-CM | POA: Diagnosis not present

## 2022-07-02 DIAGNOSIS — Z08 Encounter for follow-up examination after completed treatment for malignant neoplasm: Secondary | ICD-10-CM | POA: Diagnosis not present

## 2022-07-02 DIAGNOSIS — D225 Melanocytic nevi of trunk: Secondary | ICD-10-CM | POA: Diagnosis not present

## 2022-07-02 DIAGNOSIS — L570 Actinic keratosis: Secondary | ICD-10-CM | POA: Diagnosis not present

## 2022-07-02 DIAGNOSIS — L298 Other pruritus: Secondary | ICD-10-CM | POA: Diagnosis not present

## 2022-08-02 ENCOUNTER — Ambulatory Visit: Payer: Medicare Other | Admitting: Family Medicine

## 2022-08-27 ENCOUNTER — Institutional Professional Consult (permissible substitution): Admit: 2022-08-27 | Discharge: 2022-08-27 | Payer: MEDICARE | Attending: Critical Care Medicine

## 2022-08-27 DIAGNOSIS — J4520 Mild intermittent asthma, uncomplicated: Secondary | ICD-10-CM

## 2022-08-27 MED ORDER — PREDNISONE 10 MG PO TABS
10 MG | ORAL_TABLET | ORAL | 0 refills | Status: AC
Start: 2022-08-27 — End: ?

## 2022-08-27 MED ORDER — UNABLE TO FIND
0 refills | Status: AC
Start: 2022-08-27 — End: ?

## 2022-08-27 NOTE — Progress Notes (Signed)
PULMONARY CONSULTATION    Reason for Consultation: asthma   Referring Physician: Alba Cory     Communication with the referring physician will be sent via the electronic medical record.    HPI:    The patient is a 76 year old female referred for evaluation of asthma.  She was formerly following with Dr. Celine Mans for pulmonary conditions.    She states that she has been doing very well until last week when she developed an upper respiratory infection.  She states that she started having coughing (non productive) and dyspnea.  She has wheezing as well.  She received a course of prednisone and tessalon.  She was given antibiotics but does not want to take it.   Her asthma is typically very well controlled unless she is sick or has allergies.   She uses prn albuterol and also does have prn budesonide.      Worked as a IT sales professional     Respiratory meds:  Albuterol nebs  Budesonide nebs      PMH/PSH  PAD s/p CEA  AS s/p AVR  Hypertension\  OSA intolerant to  PAP  Allergies  Diabetes   Nephrolithiasis     Taken from outside notes:  AORTIC VALVE REPLACEMENT  06/2012   [facility]    APPENDECTOMY  1958    CARDIAC VALVE REPLACEMENT  2013    CHOLECYSTECTOMY  1978    COLONOSCOPY  05/09/2014    COLONOSCOPY      CYSTOSCOPY/RETROGRADE/URETEROSCOPY Right 07/04/2014    Procedure: CYSTOSCOPY/RETROGRADE/URETEROSCOPY;  Surgeon: Valetta Fuller, MD;  Location: [facility];  Service: Urology;  Laterality: Right;    ENDARTERECTOMY Left 08/05/2018    Procedure: ENDARTERECTOMY CAROTID LEFT;  Surgeon: Maeola Harman, MD;  Location: [facility] OR;  Service: Vascular;  Laterality: Left;    ESOPHAGOGASTRODUODENOSCOPY      EXTRACORPOREAL SHOCK WAVE LITHOTRIPSY Right 06/ 2015     [place]    EYE SURGERY      cataracts removed    HOLMIUM LASER APPLICATION Right 07/04/2014    Procedure: HOLMIUM LASER APPLICATION;  Surgeon: Valetta Fuller, MD;  Location: [facility]  Service: Urology;  Laterality: Right;     PATCH ANGIOPLASTY Left 08/05/2018    Procedure: PATCH ANGIOPLASTY USING XENOSURE BIOLOGIC PATCH 1cm x 6cm;  Surgeon: Maeola Harman, MD;  Location: [facility] OR;  Service: Vascular;  Laterality: Left;    POLYPECTOMY      TONSILLECTOMY  1953    UPPER GASTROINTESTINAL ENDOSCOPY       History reviewed. No pertinent family history.    Social History     Socioeconomic History    Marital status: Divorced     Spouse name: Not on file    Number of children: Not on file    Years of education: Not on file    Highest education level: Not on file   Occupational History    Not on file   Tobacco Use    Smoking status: Never    Smokeless tobacco: Never   Vaping Use    Vaping Use: Never used   Substance and Sexual Activity    Alcohol use: Never    Drug use: Never    Sexual activity: Not Currently   Other Topics Concern    Not on file   Social History Narrative    Not on file     Social Determinants of Health     Financial Resource Strain: Not on file  Food Insecurity: Not on file   Transportation Needs: Not on file   Physical Activity: Not on file   Stress: Not on file   Social Connections: Not on file   Intimate Partner Violence: Not on file   Housing Stability: Not on file       Current Outpatient Medications   Medication Sig Dispense Refill    Magnesium Gluconate (MAGNESIUM 27 PO) Take by mouth      B Complex CAPS Take by mouth      Krill Oil 1000 MG CAPS Take 1 capsule by mouth daily as needed      ascorbic acid (VITAMIN C) 100 MG tablet Take 1 tablet by mouth daily      Cholecalciferol 50 MCG (2000 UT) TABS Take 1 tablet by mouth daily      Melatonin 12 MG TABS Take by mouth at bedtime      Menatetrenone (VITAMIN K2) 100 MCG TABS Take by mouth      budesonide (PULMICORT) 0.5 MG/2ML nebulizer suspension Inhale 2 mLs into the lungs 2 times daily      albuterol sulfate HFA (PROVENTIL;VENTOLIN;PROAIR) 108 (90 Base) MCG/ACT inhaler Inhale 2 puffs into the lungs every 6 hours as needed      albuterol (PROVENTIL) (2.5 MG/3ML)  0.083% nebulizer solution Inhale 3 mLs into the lungs in the morning and 3 mLs at noon and 3 mLs in the evening and 3 mLs before bedtime.      Coenzyme Q10 (COQ10) 150 MG CAPS Take 150 mg by mouth 2 times daily       No current facility-administered medications for this visit.       Allergies   Allergen Reactions    Hydromorphone Other (See Comments)     B/P dropped  Severe hypotension - pt states that if she is given this medication it will kill her  B/P dropped  Dilaudid    Morphine And Related Other (See Comments) and Shortness Of Breath     Pain with IM injection  B/P dropped  Severe hypotension - pt states that if she is given this medication it will kill her  B/P dropped  Pain with IM injection  Severe hypotension - pt states that if she is given this medication it will kill her  Pain with IM injection      Naproxen Shortness Of Breath    Nsaids Other (See Comments) and Shortness Of Breath     Causes asthma  Causes asthma      Oxycodone-Acetaminophen Shortness Of Breath     Can tolerate oxy IR  Can tolerate oxy IR  Can tolerate oxy IR  Can tolerate oxy IR  Can tolerate oxy IR  Can tolerate oxy IR  Can tolerate oxy IR  Can tolerate oxy IR      Sulfa Antibiotics Hives, Rash and Shortness Of Breath    Rosuvastatin Other (See Comments)     myalgia  myalgia  myalgia  myalgia         Review of Systems:  14 point ROS obtained and neg aside from outlined in the HPI    Physical Exam:  BP 124/73 (Site: Left Upper Arm, Position: Sitting)   Pulse 88   Temp 96.9 F (36.1 C) (Temporal)   Resp 18   Ht 5' 1.5" (1.562 m)   Wt 168 lb 6.4 oz (76.4 kg)   SpO2 94%   BMI 31.30 kg/m    General: The patient is sitting in the exam room  comfortably without any distress.  Breathing is not labored  HEENT: Pupils are equal round and reactive to light, there are no oral lesions and no post-nasal drip   Neck: supple without adenopathy  Cardiovascular: regular rate and rhythm without murmur or gallop  Respiratory: exp wheezing with  coughing   Abdomen: soft, non-tender, non-distended, normal bowel sounds  Extremities: warm, no edema, no clubbing  Skin: no rash or lesion  Neurologic: CN II-XII grossly intact, no focal deficits    Oximetry: 94% on RA     No PFTs and imaging for review     Labs:  Lab Results   Component Value Date/Time    WBC 20.3 07/20/2021 07:26 PM    RBC 4.59 07/20/2021 07:26 PM    HGB 13.8 07/20/2021 07:26 PM    HCT 42.5 07/20/2021 07:26 PM    MCV 92.6 07/20/2021 07:26 PM    MCH 30.1 07/20/2021 07:26 PM    MCHC 32.5 07/20/2021 07:26 PM    RDW 14.6 07/20/2021 07:26 PM    PLT 189 07/20/2021 07:26 PM    MPV 10.5 07/20/2021 07:26 PM     Lab Results   Component Value Date/Time    NA 141 07/20/2021 07:26 PM    K 4.1 07/20/2021 07:26 PM    CL 101 07/20/2021 07:26 PM    CO2 26 07/20/2021 07:26 PM    BUN 27 07/20/2021 07:26 PM    CREATININE 0.8 07/20/2021 07:26 PM    LABALBU 4.5 07/20/2021 07:26 PM    CALCIUM 9.8 07/20/2021 07:26 PM    GFRAA >60 07/20/2021 07:26 PM    LABGLOM >60 07/20/2021 07:26 PM     No results found for: "PROTIME", "INR"        Assessment:    Mild intermittent asthma with acute exacerbation     Acute cough    Viral upper respiratory tract infection    Plan:  Prednisone taper  Tessalon prn  Prn albuterol nebs - she also does have prn budesonide nebs  UTD vaccines - Rx for prevnar 20     RTC 6 mo with PFT       Thank you for allowing me to participate in the care of Tracey Dawson.       Deirdre Pippins, MD  08/27/2022 11:55 AM

## 2022-10-25 ENCOUNTER — Telehealth

## 2022-10-25 MED ORDER — ALBUTEROL SULFATE HFA 108 (90 BASE) MCG/ACT IN AERS
108 (90 Base) MCG/ACT | Freq: Four times a day (QID) | RESPIRATORY_TRACT | 5 refills | Status: AC | PRN
Start: 2022-10-25 — End: ?

## 2022-10-25 NOTE — Telephone Encounter (Signed)
Albuterol sulfate HFA electronically sent to requested pharmacy

## 2022-10-25 NOTE — Telephone Encounter (Signed)
Tracey Dawson is calling in for an albuterol rescue inhaler. She was on a trip and lost hers.    Please advise  Pharm attached

## 2023-01-14 ENCOUNTER — Other Ambulatory Visit: Payer: Self-pay | Admitting: Family Medicine

## 2023-02-06 ENCOUNTER — Ambulatory Visit: Payer: Medicare Other

## 2024-04-05 NOTE — Progress Notes (Signed)
 [facility] CARDIOVASCULAR MEDICINE PROGRESS NOTE    Bailey [name] [id number]  PRIMARY SERVICE:   A, Hvi Clinical Np/Pa  For communication with primary team please see treatment team for covering provider and page them directly or contact the NP/PA service pager [phone]  For communication after 5 pm on weekdays and after 12 pm on weekends, please page the following: - paged Cardiology Hospitalist at [phone]    HOSPITAL DAY:  # 6   INTERVAL HISTORY  Alert and oriented Offers no complaints On RA Stopped lasix gtt last night - restart diuretics as needed post- op OR today  Pulmonology consult per CTS for preop evaluation, appreciate rec's   Rhythm: SR  Intake/Output Summary (Last 24 hours) at 04/05/2024 1349 Last data filed at 04/05/2024 1210 Gross per 24 hour  Intake 1520 ml  Output 1000 ml  Net 520 ml    EKG:  Most recent reviewed TELE:   most recent recordings reviewed  PHYSICAL EXAM: BP 97/57   Pulse 83   Temp 36.4 C (97.6 F) (Oral)   Resp 20   Ht 156.2 cm (5' 1.5)   Wt 66.1 kg (145 lb 11.6 oz)   LMP  (LMP Unknown)   SpO2 95%   BMI 27.09 kg/m  Physical Exam Performed: GENERAL:  : resting comfortably in bed, alert and in no acute distress HEART:  : RRR, + murmur LUNGS:  : breath sounds clear and normal, bilaterally, RA ABDOMEN: : Soft, nontender, bowel sounds normal EXTREMITY: :  trace LE edema NEURO: Grossly normal cognition, motor function PULSES: 2+ radial    ALLERGIES  Allergen Reactions  . Hydromorphone (Bulk) Anaphylaxis    Dilaudid  . Metoprolol            Shortness of Breath, Other: See Comments    Chest heaviness  . Naproxen             Shortness of Breath  . Oxycodone -Acetamino* Shortness of Breath  . Sulfa (Sulfonamide * Hives, Rash, Shortness of Breath  . Nsaids (Non-Steroid* Intolerance, Shortness of Breath  . Rosuvastatin         Intolerance, Myalgia    MEDICATIONS  Current Facility-Administered Medications   Medication Dose Route Frequency  . [Transfer Hold] aspirin  81 mg chewable tab(s)  81 mg ORAL DAILY  . [Transfer Hold] albuterol  HFA 90 mcg/actuation 2 puff (PROVENTIL  HFA, VENTOLIN  HFA)  2 puff INHALATION q 4 H PRN  . [Transfer Hold] guaiFENesin  600 mg ER tab(s) (MUCINEX )  600 mg ORAL q 12 H  . [Transfer Hold] acetaminophen  1,000 mg tab(s) (TYLENOL )  1,000 mg ORAL q 8 H PRN  . [Transfer Hold] melatonin 1 mg tab(s)  1 mg ORAL DAILY (8 PM)  . [Transfer Hold] polyethylene glycol 3350  17 g packet  17 g ORAL DAILY  . [Transfer Hold] NaCl 0.9% iv flush bag  20 mL INTRAVENOUS PRN  . [Transfer Hold] senna 8.6 mg tab(s) (SENOKOT)  8.6 mg ORAL BID  . [Transfer Hold] heparin  5,000 Units injection  5,000 Units SUBCUTANEOUS q 12 H  . [Transfer Hold] docusate sodium  100 mg cap(s) (COLACE)  100 mg ORAL BID PRN  . [Transfer Hold] magnesium  oxide 400 mg tab(s) (MAG-OX)  400 mg ORAL BID  . [Transfer Hold] Chlorhexidine  Gluconate 0.12 % 15 mL (PERIDEX )  15 mL ORAL q 6 H  . [Transfer Hold] ipratropium-albuterol  3 mL nebulizer solution (DUONEB)  3 mL INHALATION q 4 H while awake  . heparin  10,000 Units in electrolyte-a  500 mL    X (OR/PROCEDURE) PRN  . gentamicin 40 mg/mL injection    X (OR/PROCEDURE) PRN  . vancomycin injection    X (OR/PROCEDURE) PRN   Facility-Administered Medications Ordered in Other Encounters  Medication Dose Route Frequency  . midazolam  injection (VERSED )   INTRAVENOUS PRN  . ondansetron  (PF) injection (ZOFRAN )   INTRAVENOUS PRN  . fentaNYL  50 mcg/mL injection (SUBLIMAZE )   INTRAVENOUS PRN  . lactated ringers  iv infusion   INTRAVENOUS X (ONE-STEP ONLY) CONTINUOUS PRN  . aztreonam in D5W 100 mL Vial-Bag (AZACTAM)   INTRAVENOUS PRN  . vancomycin in NaCl 0.9% 250 mL Vial-Bag (VANCOCIN)   INTRAVENOUS PRN  . epoprostenol INHALATION 1.5 mg in NaCl 0.9% 50 mL syringe (VELETRI)   INHALATION X (ONE-STEP ONLY) CONTINUOUS PRN  . EPINEPHrine 4 mg in D5W 250 mL   INTRAVENOUS X (ONE-STEP ONLY)  CONTINUOUS PRN  . vasopressin 20 units in D5W 100 mL (VASOSTRICT)   INTRAVENOUS X (ONE-STEP ONLY) CONTINUOUS PRN  . etomidate injection (AMIDATE)   INTRAVENOUS PRN  . succinylcholine injection (QUELICIN)   INTRAVENOUS PRN  . rocuronium  injection   INTRAVENOUS PRN  . NORepinephrine injection syringe (LEVOPHED)   INTRAVENOUS PRN  . nitroglycerin injection syringe   INTRAVENOUS PRN  . NaCl 0.9% iv infusion   INTRAVENOUS X (ONE-STEP ONLY) CONTINUOUS PRN  . tranexamic acid (CYKLOKAPRON) in NaCl 0.7%   INTRAVENOUS PRN  . insulin  regular 100 units in NaCl 0.9% 100 mL - DKA/HYPERGLYCEMIA NOMOGRAM   INTRAVENOUS X (ONE-STEP ONLY) CONTINUOUS PRN  . heparin  1,000 unit/mL injection   INTRAVENOUS PRN    DATA Recent Labs    04/05/24 0559 04/04/24 0706 04/03/24 0701  WBC 5.88 4.91 4.89  HB 11.7 12.2 12.5  HCT 35.8* 37.4 39.2  PLT 178 192 207   Recent Labs    04/05/24 0559 04/04/24 0706 04/03/24 0702  NA 138 138 137  K 4.5 4.3 3.7  CO2 26 31* 29  BUN 33* 30* 27*  CREAT 0.98* 0.86 0.83  GLUC 106* 98 94  MG  --  2.2 2.2   Recent Echocardiograms     ECHO  Collected: 03/10/2024 12:49 PM (Final result)  Narrative:     Echocardiography Report: Transthoracic Echo... Impression: CONCLUSIONS: - Technically difficult exam due to body habitus. - Exam indication: Evaluation of prosthetic valve with suspected dysfunction - The left ventricle is normal in size. Left ventricular systolic function is ...           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 Signa for expedited AVR & MVR  evaluation  Patient was previously determined by [facility] not to be a candidate for AVR/MVR given severe pHTN, or a candidate for M-TEER given small valve area and MAC. She established care with Dr. Signa for 2nd opinion. She underwent an outpatient ECHO that showed LVEF 64%, severe MR due to restricted leaflet motion with mild to moderate MS (gradients 21/9 mmHg @ 91 bpm) and severe bioprosthetic AS (gradients 76/40 mmHg, DI 0.24). It was recommended she be directly admitted for expedited OHS evaluation.   HOSPITAL COURSE:  Patient admitted to the [facility] in stable condition. CTS consulted for surgical evaluation. She was noted to be grossly volume overloaded with NT pro BNP 7886 and + JVD. She was started on IV Lasix for diuresis with suboptimal response. She was then started on Lasix drip 4/24 for more aggressive diuresis. Volume statu improved. Awaiting OR.   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 - Last RHC @ [facility] 01/16/24: RA 10, PA 92/32 (57), PCWP 24 (v waves to 34), FICK CO 3.4 - Home meds: Bumex 4mg  BID, Spironolactone 25mg  daily Assessment: Hypervolemic on exam with NT pro BNP 7886 and + JVD - TTE: LVEF 64%, severe MR due to restricted leaflet motion with mild to moderate MS (gradients 21/9 mmHg @ 91 bpm) and severe bioprosthetic AS (gradients 76/40 mmHg, DI 0.24) Plan:  Restart diuretics as needed post-op HH diet, strict I&Os, daily standing weights HF education prior to discharge   #Nonobstructive CAD #HLD History: LHC @ [facility] 01/05/24: mild to moderate non-obstructive CAD (30-40% mid LAD, 40% distal LCx, 60% RI, 50% mid RCA) Assessment: Currently denies chest  pain LDL: 131 Plan:  Continue ASA Intolerant to statins, consider [id number] as an outpatient Borderline CI so cautious use of any BB Monitor for chest pain  #OSA #Severe pHTN #Loculated R Pleural Effusion History: PFT's completed  Assessment: improved sob with diuresis Plan:  Pulmonology consult for preop evaluation per CTS -->added duonebs, incentive   For all active hospital problems BELOW: see history assessment and plan above in Care Coordination note above       Active Hospital Problems   Diagnosis  . Preoperative respiratory examination  . Nonrheumatic mitral valve stenosis with insufficiency  . Acute on chronic heart failure with preserved ejection fraction (HFpEF) (HCC)  . SVT (supraventricular tachycardia) (HCC)  . OSA (obstructive sleep apnea)  . Severe mitral regurgitation  . Aortic valve stenosis  . S/P AVR  . HTN (hypertension)  . HLD (hyperlipidemia)  . Pulmonary hypertension, unspecified (HCC)     Patient discussed with Dr. Linde Iha L. Marilynne CHOL, APRN-CNP Cardiovascular Medicine Nurse Practitioner PAGER: [phone] 04/05/24 09:52am
